# Patient Record
Sex: Male | Born: 1960
Health system: Southern US, Community
[De-identification: ages and names within clinical notes are randomized; demographics above are authoritative.]

## PROBLEM LIST (undated history)

## (undated) DIAGNOSIS — I10 Essential (primary) hypertension: Secondary | ICD-10-CM

## (undated) DIAGNOSIS — I519 Heart disease, unspecified: Secondary | ICD-10-CM

## (undated) DIAGNOSIS — I209 Angina pectoris, unspecified: Secondary | ICD-10-CM

## (undated) DIAGNOSIS — E7800 Pure hypercholesterolemia, unspecified: Secondary | ICD-10-CM

## (undated) DIAGNOSIS — M1990 Unspecified osteoarthritis, unspecified site: Secondary | ICD-10-CM

## (undated) DIAGNOSIS — M359 Systemic involvement of connective tissue, unspecified: Secondary | ICD-10-CM

## (undated) DIAGNOSIS — G4730 Sleep apnea, unspecified: Secondary | ICD-10-CM

## (undated) HISTORY — PX: APPENDECTOMY: SHX54

## (undated) HISTORY — PX: OTHER SURGICAL HISTORY: SHX169

## (undated) HISTORY — PX: EYE SURGERY: SHX253

## (undated) HISTORY — PX: CARDIAC CATHETERIZATION: SHX172

## (undated) HISTORY — DX: Heart disease, unspecified: I51.9

---

## 2004-02-20 ENCOUNTER — Inpatient Hospital Stay (HOSPITAL_COMMUNITY): Admission: EM | Admit: 2004-02-20 | Discharge: 2004-02-22 | Payer: Self-pay | Admitting: *Deleted

## 2004-03-01 ENCOUNTER — Observation Stay (HOSPITAL_COMMUNITY): Admission: EM | Admit: 2004-03-01 | Discharge: 2004-03-01 | Payer: Self-pay

## 2005-05-27 ENCOUNTER — Emergency Department: Payer: Self-pay | Admitting: Emergency Medicine

## 2005-11-01 ENCOUNTER — Encounter: Admission: RE | Admit: 2005-11-01 | Discharge: 2005-11-01 | Payer: Self-pay | Admitting: Family Medicine

## 2008-02-21 ENCOUNTER — Emergency Department: Payer: Self-pay | Admitting: Emergency Medicine

## 2008-08-08 ENCOUNTER — Emergency Department: Payer: Self-pay | Admitting: Emergency Medicine

## 2008-08-29 ENCOUNTER — Emergency Department: Payer: Self-pay | Admitting: Emergency Medicine

## 2009-02-13 ENCOUNTER — Inpatient Hospital Stay: Payer: Self-pay | Admitting: Psychiatry

## 2010-10-08 ENCOUNTER — Inpatient Hospital Stay (HOSPITAL_COMMUNITY): Admission: EM | Admit: 2010-10-08 | Discharge: 2010-10-09 | Payer: Self-pay | Admitting: Emergency Medicine

## 2011-03-14 LAB — BASIC METABOLIC PANEL
BUN: 13 mg/dL (ref 6–23)
BUN: 9 mg/dL (ref 6–23)
CO2: 23 mEq/L (ref 19–32)
CO2: 26 mEq/L (ref 19–32)
Calcium: 8.4 mg/dL (ref 8.4–10.5)
Calcium: 8.6 mg/dL (ref 8.4–10.5)
Chloride: 102 mEq/L (ref 96–112)
Chloride: 107 mEq/L (ref 96–112)
Creatinine, Ser: 0.89 mg/dL (ref 0.4–1.5)
Creatinine, Ser: 0.92 mg/dL (ref 0.4–1.5)
GFR calc Af Amer: >60 mL/min (ref >60)
GFR calc Af Amer: >60 mL/min (ref >60)
GFR calc non Af Amer: >60 mL/min (ref >60)
GFR calc non Af Amer: >60 mL/min (ref >60)
Glucose, Bld: 101 mg/dL — ABNORMAL HIGH (ref 70–99)
Glucose, Bld: 123 mg/dL — ABNORMAL HIGH (ref 70–99)
Potassium: 3.2 mEq/L — ABNORMAL LOW (ref 3.5–5.1)
Potassium: 4 mEq/L (ref 3.5–5.1)
Sodium: 134 mEq/L — ABNORMAL LOW (ref 135–145)
Sodium: 139 mEq/L (ref 135–145)

## 2011-03-14 LAB — DIFFERENTIAL
Basophils Absolute: 0 10*3/uL (ref 0.0–0.1)
Basophils Relative: 1 % (ref 0–1)
Eosinophils Absolute: 0.3 10*3/uL (ref 0.0–0.7)
Eosinophils Relative: 4 % (ref 0–5)
Lymphocytes Relative: 32 % (ref 12–46)
Lymphs Abs: 2.5 10*3/uL (ref 0.7–4.0)
Monocytes Absolute: 0.6 10*3/uL (ref 0.1–1.0)
Monocytes Relative: 7 % (ref 3–12)
Neutro Abs: 4.4 10*3/uL (ref 1.7–7.7)
Neutrophils Relative %: 57 % (ref 43–77)

## 2011-03-14 LAB — CBC
HCT: 39.8 % (ref 39.0–52.0)
Hemoglobin: 14 g/dL (ref 13.0–17.0)
MCH: 33.4 pg (ref 26.0–34.0)
MCHC: 35.1 g/dL (ref 30.0–36.0)
MCV: 95 fL (ref 78.0–100.0)
Platelets: 244 10*3/uL (ref 150–400)
RBC: 4.19 MIL/uL — ABNORMAL LOW (ref 4.22–5.81)
RDW: 13.3 % (ref 11.5–15.5)
WBC: 7.7 10*3/uL (ref 4.0–10.5)

## 2011-03-14 LAB — LIPID PANEL
Cholesterol: 176 mg/dL (ref 0–200)
HDL: 37 mg/dL — ABNORMAL LOW (ref >39)
LDL Cholesterol: 63 mg/dL (ref 0–99)
Total CHOL/HDL Ratio: 4.8 RATIO
Triglycerides: 380 mg/dL — ABNORMAL HIGH (ref <150)
VLDL: 76 mg/dL — ABNORMAL HIGH (ref 0–40)

## 2011-03-14 LAB — D-DIMER, QUANTITATIVE: D-Dimer, Quant: 0.35 ug/mL-FEU (ref 0.00–0.48)

## 2011-03-14 LAB — POCT CARDIAC MARKERS
CKMB, poc: 1.2 ng/mL (ref 1.0–8.0)
CKMB, poc: 1.3 ng/mL (ref 1.0–8.0)
Myoglobin, poc: 50.7 ng/mL (ref 12–200)
Myoglobin, poc: 55.5 ng/mL (ref 12–200)
Troponin i, poc: <0.05 ng/mL (ref 0.00–0.09)
Troponin i, poc: <0.05 ng/mL (ref 0.00–0.09)

## 2011-03-14 LAB — CARDIAC PANEL(CRET KIN+CKTOT+MB+TROPI)
CK, MB: 2.7 ng/mL (ref 0.3–4.0)
CK, MB: 2.8 ng/mL (ref 0.3–4.0)
Relative Index: 2.3 (ref 0.0–2.5)
Relative Index: 2.5 (ref 0.0–2.5)
Total CK: 110 U/L (ref 7–232)
Total CK: 122 U/L (ref 7–232)
Troponin I: 0.01 ng/mL (ref 0.00–0.06)
Troponin I: <0.01 ng/mL (ref 0.00–0.06)

## 2011-03-14 LAB — RAPID URINE DRUG SCREEN, HOSP PERFORMED
Amphetamines: NOT DETECTED
Barbiturates: NOT DETECTED
Benzodiazepines: NOT DETECTED
Cocaine: NOT DETECTED
Opiates: NOT DETECTED
Tetrahydrocannabinol: NOT DETECTED

## 2011-03-14 LAB — CK TOTAL AND CKMB (NOT AT ARMC)
CK, MB: 3.9 ng/mL (ref 0.3–4.0)
Relative Index: 2.2 (ref 0.0–2.5)
Total CK: 176 U/L (ref 7–232)

## 2011-03-14 LAB — TROPONIN I: Troponin I: 0.01 ng/mL (ref 0.00–0.06)

## 2011-03-14 LAB — HEMOGLOBIN A1C
Hgb A1c MFr Bld: 5.4 % (ref <5.7)
Mean Plasma Glucose: 108 mg/dL (ref <117)

## 2011-05-17 NOTE — H&P (Signed)
NAME:  Daniel Crane, Daniel Crane                           ACCOUNT NO.:  [id number]   MEDICAL RECORD NO.:  [id number]                   PATIENT TYPE:  OBV   LOCATION:  6527                                 FACILITY:  [facility]   PHYSICIAN:  Quita Skye. Waldon Reining, MD             DATE OF BIRTH:  Sep 18, 1961   DATE OF ADMISSION:  02/29/2004  DATE OF DISCHARGE:                                HISTORY & PHYSICAL   HISTORY OF PRESENT ILLNESS:  Daniel Crane is a 50 year old white male who is  again admitted to [facility] for chest pain. He was hospitalized 10  days ago for a similar chest pain. Cardiac catheterization demonstrated a  50% area of narrowing in the mid portion of the left anterior descending  that appeared to involve both the left anterior descending and the ostium of  the first diagonal. It was unclear as to whether or not this lesion was  hemodynamically significant. A stress Cardiolite was performed and was  reportedly negative and no percutaneous intervention was attempted and  medical therapy was advised. The patient returned to the emergency  department this evening after experiencing an episode of chest pain, which  began earlier this evening. It occurred while he was walking at work. The  chest pain was described as a pressure located in the left anterior  parasternal area. It did not radiate. It was associated with diaphoresis but  no dyspnea or nausea. It lasted for approximately 3 to 4 hours, until he  arrived in the emergency department and was given nitroglycerin. His chest  pain is resolved at this time. There were no other exacerbating or  ameliorating factors. It appeared to be unrelated to position, meals, or  respirations. It was similar in quality to the chest pain which prompted his  admission 10 days ago. However, this time, the discomfort did not radiate.   PAST MEDICAL HISTORY:  The patient has a history of hypertension and  hyperlipidemia. There is also a strong  family history of coronary artery  disease. The patient used to smoke cigarettes but no smokes cigars. There  was no history of diabetes mellitus. There was no history of myocardial  infarction, congestive heart failure, or arrhythmia.   MEDICATIONS:  Current medications include aspirin, Toprol XL and Zocor.   ALLERGIES:  None.   SOCIAL HISTORY:  The patient is married. He has 2 children. He works in the  [employer]. He drinks alcohol  occasionally.   FAMILY HISTORY:  His father died at age 13 of a gunshot wound. He had had a  history of hypertension. Two paternal aunts died with myocardial  infarction's at age 57 and 38 respectively. His paternal grandfather died in  his 55's with an acute myocardial infarction. One paternal uncle has had  coronary artery bypass surgery. His mother is alive but has cancer.   REVIEW OF SYSTEMS:  No new problems related to his head, eyes, ears, nose,  mouth, throat, lungs, gastrointestinal system, genitourinary system, or  extremities. There is no history of neurologic or psychiatric disorder.  There is no history of fever, chills or weight loss.   PHYSICAL EXAMINATION:  VITAL SIGNS:  Blood pressure 116/79. Pulse 66 and  regular. Respiratory rate 18. Temperature 97.4.  GENERAL:  The patient was a middle aged white man in no discomfort. He was  alert, oriented, appropriate and responsive.  HEENT:  Head, eyes, nose, and mouth were normal.  NECK:  Without thyromegaly or adenopathy. Carotid pulses were palpable  bilaterally and without bruits.  CARDIAC:  Normal S1 and S2. There was no S3, S4, murmur, rub, or click.  Cardiac rhythm is regular.  CHEST:  No chest wall tenderness was noted.  LUNGS:  Clear.  ABDOMEN:  Soft and non-tender. There was no mass, hepatosplenomegaly, bruit,  distention, rebound, guarding or rigidity. Bowel sounds were normal.  RECTAL/GENITAL:  Examinations not performed as they were not  pertinent to  the reason for acute care hospitalization.  EXTREMITIES:  Without edema, deviation, or deformity. Radial and dorsalis  pedal pulses were palpable bilaterally.  NEUROLOGIC:  Brief screening neurologic survey was unremarkable.   LABORATORY DATA:  The electrocardiogram was normal.   The chest radiograph, according to the radiologist, demonstrated minimal  bibasilar atelectasis.   The initial set of cardiac markers revealed a myoglobin of 83.9, CK MB 2.7,  and troponin less than 0.05. White count was 8.4 with a hemoglobin of 15.3  and hematocrit of 44.8. The remaining studies are pending at the time of  this dictation.   IMPRESSION:  1. Chest pain rule out cardiac ischemia.  2. Coronary artery disease. Status post cardiac catheterization February 21, 2004, which revealed a 50% mid left anterior descending lesion involving     the ostium of the first diagonal. Status post subsequent stress     Cardiolite, which was reportedly negative for ischemia.  3. Hypertension.  4. Dyslipidemia.   PLAN:  1. Telemetry.  2. Serial cardiac enzymes.  3. Aspirin.  4. Heparin.  5. IV nitroglycerin.  6. Continue Toprol XL.  7. Further measures per Dr. Elsie Lincoln.                                                Quita Skye. Waldon Reining, MD    MSC/MEDQ  D:  02/29/2004  T:  03/01/2004  Job:  [id number]   cc:   Madaline Savage, M.D.  [address]  [place]  [place] [address]  Fax: [phone]

## 2011-05-17 NOTE — Cardiovascular Report (Signed)
NAME:  Daniel Crane, Daniel Crane                           ACCOUNT NO.:  [id number]   MEDICAL RECORD NO.:  [id number]                   PATIENT TYPE:  INP   LOCATION:  2029                                 FACILITY:  [facility]   PHYSICIAN:  Thereasa Solo. Little, M.D.              DATE OF BIRTH:  1961-06-07   DATE OF PROCEDURE:  02/21/2004  DATE OF DISCHARGE:                              CARDIAC CATHETERIZATION   INDICATIONS FOR TEST:  Mr. Kundrat is a 50 year old male who was admitted with  left-sided chest pain.  His EKG is normal.  His cardiac enzymes are  unremarkable.   Because of the chest pain and a strong family history of premature heart  disease, the patient was brought to the cath lab.   PROCEDURE:  The patient was prepped and draped in the usual sterile fashion,  exposing the right groin.  After applying local anesthetic with 1%  Xylocaine, the Seldinger technique was employed and the 5-French introducer  sheath was placed into the right femoral artery.  Left and right coronary  arteriography and ventriculography in the RAO projection were performed.   COMPLICATIONS:  None.   EQUIPMENT:  The 5-French Judkins configuration catheters.   RESULTS:   HEMODYNAMIC MONITORING:  Central aortic pressure 133/90; left ventricular  pressure 134/15 with no significant valve gradient noted at the time of  pullback.   VENTRICULOGRAPHY:  Ventriculography in the RAO projection was associated  with marked ectopy.  There was normal wall motion.  Ejection fraction was  greater than 55%.  The end-diastolic pressure measured at 17.   CORONARY ARTERIOGRAPHY:  No calcification was seen on fluoroscopy.   1. Left main:  Normal.  2. LAD:  The LAD extended down to the apex of the heart and gave rise to one     large diagonal branch.  At the bifurcation of the LAD and the diagonal     was an area of approximately 50% narrowing that appeared to involve both     the LAD and the ostium of the first diagonal.  It was  best seen in an LAO     cranial view.  Multiple angiograms were taken of this discrete area.     Intracoronary nitroglycerin did not result in any change in the     appearance of this.  The remainder of the LAD and the diagonal were free     of disease.  3. Right coronary artery:  This was a large dominant vessel that was about 4     mm in diameter.  There was a large PDA and 2 large posterolateral     branches free of disease.  4. Circumflex:  The circumflex in the A-V groove was small after OM #2.  OM     #1 was a large vessel and free of disease.  OM #2 was small and free of     disease.  CONCLUSION:  1. Normal left ventricular systolic function.  2. Fifty percent area of narrowing in the midportion of the left anterior     descending that appears to involve both the left anterior descending and     the ostium of the first diagonal.   DISCUSSION:  My concern is whether or not the above-mentioned lesion is  actually significant or not.  It is in that gray zone and because of this, I  have ordered a stress Cardiolite study.  If it is clearly positive, then he  will need percutaneous intervention, which would be relatively high risk  because of the bifurcation involvement.  I discussed this with the patient  and the patient is scheduled for a Cardiolite in the morning.   Because of his recurrent problems of back discomfort, he was given the  combination of Versed and Nubain during the procedure with good relief of  his back pain.                                               Thereasa Solo. Little, M.D.    ABL/MEDQ  D:  02/21/2004  T:  02/22/2004  Job:  [id number]   cc:   Loma Sender  [address]  [place]  [place] [address]  Fax: [phone]   Madaline Savage, M.D.  [phone] [address], [address]  [place]  [place] [address]  Fax: [phone]   [facility]

## 2011-05-17 NOTE — Discharge Summary (Signed)
NAME:  Daniel Crane, Daniel Crane                           ACCOUNT NO.:  [id number]   MEDICAL RECORD NO.:  [id number]                   PATIENT TYPE:  INP   LOCATION:  2029                                 FACILITY:  [facility]   PHYSICIAN:  Mary B. Easley, P.A.-C.             DATE OF BIRTH:  09-26-1961   DATE OF ADMISSION:  02/20/2004  DATE OF DISCHARGE:  02/22/2004                                 DISCHARGE SUMMARY   ADMISSION DIAGNOSES:  1. Unstable angina.  2. Premature family history of coronary artery disease.  3. History of appendectomy.  4. History of prostatitis.   DISCHARGE DIAGNOSES:  1. Unstable angina.  2. Premature family history of coronary artery disease.  3. History of appendectomy.  4. History of prostatitis.  5. Status post cardiac catheterization on February 20, 2004, by Dr. Caprice Kluver. He was found to have approximately 50% stenosis with bifurcation     of the left anterior descending and diagonal. Dr. Clarene Duke plans a follow-     up Cardiolite to assess whether this is ischemic or not.  6. Status post follow-up Cardiolite scan on February 22, 2004, with no     ischemia. Plans for continued medical therapy.   HISTORY OF PRESENT ILLNESS:  Mr. Foots is s 50 year old white married male  with a strong family history of premature CAD with two aunts deceased of MI  at ages 40 and 64.  Paternal grandchild deceased with MI. The patient  herself had no prior cardiac history. However, the day prior to admission  about 2:30 p.m. he developed left anterior chest pain with radiation down  his left arm. At the time of his presentation he was experiencing left hand  numbness. There was no radiation to the back. He has been diaphoretic all  night. There was no nausea, but some shortness of breath.  Because he had  felt tight through the left anterior chest, says it felt like a boot was  pressing into his chest. He noted it had become worse throughout the day of  admission, therefore he came  to the ER.   On exam at that point his blood pressure was stable at 139/89 with heart  rate 89. Oxygen saturation is 96% on room air. No significant abnormalities  on physical exam. Initial cardiac enzymes are negative.   EKG shows no significant abnormality.   At this point he was seen and evaluated by Dr. Lavonne Chick. He was placed on  IV heparin, IV nitroglycerin. We will check serial enzymes to rule out MI.  Will also add aspirin and beta blockers. Plan for CT scan to rule out  pulmonary embolism. It was felt that he will ultimately be catheterized and  followed as well.   On February 20, 2004, Mr. Weigel remained stable. At that point he was having  no further chest pain and his pain had been responsive  to nitroglycerin. At  that point he remained stable and we planned to proceed with cardiac  catheterization.   On February 20, 2004, Mr. Errington underwent cardiac catheterization by Dr. Caprice Kluver.  He was found to have stenosis of approximately 50% of the  bifurcation of the LAD, diagonal which involves both the LAD and ostium of  diagonal. Dr. Clarene Duke felt that he needed to get a follow-up Cardiolite scan  to assess for ischemia in that territory. If it was positive, he will need  intervention, if negative, continue medical therapy.   On February 22, 2004, Mr. Faller underwent Cardiolite scan. This showed no  ischemia. Therefore, the patient was ultimately discharged home with plans  for continued medical therapy. At that time he had been hemodynamically  stable. His groin site was stable without ecchymosis, hematoma, or bleed. He  was deemed stable for discharge home with Dr. Yates Decamp.   HOSPITAL CONSULTATIONS:  None.   HOSPITAL PROCEDURES:  1. Cardiac catheterization on February 21, 2004, by Dr. Caprice Kluver. See     dictated report for details. He was found to have an approximately 50% at     the bifurcation of the LAD and the diagonal. Plan for follow-up     Cardiolite scan  to evaluate for ischemia in the territory. No other     significant disease. Normal LV function.  2. Cardiolite scan on February 22, 2004, that showed no ischemia. It was     subsequently decided that he would be stable for discharge home.   LABORATORY DATA:  Cardiac markers were negative.  TSH normal at 3.915. Lipid  profile shows total cholesterol of 196, triglycerides 209, HDL 38, LDL 116.  Cardiac enzymes are negative with CK of 181, 121, and 116.  CK-MB 3.1, 2.4,  and 2.5. Troponin less than 0.01 and 0.01. Magnesium is 2.2. Liver function  tests normal. Sodium 137, potassium 3.5, glucose 99, BUN 9, creatinine 0.9.  These remained at the time of discharge post catheterization. At that time  the creatinine was stable at 1.2. Protime is normal at 12.2, INR 0.9, PTT  31. White 7.8, hemoglobin 15.3, hematocrit 44.6, platelet count 285,000. D-  dimer remained stable with no significant variation.   He underwent a CT scan of the chest on February 20, 2004, that showed no  evidence for long segment DVT. No evidence for acute pulmonary embolus.   Nuclear medicine on February 22, 2004, shows EF of 54%. No perfusion  defects.   EKG shows normal sinus rhythm with nonspecific ST-T changes.   DISCHARGE MEDICATIONS:  1. Aspirin 81 mg once a day.  2. Toprol XL 25 mg once a day.  3. Zocor 20 mg each night.   No strenuous activity, lifting over 5 pounds, driving, or sexual activity  for three days.   Low cholesterol diet.   The patient is to call [phone] for any bleeding or swelling in the groin  site.   He is to call the office on the following day at [phone] to make an  appointment to see Dr. Elsie Lincoln in two weeks.                                                Mary B. Remer Macho, P.A.-C.    MBE/MEDQ  D:  03/26/2004  T:  03/26/2004  Job:  [id number]   cc:   Madaline Savage, M.D.  [address]  [place]  [place] [address]  Fax: [phone]  Loma Sender  [address]   [place]   [address]  Fax: [phone]

## 2011-05-17 NOTE — Discharge Summary (Signed)
NAME:  Daniel Crane, Daniel Crane                           ACCOUNT NO.:  [id number]   MEDICAL RECORD NO.:  [id number]                   PATIENT TYPE:  OBV   LOCATION:  6527                                 FACILITY:  [facility]   PHYSICIAN:  Madaline Savage, M.D.             DATE OF BIRTH:  1961-03-11   DATE OF ADMISSION:  02/29/2004  DATE OF DISCHARGE:  03/01/2004                                 DISCHARGE SUMMARY   DISCHARGE DIAGNOSES:  1. Noncardiac chest pain likely secondary to stress.  2. Mild single vessel coronary disease.  3. Anxiety and stress.  4. Hypertension.  5. Hyperlipidemia.   DISCHARGE CONDITION:  Improved.   PROCEDURE:  None.   DISCHARGE MEDICATIONS:  1. Baby aspirin 81 mg daily.  2. Toprol as before.  3. Zocor as before.  4. Protonix 40 mg daily.   DISCHARGE INSTRUCTIONS:  1. May return to work [employer].  2. Low fat, low salt diet.  3. Follow with Dr. Elsie Lincoln March 21, 2004 at 10:45 a.m.   HISTORY OF PRESENT ILLNESS:  A 50 year old white married male who was just  admitted to [facility] in February for similar chest pain. Cardiac  catheterization at that time demonstrated 50% narrowing of the mid portion  of the LAD that appeared to involve both the LAD and the ostium of the first  diagonal. Stress Cardiolite was performed and was reported negative, and no  intervention was undertaken.   The patient did well until February 29, 2004 when he returned to the emergency  room after an episode of chest pain which began earlier in the evening. He  was walking at work. Chest pain was described as a pressure located in the  LAD parasternal area. It did not radiate. Associated with diaphoresis but no  dyspnea or nausea. Lasted three to four hours until he arrived at the  emergency room and was given nitroglycerin. His chest pain resolved.   PAST MEDICAL HISTORY:  1. Hypertension.  2. Hyperlipidemia.  3. Strong family history of coronary disease.  4. He has a history of remote  tobacco but does smoke cigars.  5. No diabetes. No MI. No congestive failure.   OUTPATIENT MEDICATIONS:  1. Aspirin.  2. Toprol.  3. Zocor.   ALLERGIES:  None.   SOCIAL HISTORY, FAMILY HISTORY, REVIEW OF SYSTEMS:  See H&P.   PHYSICAL EXAMINATION AT DISCHARGE:  VITAL SIGNS:  Blood pressure 114/64,  respirations 16, temperature 96.6.  LUNGS:  Clear.  HEART:  Regular rate and rhythm without murmur. Pulses are equal.   Please note, CT of the chest had been done and no pulmonary emboli and no  lower extremity DVT was noted.   LABORATORY DATA:  CK-MBs were negative, and troponin I was negative:  Troponin I less than 0.01, CK 139, MB 1.8.   LFTs were normal. His SGPT was slightly up at 59. Magnesium 2.2. Sodium 139,  potassium 3.7, BUN 12, creatinine 1.1. Coags were normal. CBC:  Hemoglobin  15.3, hematocrit 44.8, WBC 8.4.   EKG:  Sinus rhythm, normal EKG.   HOSPITAL COURSE:  Mr. Sanor came in to the ER on February 29, 2004 and admitted  to the hospital on March 01, 2004 early morning. After rest, pain was gone,  enzymes were negative. Cardiac catheterization had just been completed a  week before, and it showed noncritical coronary disease with a negative  Cardiolite study. Will add Protonix to his medical regimen to see if that  will assist with his pain. He will follow up with Dr. Elsie Lincoln.      Darcella Gasman. In[place], N.P.                     Madaline Savage, M.D.    LRI/MEDQ  D:  03/01/2004  T:  03/02/2004  Job:  [id number]   cc:   Loma Sender  [address]  [place]  [place] [address]  Fax: [phone]

## 2011-05-17 NOTE — H&P (Signed)
NAME:  Daniel Crane, Daniel Crane                           ACCOUNT NO.:  [id number]   MEDICAL RECORD NO.:  [id number]                   PATIENT TYPE:  INP   LOCATION:  2029                                 FACILITY:  [facility]   PHYSICIAN:  Darcella Gasman. [place], N.P.               DATE OF BIRTH:  [birth year]   DATE OF ADMISSION:  02/20/2004  DATE OF DISCHARGE:                                HISTORY & PHYSICAL   CHIEF COMPLAINT:  Chest pain.   HISTORY OF PRESENT ILLNESS:  A 50 year old white married male with a strong  family history of premature coronary disease including two paternal aunts  who died with MIs ages 86 and 73, paternal grandfather died with an MI.  On  Mar 01, 2004, he developed chest pain around 2:30 p.m.  He had been using fence  post diggers the day previous.  He did develop left anterior chest pain with  radiation down his left arm and some numbness in his left hand.  No  radiation to his back.  He was diaphoretic all of last night.  No nausea.  Some shortness of breath.  He felt like the shortness of breath was related  to not being able to take a deep breath in secondary to the pressure on his  chest like a boot was pressing into his chest.  The pain had gotten worse  throughout the day, so he came to the emergency room at the encouragement of  Dr. Vear Clock, his primary doctor who notified us.   PAST MEDICAL HISTORY:  No hypertension, no diabetes, no coronary disease.  He has had status post appendectomy and recent prostatitis on Levaquin.   ALLERGIES:  No known drug allergies.   OUTPATIENT MEDICATIONS:  Levaquin.   FAMILY HISTORY:  Father died at 36 of gunshot wound, he had a history of  hypertension at that time.  Two paternal aunts died with MI at 84 and 26.  Paternal grandfather died in his 35s with an MI, one paternal uncle has had  bypass grafting and does have peripheral vascular disease.  Mother is alive  and in the hospital with cancer.  Two half brothers with hypertension,  one  sister with hypertension.   SOCIAL HISTORY:  Married, two children ages 19 and 32.  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  Does not exercise.  Occasional cigar or pipe use.  Occasional alcohol.   REVIEW OF SYSTEMS:  GENERAL:  No recent weight loss or gain.  GU:  Recent  prostatitis on antibiotics.  GI:  Very mild indigestion, has not had to take  anything for it though.  Goes away on it's own.  No melena.  No nausea,  vomiting, diarrhea, constipation.  NEUROLOGICAL:  No syncope.  ENDOCRINE:  No diabetes or thyroid disease.  SLEEP:  He does do a lot of snoring, raises  the  roof to quote the patient.  He has about a 20 inch neck.  He continues  to have his tonsils and adenoids present.  CARDIOVASCULAR:  Chest pain as  described.  LUNGS:  Negative.  MUSCULOSKELETAL:  Negative.   PHYSICAL EXAMINATION:  VITAL SIGNS:  Blood pressure is 139/89, pulse 79;  respirations 20; temperature 98.5, room air oxygen saturation 96%.  GENERAL:  Alert and oriented white male.  No acute distress.  SKIN:  Warm and dry.  Brisk capillary refill.  HEENT:  Unremarkable.  Sclerae clear.  NECK:  Supple.  No JVD.  No bruits.  HEART:  S1 and S2.  Regular rate and rhythm without murmur.  LUNGS:  Clear without rales, rhonchi or wheezes.  ABDOMEN:  Positive bowel sounds.  Soft, nontender.  EXTREMITIES:  Without edema.  Pedals 2+.  CHEST WALL:  Positive tenderness to palpation.   LABORATORY DATA:  Myoglobin 52.6, CK-MB 2.2, troponin I less than 0.05,  other labs were pending at that time, but have returned within normal  limits.   IMPRESSION:  1. Unstable angina, rule out MI.  2. Prostatitis.  3. Strong family history of coronary disease.   EKG sinus rhythm, unremarkable.  Portable chest x-ray showed cardiomegaly,  but most likely related to not full upright position and no active disease.   PLAN:  IV heparin, nitroglycerin, serial CK-MBs and cardiac  catheterization  on 02/21/04.  Dr. Elsie Lincoln saw him and assessed him with me.  We will also do  a CT scan to evaluate for a right dissection of pulmonary embolus.                                                Darcella Gasman. Annie Paras, N.P.    LRI/MEDQ  D:  02/20/2004  T:  02/20/2004  Job:  [id number]   cc:   Madaline Savage, M.D.  [address], [address]  [place]  [place] [address]  Fax: [phone]   Vear Clock, M.D.  Adline Peals

## 2011-07-25 DIAGNOSIS — R197 Diarrhea, unspecified: Secondary | ICD-10-CM | POA: Insufficient documentation

## 2011-07-25 DIAGNOSIS — R112 Nausea with vomiting, unspecified: Secondary | ICD-10-CM | POA: Insufficient documentation

## 2011-07-25 DIAGNOSIS — K5289 Other specified noninfective gastroenteritis and colitis: Secondary | ICD-10-CM | POA: Insufficient documentation

## 2011-07-25 DIAGNOSIS — R509 Fever, unspecified: Secondary | ICD-10-CM | POA: Insufficient documentation

## 2011-07-26 ENCOUNTER — Emergency Department (HOSPITAL_COMMUNITY)
Admission: EM | Admit: 2011-07-26 | Discharge: 2011-07-26 | Disposition: A | Discharge status: Home / Self Care | Payer: Self-pay | Attending: Emergency Medicine | Admitting: Emergency Medicine

## 2011-07-26 ENCOUNTER — Emergency Department (HOSPITAL_COMMUNITY)
Admit: 2011-07-26 | Discharge: 2011-07-26 | Disposition: A | Discharge status: Home / Self Care | Payer: Self-pay | Attending: Emergency Medicine | Admitting: Emergency Medicine

## 2011-07-26 MED ORDER — ONDANSETRON HCL 4 MG/2ML IJ SOLN
4.0000 mg | Freq: Once | INTRAMUSCULAR | Status: AC
  Administered 2011-07-26: 4 mg via INTRAVENOUS

## 2011-07-26 MED ORDER — IOHEXOL 300 MG/ML  SOLN
100.0000 mL | Freq: Once | INTRAMUSCULAR | Status: AC | PRN
  Administered 2011-07-26: 100 mL via INTRAVENOUS

## 2011-07-26 MED ORDER — PROMETHAZINE HCL 25 MG PO TABS: 25.0000 mg | ORAL_TABLET | Freq: Four times a day (QID) | ORAL | Status: AC | PRN

## 2011-07-26 MED ORDER — MORPHINE SULFATE 4 MG/ML IJ SOLN
4.0000 mg | Freq: Once | INTRAMUSCULAR | Status: AC
  Administered 2011-07-26: 4 mg via INTRAVENOUS

## 2011-07-26 MED ORDER — HYDROCODONE-ACETAMINOPHEN 5-325 MG PO TABS: 1.0000 | ORAL_TABLET | ORAL | Status: AC | PRN

## 2011-07-26 MED ORDER — METRONIDAZOLE 500 MG PO TABS: 500.0000 mg | ORAL_TABLET | Freq: Two times a day (BID) | ORAL | Status: AC

## 2011-07-26 NOTE — ED Notes (Signed)
Pt was to be taken over for xray and Dessa Phi, went to verify pt's name and birthdate and was found to be wrong person; pt brought back over to ED and new pt was entered into computer and xrays re-entered

## 2011-07-26 NOTE — H&P (Signed)
ED Provider Notes       ED Provider Notes signed by Laray Anger, DO  07/26/11 1610    Author: Laray Anger, DO Service: (none) Author Type: Physician    Filed: 07/26/11 0218 Note Time: 07/25/11 2354               History         Chief Complaint   Patient presents with   .  Fever   .  Diarrhea      HPI   Pt was seen at 2255.  Per pt and family, c/o gradual onset and persistence of constant generalized body aches/fatigue, generalized abd "pain," chills, as well as multiple episodes of N/V/D since this morning.  Pt describes his abd pain as "cramping."  Describes his stool as "watery."  Denies black or blood in stools or emesis, denies back pain, no CP/SOB, no cough, no rash.          Past Medical History   Diagnosis  Date   .  MI (myocardial infarction)     .  Coronary artery disease     .  High cholesterol           Past Surgical History   Procedure  Date   .  Appendectomy          No family history on file.    History   Substance Use Topics   .  Smoking status:  Current Everyday Smoker       Types:  Pipe   .  Smokeless tobacco:  Not on file   .  Alcohol Use:  Yes         occasional            Review of Systems ROS: Statement: All systems negative except as marked or noted in the HPI; Constitutional: Negative for fever,  +chills. ; ; Eyes: Negative for eye pain and discharge. ; ; ENMT: Negative for ear pain, hoarseness, nasal congestion, sinus pressure and sore throat. ; ; Cardiovascular: Negative for chest pain, palpitations, diaphoresis, dyspnea and peripheral edema. ; ; Respiratory: Negative for cough, wheezing and stridor. ; ; Gastrointestinal: +nausea, vomiting, diarrhea and abdominal pain. ; ; Genitourinary: Negative for dysuria, flank pain and hematuria. ; ; Musculoskeletal: Negative for back pain and neck pain. ; ; Skin: Negative for rash and skin lesion. ; ; Neuro: Negative for headache, lightheadedness and neck stiffness. ;         Physical Exam    BP 134/73  Pulse 89  Temp(Src) 102.5 F (39.2 C) (Oral)  Resp 16  Ht 6\' 4"  (1.93 m)  Wt 300 lb (136.079 kg)  BMI 36.52 kg/m2  SpO2 96%     Physical Exam 2300:   Physical examination:  Nursing notes reviewed; Vital signs and O2 SAT reviewed; Constitutional: +febrile.  Well developed, Well nourished, Well hydrated, In no acute distress; Head:  Normocephalic, atraumatic; Eyes: EOMI, PERRL, No scleral icterus; ENMT: Mouth and pharynx normal, Mucous membranes moist; Neck: Supple, Full range of motion, No lymphadenopathy; Cardiovascular: Regular rate and rhythm, No murmur, rub, or gallop; Respiratory: Breath sounds clear & equal bilaterally, No rales, rhonchi, wheezes, or rub, Normal respiratory effort/excursion; Chest: Nontender, Movement normal; Abdomen: Soft, +generalized TTP, no rebound or guarding.  Nondistended, Normal bowel sounds; Genitourinary: No CVA tenderness; Extremities: Pulses normal, No tenderness, No edema, No calf edema or asymmetry.; Neuro: AA&Ox3, Major CN grossly intact.  No gross focal motor or  sensory deficits in extremities.; Skin: Color normal, Warm, Dry        ED Course    Procedures   MDM   MDM Reviewed: nursing note and vitals Interpretation: labs     Results for orders placed during the hospital encounter of 07/25/11   CBC       Component  Value  Range     WBC  12.4 (*)  4.0 - 10.5 (K/uL)     RBC  4.62   4.22 - 5.81 (MIL/uL)     Hemoglobin  15.0   13.0 - 17.0 (g/dL)     HCT  16.1   09.6 - 52.0 (%)     MCV  93.7   78.0 - 100.0 (fL)     MCH  32.5   26.0 - 34.0 (pg)     MCHC  34.6   30.0 - 36.0 (g/dL)     RDW  04.5   40.9 - 15.5 (%)     Platelets  225   150 - 400 (K/uL)   DIFFERENTIAL       Component  Value  Range     Neutrophils Relative  85 (*)  43 - 77 (%)     Neutro Abs  10.6 (*)  1.7 - 7.7 (K/uL)     Lymphocytes Relative  7 (*)  12 - 46 (%)     Lymphs Abs  0.8   0.7 - 4.0 (K/uL)     Monocytes Relative  8   3 -  12 (%)     Monocytes Absolute  0.9   0.1 - 1.0 (K/uL)     Eosinophils Relative  0   0 - 5 (%)     Eosinophils Absolute  0.0   0.0 - 0.7 (K/uL)     Basophils Relative  0   0 - 1 (%)     Basophils Absolute  0.0   0.0 - 0.1 (K/uL)   COMPREHENSIVE METABOLIC PANEL       Component  Value  Range     Sodium  133 (*)  135 - 145 (mEq/L)     Potassium  3.7   3.5 - 5.1 (mEq/L)     Chloride  98   96 - 112 (mEq/L)     CO2  20   19 - 32 (mEq/L)     Glucose, Bld  111 (*)  70 - 99 (mg/dL)     BUN  12   6 - 23 (mg/dL)     Creatinine, Ser  8.11   0.50 - 1.35 (mg/dL)     Calcium  9.4   8.4 - 10.5 (mg/dL)     Total Protein  8.0   6.0 - 8.3 (g/dL)     Albumin  3.7   3.5 - 5.2 (g/dL)     AST  23   0 - 37 (U/L)     ALT  27   0 - 53 (U/L)     Alkaline Phosphatase  62   39 - 117 (U/L)     Total Bilirubin  0.7   0.3 - 1.2 (mg/dL)     GFR calc non Af Amer  >60   >60 (mL/min)     GFR calc Af Amer  >60   >60 (mL/min)   LACTIC ACID, PLASMA       Component  Value  Range     Lactic Acid, Venous  0.7   0.5 - 2.2 (mmol/L)  LIPASE, BLOOD       Component  Value  Range     Lipase  21   11 - 59 (U/L)   URINALYSIS, ROUTINE W REFLEX MICROSCOPIC       Component  Value  Range     Color, Urine  YELLOW   YELLOW      Appearance  HAZY (*)  CLEAR      Specific Gravity, Urine  >1.030 (*)  1.005 - 1.030      pH  5.5   5.0 - 8.0      Glucose, UA  NEGATIVE   NEGATIVE (mg/dL)     Hgb urine dipstick  LARGE (*)  NEGATIVE      Bilirubin Urine  SMALL (*)  NEGATIVE      Ketones, ur  NEGATIVE   NEGATIVE (mg/dL)     Protein, ur  NEGATIVE   NEGATIVE (mg/dL)     Urobilinogen, UA  0.2   0.0 - 1.0 (mg/dL)     Nitrite  NEGATIVE   NEGATIVE      Leukocytes, UA  NEGATIVE   NEGATIVE    URINE MICROSCOPIC-ADD ON       Component  Value  Range     Squamous Epithelial / LPF  RARE   RARE      WBC, UA  0-2   <3 (WBC/hpf)     RBC / HPF  7-10   <3 (RBC/hpf)     Bacteria, UA  FEW (*)  RARE      Urine-Other  MUCOUS PRESENT              2:12 AM:  Pt apparently misregistered.  Will need revision of chart by Registration before all dx testing can be completed/resulted.  Will sign out to Dr. Colon Branch.                MCMANUS,KATHLEEN Allison Quarry, DO 07/26/11 [phone]

## 2011-07-26 NOTE — ED Notes (Signed)
Daniel Crane, Daniel Crane #[id number] ([id number])  [phone] y.o. M)   PCP: None  NONE        ED Arrival Information       Expected Arrival Acuity Means of Arrival Escorted By Service Admission Type Arrival Complaint    - 07/25/2011 21:22 [facility] Emergency Medicine Emergency Fever      Chief Complaint     Fever      Diarrhea        ED Treatment Team       Provider Role From To Phone Pager    Laray Anger, DO Attending Provider 07/25/11 2246 07/26/11 0272 [phone]      Nicoletta Dress. Colon Branch, MD Attending Provider 07/26/11 [phone] -- [phone]      Katheren Puller, [place] Technician 07/25/11 2243 07/26/11 0104        Rogene Houston, RN Registered Nurse 07/25/11 [phone] -- [phone]        ED Notes report     Go to ED Notes   Dictations     None      ED Diagnosis     Final diagnosis    Other and unspecified noninfectious gastroenteritis and colitis        ED Disposition     Discharge Sharlyn Bologna discharge to home/self care.   Condition at discharge: improved      ED Patient Care Timeline report     Go to ED Patient Care Timeline   Pre-Arrival Vitals       Date and Time   PTA Temp   PTA Temp Src   PTA Pulse   PTA Resp   PTA SPO2   PTA BP   PTA Glucose Who    07/26/11 0330   --   --   --   --   96   --   -- TST    07/26/11 0132   --   --   --   --   94   --   -- [facility]    07/25/11 2154   --   --   --   --   96   --   -- RWG         Lab Results          Lipase, blood (Final result)   Component (Lab Inquiry)      Collection Time LIPASE    07/25/11 2248 21         Comprehensive metabolic panel (Final result) Abnormal Component (Lab Inquiry)      Collection Time NA K CL CO2 GLUCOSE BUN Creatinine, Ser CALCIUM PROTEIN Albumin    07/25/11 2248 133 (L) 3.7 98 20 111 (H) 12 0.87 9.4 8.0 3.7           Collection Time AST ALT ALK PHOS BILI INDIR GFR calc non Af Amer GFR calc Af Amer    07/25/11 2248 23 27 62 0.7 >60 >60        The eGFR has been  calculated using the MDRD equation. This calculation has not been validated in all clinical situations. eGFR's persistently <60 mL/min signify possible Chronic Kidney Disease.         Lactic acid, plasma (Final result)   Component (Lab Inquiry)      Collection Time Lactic Acid, Venous    07/25/11 2248 0.7         Urine microscopic-add on (Final result) Abnormal Component (Lab Inquiry)  Collection Time Squamous Epithelial / LPF WBC U RBC / HPF BACTERIA Urine-Other    07/25/11 2311 RARE 0-2 7-10 FEW (A) MUCOUS PRESENT         Urinalysis, Routine w reflex microscopic (Final result) Abnormal Component (Lab Inquiry)      Collection Time Color, Urine Appearance Specific Gravity, Urine pH GLUCOSE U Hgb urine dipstick BILI UR Ketones, ur Protein, ur Urobilinogen, UA    07/25/11 2311 YELLOW HAZY (A) >1.030 (H) 5.5 NEGATIVE LARGE (A) SMALL (A) NEGATIVE NEGATIVE 0.2           Collection Time Nitrite LEUKOCYTES    07/25/11 2311 NEGATIVE NEGATIVE         CBC (Final result) Abnormal Component (Lab Inquiry)      Collection Time WBC RBC HGB HCT MCV MCH MCHC RDW PLT    07/25/11 2248 12.4 (H) 4.62 15.0 43.3 93.7 32.5 34.6 13.3 225         Differential (Final result) Abnormal Component (Lab Inquiry)      Collection Time NEUTRO PCT AB LYM LYMPHO PCT AB LYM MONO PCT MONO ABS EOS PCT EOSINO ABS BASOS PCT BASOS ABS    07/25/11 2248 85 (H) 10.6 (H) 7 (L) 0.8 8 0.9 0 0.0 0 0.0      Imaging Results     None      EKG Results     None        Medication Administration from 07/25/2011 2121 to 07/26/2011 1478         Date/Time Order Dose Route Action Action by Comments      07/25/2011 2215 acetaminophen (TYLENOL) 500 MG tablet  mg   Given Jeanmarie Plant, RN        07/25/2011 2337 0.9 %  sodium chloride infusion   Intravenous New [address] [place], [place]        07/25/2011 2337 ondansetron (ZOFRAN) injection 4 mg 4 mg Intravenous Given Rogene Houston, RN        07/25/2011 2336  morphine injection 4 mg 4 mg Intravenous Given Rogene Houston, RN        ED Prescriptions       Medication Sig Dispense Start Date End Date Auth. Provider    metroNIDAZOLE (FLAGYL) 500 MG tablet Take 1 tablet (500 mg total) by mouth 2 (two) times daily. 14 tablet 07/26/2011 08/05/2011 Nicoletta Dress. Colon Branch, MD    promethazine (PHENERGAN) 25 MG tablet Take 1 tablet (25 mg total) by mouth every 6 (six) hours as needed for nausea (Use 1/2 - 1 pill every six hours as needed for nausea). 15 tablet 07/26/2011 08/02/2011 Nicoletta Dress. Colon Branch, MD    HYDROcodone-acetaminophen (NORCO) 5-325 MG per tablet Take 2 tablets by mouth every 4 (four) hours as needed for pain. 20 tablet 07/26/2011 08/05/2011 Nicoletta Dress. Colon Branch, MD      ED Medication Orders  Expand  [facility]     Status Ordering Provider    07/26/11 0400    metroNIDAZOLE (FLAGYL) tablet 500 mg Once    Discontinue   Ordered STRAND, TERRY S.    07/25/11 2316    ondansetron (ZOFRAN) injection 4 mg As needed    Discontinue   Last MAR action:  Given [facility], KATHLEEN M    07/25/11 2316    morphine injection 4 mg As needed    Discontinue   Last MAR action:  Given MCMANUS, KATHLEEN M    07/25/11 2300  0.9 % sodium chloride infusion Continuous    Discontinue   Last MAR action:  [facility], KATHLEEN M    07/25/11 2205     acetaminophen (TYLENOL) 500 MG tablet   Comments: Dareen Piano, ALLISON: cabinet ove...   Last MAR action:  Given             Code,Iso,Restraint     None      ED Imaging Orders  Hide        Start     Status Ordering Provider    07/25/11 2248    CT Abdomen Pelvis W Contrast 1 time imaging, Status: Canceled     Canceled Laray Anger    07/25/11 2248    DG Abd Acute W/Chest 1 time imaging    Discontinue   Acknowledged MCMANUS, Magnus Sinning           ED Micro, Lab, POCT  Hide        Start     Status Ordering Provider    07/25/11 2311    Urine microscopic-add on Once    Completed   Final result MCMANUS, KATHLEEN  M    07/25/11 2248    CBC STAT    Completed   Final result MCMANUS, KATHLEEN M    07/25/11 2248    Differential STAT    Completed   Final result MCMANUS, KATHLEEN M    07/25/11 2248    Comprehensive metabolic panel Once    Completed   Final result MCMANUS, KATHLEEN M    07/25/11 2248    Lactic acid, plasma STAT    Completed   Final result MCMANUS, KATHLEEN M    07/25/11 2248    Lipase, blood STAT    Completed   Final result MCMANUS, KATHLEEN M    07/25/11 2248    Urinalysis, Routine w reflex microscopic STAT    Completed   Final result MCMANUS, Magnus Sinning    07/25/11 2248    Urine culture STAT     In process [facility], Magnus Sinning           ED All Other Orders  Expand  Hide        Start     Status Ordering Provider    07/26/11 0215    Vital signs Once    Completed  Discontinue   Completed Laray Anger    07/25/11 2248     DIET NPO Diet effective now    Discontinue   Acknowledged [facility], Magnus Sinning           Discharge Orders  Expand  Hide        Start     Status Ordering User    07/26/11 0000     metroNIDAZOLE (FLAGYL) 500 MG tablet 2 times daily    Discontinue  Reprint   Ordered STRAND, TERRY    07/26/11 0000     promethazine (PHENERGAN) 25 MG tablet Every 6 hours PRN    Discontinue  Reprint   Ordered STRAND, TERRY    07/26/11 0000     HYDROcodone-acetaminophen (NORCO) 5-325 MG per tablet Every 4 hours PRN    Discontinue  Reprint   Ordered STRAND, TERRY           Allergies (verified on: 07/25/11)     (No Known Allergies)          Immunization History as of 07/26/2011  Never Reviewed    No immunizations on file.  Tetanus Up To Date     None      Medical History       Past Medical History Date Comments    MI (myocardial infarction) [604540]        Coronary artery disease [166850]        High cholesterol [981191]          Surgical History       Past Surgical History Last Occurrence Comments    Appendectomy [SHX54]          Social History        Category History    Smoking Tobacco Use Current Everyday Smoker; Types: Pipe    Smokeless Tobacco Use Unknown    Tobacco Comment      Alcohol Use Yes; (occasional)    Drug Use No    Sexual Activity      ADL Not Asked      Active PICC Line / CVC Line / PIV Line / Drain / Airway / Intraosseous Line / Epidural Line / ART Line / Line / Wound / Pressure Ulcer       Peripheral IV 07/25/11 Left Antecubital    Placement date   07/25/11  Site   Antecubital     Placement time   2334  Days   <1              ED Vitals       Date and Time   Temp   Pulse   Resp   BP   SpO2   Weight Who    07/26/11 0330   99.1 (37.3)   81   --   146/82   96   -- TST    07/26/11 0132   --   80   20   124/65   94   -- ACH    07/25/11 2154   102.5 (39.2)   89   16   134/73   96   300 lb (136.079 kg) RWG       Height and Weight       Date and Time   Height   Height Method   Weight   Weight Method Who    07/25/11 2154   6\' 4"  (1.93 m)   Stated   300 lb (136.079 kg)   Stated RWG      Oxygen Therapy       Date and Time   SpO2   FiO2 (%)   O2 Device   O2 Flow Rate (L/min) Who    07/26/11 0330   96   --   None (Room air)   -- TST    07/26/11 0132   94   --   None (Room air)   -- [facility]    07/25/11 2154   96   --   None (Room air)   -- RWG      Pain Assessment       Date and Time   Pain Level                       Faces Pain Scale       Pain Location   Pain Orientation   Pain Radiating Towards   Pain Descriptors   Pain Frequency   Pain Onset   Clinical Progression   Effect of Pain on Daily Activities   Patients Stated Pain Goal   Pain Intervention(s)   Response to Interventions  Multiple Pain Sites Who    07/25/11 2159   --   --   --   --   --   --   --   --   Generalized   --   c/o body aches; states, "I hurt from my head to my toes"   Aching   Constant   --   --   --   --   --   --   -- AWA           Suicide Risk       Date and Time   Is patient at risk  for suicide?   Charting Type   Attempted suicide within last 30 days?   Substance abuse history and/or treatment for substance abuse?   Attempting or threatening harm to self or others?   Expressing thoughts of harming self or others without intent?   Recently experienced and/or been treated for a psychiatric disorder, including depression or anxiety?   Expressing Hopelessness?   Mood not consistent with state of illness?   Fear of detention/extended hospitalization?   Coping with recent loss/disruption in support system? Who    07/25/11 2202   No   Initial   --   No   --   --   No   No   --   --   No AWA            Airway       Date and Time   Airway (WDL)   Obstructed?   Interventions to Clear Airway Who    07/25/11 2334   WDL   --   -- TST    07/25/11 2200   WDL   --   -- AWA      Breathing       Date and Time   Breathing (WDL)   Chest Assessment   Respiratory Pattern   L Breath Sounds   R Breath Sounds   SpO2 Who    07/26/11 0330   --   --   --   --   --   96 TST    07/26/11 0132   --   --   --   --   --   94 [facility]    07/25/11 2334   WDL   --   --   --   --   -- TST    07/25/11 2200   WDL   --   --   --   --   -- AWA    07/25/11 2154   --   --   --   --   --   96 RWG      Circulation       Date and Time   Circulation (WDL)   L Radial Pulse   R Radial Pulse   Heart Rhythm   Capillary Refill   Color Who    07/25/11 2334   WDL   --   --   --   --   -- TST    07/25/11 2200   WDL   --   --   --   --   -- AWA      Disability       Date and Time   Disability (WDL)   LOC   History of LOC?   History of Neurological Trauma?   Sudden Onset of Severe Headache?   Glasgow Coma 5+ - Eye Opening  Glasgow Coma 5+ - Motor Response   Glasgow Coma 5+ - Verbal Response   Glasgow Coma Scale 5+ - Total Score   L Pupil Size (mm)   R Pupil Size (mm)   L Pupil Reaction   R Pupil Reaction Who    07/25/11 2334   WDL   --   --   --   --   --   --   --   --   --   --    --   -- TST    07/25/11 2200   WDL   --   --   --   --   --   --   --   --   --   --   --   -- AWA                                 Respiratory       Date and Time   Respiratory (WDL)   Respiratory (WDL)   Bilateral Breath Sounds   L Breath Sounds   R Breath Sounds   Respiratory Pattern   Chest Assessment   O2 Device Who    07/26/11 0330   --   --   --   --   --   --   --   None (Room air) TST    07/26/11 0132   --   --   --   --   --   --   --   None (Room air) [facility]    07/25/11 2154   --   --   --   --   --   --   --   None (Room air) RWG                               ABG Procedure       Date and Time   Allen's Test #1   Site #1   MAU Facility Charges   Specimen Status #1   Allen's Test #2   Site #2       Specimen Status #2   O2 Flow Rate (L/min)   FiO2 (%)   O2 Device   How tolerated? Who    07/26/11 0330   --   --   --   --   --   --   --   --   --   --   None (Room air)   -- TST    07/26/11 0132   --   --   --   --   --   --   --   --   --   --   None (Room air)   -- [facility]    07/25/11 2154   --   --   --   --   --   --   --   --   --   --   None (Room air)   -- [facility]                                  ED Events       Date/Time Event User Comments    07/25/11 2121 Patient expected in ED [place], DEBRA H  07/25/11 2122 Patient arrived in ED [facility], [place] Denis      07/25/11 2126 Registration Completed Donella Stade      07/25/11 2158 Triage Started Vergia Alcon      07/25/11 2202 Triage Completed Vergia Alcon      07/25/11 2239 Patient roomed in ED Vergia Alcon      07/25/11 2246 Assign Attending MCMANUS, Laray Anger, K assigned as Attending    07/25/11 2246 Assign Physician [facility], Magnus Sinning      07/26/11 0050 Ready for Transport Neldon Mc S      07/26/11 0104 Team Member Removed Delphia Grates, J removed as Technician    07/26/11 [phone] Assign  Attending MCMANUS, Abby Potash, T assigned as Attending    07/26/11 0219 Remove Attending MCMANUS, Laray Anger, K removed as Attending    07/26/11 0219 Assign Physician [facility], Magnus Sinning      07/26/11 0237 Patient transferred to Hinda Lenis S      07/26/11 0237 Patient transferred Neldon Mc S        Follow-up Information       Follow up With Details Comments Contact Info        Return to the ER if symptoms worsen        Discharge Instructions     Bland diet for the next 8-10 hours then progress as tolerated. Use the medicines as directed. If your symptoms worsen, return to the ER.Colitis Colitis is inflammation of the colon. Colitis can be a short-term or long-standing (chronic) illness. Crohn's disease and ulcerative colitis are 2 types of colitis which are chronic. They usually require lifelong treatment. CAUSES There are many different causes of colitis, including: Viruses.  Germs (bacteria).  Medicine reactions.  SYMPTOMS Diarrhea. Intestinal bleeding.   Pain.   Fever.    Throwing up (vomiting). Tiredness (fatigue).   Weight loss.   Bowel blockage.      DIAGNOSIS The diagnosis of colitis is based on examination and stool or blood tests. X-rays, CT scan, and colonoscopy may also be needed. TREATMENT Treatment may include: Fluids given through the vein (intravenously). Bowel rest (nothing to eat or drink for a period of time).    Medicine for pain and diarrhea. Medicines (antibiotics) that kill germs.   Cortisone medicines.   Surgery.      HOME CARE INSTRUCTIONS Get plenty of rest.  Drink enough water and fluids to keep your urine clear or pale yellow.  Eat a well-balanced diet.  Call your caregiver for follow-up as recommended.  SEEK IMMEDIATE MEDICAL CARE IF: You develop chills.  You have an oral temperature above 101 not controlled by medicine.  You have extreme weakness, fainting, or dehydration.  You have repeated vomiting.  You  develop severe belly (abdominal) pain or are passing bloody or tarry stools.  MAKE SURE YOU: Understand these instructions.  Will watch your condition.  Will get help right away if you are not doing well or get worse.  Document Released: 01/23/2005 Document Re-Released: 06/05/2010 [facility] Patient Information 2011 [place], [place].      Discharge References/Attachments     None        MD Documentation          Most Recent Value    Section 1: Physician Certification      Patient Condition   --    Reason for Transfer   --    Relationship to patient   --  Benefits of Transfer   --    Risks of Transfer   --    Accepting Physician   --    Sending Physician   --    Physician Reassessment   --      RN Documentation          Most Recent Value    Section 2: Nurse Certification      Accepting hospital or facility   --    Accepting Hospital   --    Transfer Coordinator - name, #   --    Report to Next Provider   --    Date report called   --    Time report called   --    Transport By   --    Date transport service arrived   --    Time transport service arrived   --    Date transferred   --    Time transferred   --    Level of Care   --    Copies of Medical Records Sent   --    Patient Belongings Disposition   --      Vitals for Transfer          Most Recent Value    E - Vitals (15 min before transfer)      Temp   99.1 F (37.3 C)    ECG Heart Rate   --    Pulse Rate   81     Resp   20     BP   146/82 mmHg    IV Fluids, Additives, Rate   --    O2 (L/min)   --    EMTALA ORDERS   --

## 2011-07-26 NOTE — Treatment Plan (Signed)
Daniel Crane. Colon Branch, MD Physician Signed  Progress Notes 07/26/2011 4:07 AM   Results from CT and acute abdominal series in patient who presented with fever and abdominal pain. Patient has a colitis. Results have been reviewed with patient. He has been given metronidazole. Vital signs are stable. He is ready for discharge home.     CT Abdomen Pelvis W Contrast (Final result)   Result time:07/26/11 [phone]     Final result by Rad Results In Interface (07/26/11 02:48:52)     Narrative:     *RADIOLOGY REPORT*  Clinical Data: .  CT ABDOMEN AND PELVIS WITH CONTRAST  Technique: Multidetector CT imaging of the abdomen and pelvis was performed following the standard protocol during bolus administration of intravenous contrast.  Contrast: 100 ml Omnipaque-300  Comparison: Contemporaneous radiograph  Findings: Bibasilar scarring versus atelectasis. Normal heart size. Coronary artery calcification. No pericardial or pleural effusions.  Low attenuation of the liver is in keeping with fatty infiltration. Unremarkable biliary system, spleen, pancreas, and adrenal glands. The kidneys symmetrically enhance. No hydronephrosis or urinary tract calculi.  Large bowel diverticulosis without CT evidence for diverticulitis. Status post appendectomy. There is mild fat stranding surrounding the cecum. Small bowel loops are of normal course and caliber. No mesenteric or retroperitoneal lymphadenopathy. No free intraperitoneal air or fluid. Small fat containing umbilical hernia.  There is scattered atherosclerotic calcification of the aorta and its branches. No aneurysmal dilatation.  Multilevel degenerative changes of the imaged spine. No acute or aggressive appearing osseous lesion.  IMPRESSION: Mild pericecal fat stranding, may represent a nonspecific focal colitis (infectious, inflammatory, ischemic etiologies).  Original Report Authenticated By: Waneta Martins, M.D.             DG Abd Acute  W/Chest (Final result)   Result time:07/26/11 [id number]     Final result by Rad Results In Interface (07/26/11 02:43:57)     Narrative:     *RADIOLOGY REPORT*  Clinical Data: Nausea, vomiting, diarrhea.  ACUTE ABDOMEN SERIES (ABDOMEN 2 VIEW & CHEST 1 VIEW)  Comparison: None.  Findings: Mild bibasilar scarring versus atelectasis. No focal consolidation otherwise. No pneumothorax. No pleural effusion. Cardiomediastinal contours are within normal limits.  Nonobstructive bowel gas pattern. Organ outlines are normal where seen. Multilevel degenerative changes without aggressive appearing osseous lesion.  IMPRESSION: Bibasilar scarring versus atelectasis.  Nonobstructive bowel gas pattern.  Original Report

## 2015-04-11 ENCOUNTER — Emergency Department (HOSPITAL_COMMUNITY)
Admission: EM | Admit: 2015-04-11 | Discharge: 2015-04-11 | Disposition: A | Discharge status: Home / Self Care | Payer: Self-pay | Attending: Emergency Medicine | Admitting: Emergency Medicine

## 2015-04-11 ENCOUNTER — Emergency Department (HOSPITAL_COMMUNITY): Payer: Self-pay

## 2015-04-11 ENCOUNTER — Encounter (HOSPITAL_COMMUNITY): Payer: Self-pay

## 2015-04-11 DIAGNOSIS — M47896 Other spondylosis, lumbar region: Secondary | ICD-10-CM

## 2015-04-11 DIAGNOSIS — M1990 Unspecified osteoarthritis, unspecified site: Secondary | ICD-10-CM | POA: Insufficient documentation

## 2015-04-11 DIAGNOSIS — M5136 Other intervertebral disc degeneration, lumbar region: Secondary | ICD-10-CM | POA: Insufficient documentation

## 2015-04-11 HISTORY — DX: Unspecified osteoarthritis, unspecified site: M19.90

## 2015-04-11 MED ORDER — BACLOFEN 10 MG PO TABS: 10.0000 mg | ORAL_TABLET | Freq: Three times a day (TID) | ORAL | Status: AC

## 2015-04-11 MED ORDER — KETOROLAC TROMETHAMINE 10 MG PO TABS
10.0000 mg | ORAL_TABLET | Freq: Once | ORAL | Status: AC
  Administered 2015-04-11: 10 mg via ORAL
  Filled 2015-04-11: qty 1

## 2015-04-11 MED ORDER — ONDANSETRON HCL 4 MG PO TABS
4.0000 mg | ORAL_TABLET | Freq: Once | ORAL | Status: AC
  Administered 2015-04-11: 4 mg via ORAL
  Filled 2015-04-11: qty 1

## 2015-04-11 MED ORDER — DEXAMETHASONE 4 MG PO TABS: 4.0000 mg | ORAL_TABLET | Freq: Two times a day (BID) | ORAL | Status: DC

## 2015-04-11 MED ORDER — TRAMADOL HCL 50 MG PO TABS: ORAL_TABLET | ORAL | Status: DC

## 2015-04-11 MED ORDER — DIAZEPAM 5 MG PO TABS
10.0000 mg | ORAL_TABLET | Freq: Once | ORAL | Status: AC
  Administered 2015-04-11: 10 mg via ORAL
  Filled 2015-04-11: qty 2

## 2015-04-11 MED ORDER — HYDROCODONE-ACETAMINOPHEN 5-325 MG PO TABS
2.0000 | ORAL_TABLET | Freq: Once | ORAL | Status: AC
  Administered 2015-04-11: 2 via ORAL
  Filled 2015-04-11: qty 2

## 2015-04-11 NOTE — ED Notes (Signed)
Pt reports to ED w/ c/o of lower back pain.  Pt states that pain began on 04/07/15 and has progressed until this morning when he was unable to walk.  Pt states that he took 2 Tylenol and 1 Vicodin with no relief.  Pt states that the pain is more concentrated on his left side.  Pt denies any incontinence or other urinary/bowel symptoms.

## 2015-04-11 NOTE — ED Provider Notes (Signed)
CSN: [id number]     Arrival date & time 04/11/15  4320 History   First MD Initiated Contact with Patient 04/11/15 [phone]     Chief Complaint  Patient presents with  . Back Pain     (Consider location/radiation/quality/duration/timing/severity/associated sxs/prior Treatment) Patient is a 54 y.o. male presenting with back pain. The history is provided by the patient.  Back Pain Location:  Lumbar spine Quality:  Aching and shooting Radiates to:  L thigh Pain severity:  Moderate Pain is:  Same all the time Onset quality:  Gradual Duration:  4 days Timing:  Intermittent Progression:  Worsening Context: lifting heavy objects   Context: not falling and not recent injury   Relieved by:  Nothing Worsened by:  Movement Ineffective treatments:  Narcotics Associated symptoms: tingling   Associated symptoms: no bladder incontinence, no bowel incontinence and no perianal numbness     No past medical history on file. No past surgical history on file. No family history on file. History  Substance Use Topics  . Smoking status: Not on file  . Smokeless tobacco: Not on file  . Alcohol Use: Not on file    Review of Systems  Gastrointestinal: Negative for bowel incontinence.  Genitourinary: Negative for bladder incontinence.  Musculoskeletal: Positive for back pain and arthralgias.  Neurological: Positive for tingling.      Allergies  Review of patient's allergies indicates not on file.  Home Medications   Prior to Admission medications   Not on File   There were no vitals taken for this visit. Physical Exam  Constitutional: He is oriented to person, place, and time. He appears well-developed and well-nourished.  Non-toxic appearance.  HENT:  Head: Normocephalic.  Right Ear: Tympanic membrane and external ear normal.  Left Ear: Tympanic membrane and external ear normal.  Eyes: EOM and lids are normal. Pupils are equal, round, and reactive to light.  Neck: Normal range of  motion. Neck supple. Carotid bruit is not present.  Cardiovascular: Normal rate, regular rhythm, normal heart sounds, intact distal pulses and normal pulses.   Pulmonary/Chest: Breath sounds normal. No respiratory distress.  Abdominal: Soft. Bowel sounds are normal. There is no tenderness. There is no guarding.  Musculoskeletal:       Lumbar back: He exhibits decreased range of motion, pain and spasm.  No palpable step off of the cervical, thoracic, or lumbar spine area.  No hot areas.  Lymphadenopathy:       Head (right side): No submandibular adenopathy present.       Head (left side): No submandibular adenopathy present.    He has no cervical adenopathy.  Neurological: He is alert and oriented to person, place, and time. He has normal strength. No cranial nerve deficit or sensory deficit.  No motor/sensory deficit of the lower extremities.  Skin: Skin is warm and dry.  Psychiatric: He has a normal mood and affect. His speech is normal.  Nursing note and vitals reviewed.   ED Course  Procedures (including critical care time) Labs Review Labs Reviewed - No data to display  Imaging Review No results found.   EKG Interpretation None      MDM  Vital signs are well within normal limits. No gross neurologic deficit appreciated on examination. Patient has pain with change of position and also with palpation of the lumbar area.  X-ray of the lumbar spine reveals multilevel disc disease withnarrowing. There is endplate spur formation noted. There is also a vacuum phenomena at the L5-S1 area. There  is some scoliosis present.  The plan at this time is for the patient to be treated with Decadron, baclofen, and Ultram. Patient is referred to orthopedics for evaluation concerning his back. I have reviewed the x-rays and findings with the patient in terms which he understands and he is in agreement with this discharge plan.    Final diagnoses:  None    *I have reviewed nursing notes,  vital signs, and all appropriate lab and imaging results for this patient.[address], PA-C 04/11/15 4628  Merryl Hacker, MD 04/11/15 2017

## 2015-04-11 NOTE — Discharge Instructions (Signed)
Your x-ray reveals some arthritis throughout your lumbar spine. It also reveals disc space narrowing, possibly consistent with degenerative disc disease. No gross neurologic deficits appreciated on your examination today. Please discuss the x-ray and the symptoms you are having with your primary physician as sone as possible. Your doctor may want to consider an MRI for more definitive evaluation and diagnosis concerning your back. Use of a heating pad may be helpful. Please rest your back is much as possible. Please use baclofen and Decadron daily. Use Ultram for pain if needed. Baclofen and Ultram may cause drowsiness, please use with caution. Please take these medications with food. Degenerative Disk Disease Degenerative disk disease is a condition caused by the changes that occur in the cushions of the backbone (spinal disks) as you grow older. Spinal disks are soft and compressible disks located between the bones of the spine (vertebrae). They act like shock absorbers. Degenerative disk disease can affect the whole spine. However, the neck and lower back are most commonly affected. Many changes can occur in the spinal disks with aging, such as:  The spinal disks may dry and shrink.  Small tears may occur in the tough, outer covering of the disk (annulus).  The disk space may become smaller due to loss of water.  Abnormal growths in the bone (spurs) may occur. This can put pressure on the nerve roots exiting the spinal canal, causing pain.  The spinal canal may become narrowed. CAUSES  Degenerative disk disease is a condition caused by the changes that occur in the spinal disks with aging. The exact cause is not known, but there is a genetic basis for many patients. Degenerative changes can occur due to loss of fluid in the disk. This makes the disk thinner and reduces the space between the backbones. Small cracks can develop in the outer layer of the disk. This can lead to the breakdown of the  disk. You are more likely to get degenerative disk disease if you are overweight. Smoking cigarettes and doing heavy work such as weightlifting can also increase your risk of this condition. Degenerative changes can start after a sudden injury. Growth of bone spurs can compress the nerve roots and cause pain.  SYMPTOMS  The symptoms vary from person to person. Some people may have no pain, while others have severe pain. The pain may be so severe that it can limit your activities. The location of the pain depends on the part of your backbone that is affected. You will have neck or arm pain if a disk in the neck area is affected. You will have pain in your back, buttocks, or legs if a disk in the lower back is affected. The pain becomes worse while bending, reaching up, or with twisting movements. The pain may start gradually and then get worse as time passes. It may also start after a major or minor injury. You may feel numbness or tingling in the arms or legs.  DIAGNOSIS  Your caregiver will ask you about your symptoms and about activities or habits that may cause the pain. He or she may also ask about any injuries, diseases, or treatments you have had earlier. Your caregiver will examine you to check for the range of movement that is possible in the affected area, to check for strength in your extremities, and to check for sensation in the areas of the arms and legs supplied by different nerve roots. An X-ray of the spine may be taken. Your caregiver may  suggest other imaging tests, such as magnetic resonance imaging (MRI), if needed.  TREATMENT  Treatment includes rest, modifying your activities, and applying ice and heat. Your caregiver may prescribe medicines to reduce your pain and may ask you to do some exercises to strengthen your back. In some cases, you may need surgery. You and your caregiver will decide on the treatment that is best for you. HOME CARE INSTRUCTIONS   Follow proper lifting and  walking techniques as advised by your caregiver.  Maintain good posture.  Exercise regularly as advised.  Perform relaxation exercises.  Change your sitting, standing, and sleeping habits as advised. Change positions frequently.  Lose weight as advised.  Stop smoking if you smoke.  Wear supportive footwear. SEEK MEDICAL CARE IF:  Your pain does not go away within 1 to 4 weeks. SEEK IMMEDIATE MEDICAL CARE IF:   Your pain is severe.  You notice weakness in your arms, hands, or legs.  You begin to lose control of your bladder or bowel movements. MAKE SURE YOU:   Understand these instructions.  Will watch your condition.  Will get help right away if you are not doing well or get worse. Document Released: 10/13/2007 Document Revised: 03/09/2012 Document Reviewed: 04/19/2014 [facility] Patient Information 2015 [place], [place]. This information is not intended to replace advice given to you by your health care provider. Make sure you discuss any questions you have with your health care provider.

## 2015-08-10 ENCOUNTER — Telehealth: Payer: Self-pay

## 2015-08-10 NOTE — Telephone Encounter (Signed)
Pt's wife called to set up patient's colonoscopy. They had received a letter from DS. Please return call to [phone]

## 2015-08-14 ENCOUNTER — Telehealth: Payer: Self-pay

## 2015-08-14 NOTE — Telephone Encounter (Signed)
Triaged for 09/25/2015 and will have to update triage.

## 2015-08-14 NOTE — Telephone Encounter (Signed)
Pts wife called back today to set up husband's tcs. I told her that DS would be calling her back today that last week she was covering for another nurse that was out and she was aware that she had called. Wife asked if husband could schedule colonoscopy for next Monday since he will be on vacation next week and could do his prep over the weekend. Please call [phone]

## 2015-08-15 ENCOUNTER — Other Ambulatory Visit: Payer: Self-pay

## 2015-08-15 DIAGNOSIS — Z1211 Encounter for screening for malignant neoplasm of colon: Secondary | ICD-10-CM

## 2015-08-15 NOTE — Addendum Note (Signed)
Addended by: Everardo All on: 08/15/2015 02:37 PM   Modules accepted: Medications

## 2015-08-18 NOTE — Addendum Note (Signed)
Addended by: Everardo All on: 08/18/2015 08:10 AM   Modules accepted: Medications

## 2015-08-18 NOTE — Telephone Encounter (Signed)
Gastroenterology Pre-Procedure Review  Request Date: 08/14/2015 Requesting Physician: Dr. Hilma Favors  PATIENT REVIEW QUESTIONS: The patient responded to the following health history questions as indicated:    1. Diabetes Melitis: no 2. Joint replacements in the past 12 months: no 3. Major health problems in the past 3 months: no 4. Has an artificial valve or MVP: no 5. Has a defibrillator: no 6. Has been advised in past to take antibiotics in advance of a procedure like teeth cleaning: no    MEDICATIONS & ALLERGIES:    Patient reports the following regarding taking any blood thinners:   Plavix? no Aspirin? no Coumadin? no  Patient confirms/reports the following medications:  Current Outpatient Prescriptions  Medication Sig Dispense Refill  . acetaminophen (TYLENOL) 500 MG tablet Take 1,000 mg by mouth every 6 (six) hours as needed for moderate pain.    . traZODone (DESYREL) 100 MG tablet Take 100 mg by mouth at bedtime.    Marland Kitchen aspirin EC 81 MG tablet Take 81 mg by mouth daily.    Marland Kitchen dexamethasone (DECADRON) 4 MG tablet Take 1 tablet (4 mg total) by mouth 2 (two) times daily with a meal. (Patient not taking: Reported on 08/15/2015) 12 tablet 0  . HYDROcodone-acetaminophen (NORCO/VICODIN) 5-325 MG per tablet Take 1 tablet by mouth every 6 (six) hours as needed for moderate pain.    . traMADol (ULTRAM) 50 MG tablet 1 or 2 po q6h prn pain (Patient not taking: Reported on 08/18/2015) 20 tablet 0   No current facility-administered medications for this visit.    Patient confirms/reports the following allergies:  No Known Allergies  No orders of the defined types were placed in this encounter.    AUTHORIZATION INFORMATION Primary Insurance:   ID #:  Group #:  Pre-Cert / Auth required:  Pre-Cert / Auth #:   Secondary Insurance:   ID #:   Group #:  Pre-Cert / Auth required:  Pre-Cert / Auth #:   SCHEDULE INFORMATION: Procedure has been scheduled as follows:  Date:  09/25/2015              Time: 7:30 Location: [facility] Short Stay  This Gastroenterology Pre-Precedure Review Form is being routed to the following provider(s): R. Garfield Cornea, MD

## 2015-08-22 NOTE — Telephone Encounter (Signed)
See separate triage.  

## 2015-08-23 NOTE — Telephone Encounter (Signed)
Patient will need OV due to polypharmacy (Trazadone, Ultram, and Vicodin) and likely need for propofolMAC. Please notify patient.

## 2015-08-24 NOTE — Telephone Encounter (Signed)
Pt is aware he needs OV appt. Scheduled for 09/13/2015 at 8:30 Am with Laban Emperor, NP.  I have taken off of the schedule for 09/25/2015 and Hoyle Sauer is aware in Endo.

## 2015-09-13 ENCOUNTER — Ambulatory Visit (INDEPENDENT_AMBULATORY_CARE_PROVIDER_SITE_OTHER): Payer: 59 | Admitting: Gastroenterology

## 2015-09-13 ENCOUNTER — Encounter: Payer: Self-pay | Admitting: Gastroenterology

## 2015-09-13 ENCOUNTER — Other Ambulatory Visit: Payer: Self-pay

## 2015-09-13 VITALS — BP 148/85 | HR 69 | Temp 98.3°F | Ht 76.0 in | Wt 305.0 lb

## 2015-09-13 DIAGNOSIS — Z1211 Encounter for screening for malignant neoplasm of colon: Secondary | ICD-10-CM | POA: Insufficient documentation

## 2015-09-13 MED ORDER — PEG 3350-KCL-NA BICARB-NACL 420 G PO SOLR: 4000.0000 mL | ORAL | Status: DC

## 2015-09-13 NOTE — Progress Notes (Signed)
    Primary Care Physician:  Collene Mares, PA-C Primary Gastroenterologist:  Dr. Gala Romney   Chief Complaint  Patient presents with  . set up TCS    HPI:   Daniel Crane is a 54 y.o. male presenting today for a visit prior to initial screening colonoscopy. He has no concerning lower GI symptoms to include changes in bowel habits, rectal bleeding, abdominal pain. No upper GI symptoms of note. Brought in to assess for sedation requirements due to history of routine ETOH use. Drinks about 2 cans of beer a few times a week.      Past Medical History  Diagnosis Date  . Arthritis   . Heart disease     catheterization in remote past, medically managed    Past Surgical History  Procedure Laterality Date  . Appendectomy    . Cardiac catheterization      Current Outpatient Prescriptions  Medication Sig Dispense Refill  . traZODone (DESYREL) 100 MG tablet Take 100 mg by mouth at bedtime.     No current facility-administered medications for this visit.    Allergies as of 09/13/2015  . (No Known Allergies)    Family History  Problem Relation Age of Onset  . Colon cancer Neg Hx     Social History   Social History  . Marital Status: Married    Spouse Name: N/A  . Number of Children: N/A  . Years of Education: N/A   Occupational History  . Not on file.   Social History Main Topics  . Smoking status: Light Tobacco Smoker    Types: Pipe  . Smokeless tobacco: Not on file  . Alcohol Use: 1.2 oz/week    2 Cans of beer per week     Comment: 2-3 times per week   . Drug Use: No  . Sexual Activity: Not on file   Other Topics Concern  . Not on file   Social History Narrative    Review of Systems: Gen: Denies any fever, chills, fatigue, weight loss, lack of appetite.  CV: Denies chest pain, heart palpitations, peripheral edema, syncope.  Resp: Denies shortness of breath at rest or with exertion. Denies wheezing or cough.  GI: see HPI GU : Denies urinary burning, urinary  frequency, urinary hesitancy MS: +arthritis  Derm: Denies rash, itching, dry skin Psych: Denies depression, anxiety, memory loss, and confusion Heme: Denies bruising, bleeding, and enlarged lymph nodes.  Physical Exam: BP 148/85 mmHg  Pulse 69  Temp(Src) 98.3 F (36.8 C)  Ht 6\' 4"  (1.93 m)  Wt 305 lb (138.347 kg)  BMI 37.14 kg/m2 General:   Alert and oriented. Pleasant and cooperative. Well-nourished and well-developed.  Head:  Normocephalic and atraumatic. Eyes:  Without icterus, sclera clear and conjunctiva pink.  Ears:  Normal auditory acuity. Nose:  No deformity, discharge,  or lesions. Mouth:  No deformity or lesions, oral mucosa pink.  Lungs:  Clear to auscultation bilaterally. No wheezes, rales, or rhonchi. No distress.  Heart:  S1, S2 present without murmurs appreciated.  Abdomen:  +BS, soft, non-tender and non-distended. No HSM noted. No guarding or rebound. No masses appreciated. Query umbilical hernia.  Rectal:  Deferred  Msk:  Symmetrical without gross deformities. Normal posture. Extremities:  Without  edema. Neurologic:  Alert and  oriented x4;  grossly normal neurologically. Skin:  Intact without significant lesions or rashes. Psych:  Alert and cooperative. Normal mood and affect.

## 2015-09-13 NOTE — Patient Instructions (Signed)
We have scheduled you for a colonoscopy with Dr. Rourk in the near future.  Further recommendations to follow!   

## 2015-09-15 NOTE — Assessment & Plan Note (Addendum)
54 year old male with need for initial screening colonoscopy. No lower or upper GI symptoms of concern. No FH colon cancer.   Proceed with TCS with Dr. Gala Romney in near future: the risks, benefits, and alternatives have been discussed with the patient in detail. The patient states understanding and desires to proceed. Phenergan 25 mg IV on call due to alcohol use periodically through week

## 2015-09-18 NOTE — Progress Notes (Signed)
cc'ed to pcp °

## 2015-09-21 ENCOUNTER — Telehealth: Payer: Self-pay

## 2015-09-21 NOTE — Telephone Encounter (Signed)
Pt's meds have not changed and he is ready for the colonoscopy.

## 2015-09-22 NOTE — Telephone Encounter (Signed)
Noted  

## 2015-09-25 ENCOUNTER — Ambulatory Visit: Admit: 2015-09-25 | Payer: Self-pay | Admitting: Internal Medicine

## 2015-09-25 SURGERY — COLONOSCOPY
Anesthesia: Moderate Sedation

## 2015-10-09 ENCOUNTER — Encounter (HOSPITAL_COMMUNITY)
Admission: RE | Disposition: A | Discharge status: Home / Self Care | Payer: Self-pay | Source: Ambulatory Visit | Attending: Internal Medicine

## 2015-10-09 ENCOUNTER — Ambulatory Visit (HOSPITAL_COMMUNITY)
Admission: RE | Admit: 2015-10-09 | Discharge: 2015-10-09 | Disposition: A | Discharge status: Home / Self Care | Payer: 59 | Source: Ambulatory Visit | Attending: Internal Medicine | Admitting: Internal Medicine

## 2015-10-09 ENCOUNTER — Encounter (HOSPITAL_COMMUNITY): Payer: Self-pay | Admitting: *Deleted

## 2015-10-09 DIAGNOSIS — Z1211 Encounter for screening for malignant neoplasm of colon: Secondary | ICD-10-CM | POA: Insufficient documentation

## 2015-10-09 DIAGNOSIS — F17290 Nicotine dependence, other tobacco product, uncomplicated: Secondary | ICD-10-CM | POA: Insufficient documentation

## 2015-10-09 DIAGNOSIS — D123 Benign neoplasm of transverse colon: Secondary | ICD-10-CM

## 2015-10-09 DIAGNOSIS — D122 Benign neoplasm of ascending colon: Secondary | ICD-10-CM | POA: Insufficient documentation

## 2015-10-09 DIAGNOSIS — Z86010 Personal history of colonic polyps: Secondary | ICD-10-CM | POA: Insufficient documentation

## 2015-10-09 DIAGNOSIS — D125 Benign neoplasm of sigmoid colon: Secondary | ICD-10-CM | POA: Diagnosis not present

## 2015-10-09 DIAGNOSIS — K5730 Diverticulosis of large intestine without perforation or abscess without bleeding: Secondary | ICD-10-CM | POA: Diagnosis not present

## 2015-10-09 HISTORY — PX: COLONOSCOPY: SHX5424

## 2015-10-09 SURGERY — COLONOSCOPY
Anesthesia: Moderate Sedation

## 2015-10-09 MED ORDER — MIDAZOLAM HCL 5 MG/5ML IJ SOLN
INTRAMUSCULAR | Status: DC | PRN
  Administered 2015-10-09: 2 mg via INTRAVENOUS
  Administered 2015-10-09 (×2): 1 mg via INTRAVENOUS

## 2015-10-09 MED ORDER — ONDANSETRON HCL 4 MG/2ML IJ SOLN
INTRAMUSCULAR | Status: AC
  Filled 2015-10-09: qty 2

## 2015-10-09 MED ORDER — ONDANSETRON HCL 4 MG/2ML IJ SOLN
INTRAMUSCULAR | Status: DC | PRN
  Administered 2015-10-09: 4 mg via INTRAVENOUS

## 2015-10-09 MED ORDER — SODIUM CHLORIDE 0.9 % IJ SOLN
INTRAMUSCULAR | Status: AC
  Filled 2015-10-09: qty 3

## 2015-10-09 MED ORDER — PROMETHAZINE HCL 25 MG/ML IJ SOLN
25.0000 mg | Freq: Once | INTRAMUSCULAR | Status: AC
  Administered 2015-10-09: 25 mg via INTRAVENOUS

## 2015-10-09 MED ORDER — MIDAZOLAM HCL 5 MG/5ML IJ SOLN
INTRAMUSCULAR | Status: AC
  Filled 2015-10-09: qty 10

## 2015-10-09 MED ORDER — MEPERIDINE HCL 100 MG/ML IJ SOLN
INTRAMUSCULAR | Status: DC | PRN
  Administered 2015-10-09: 50 mg via INTRAVENOUS
  Administered 2015-10-09: 25 mg via INTRAVENOUS

## 2015-10-09 MED ORDER — PROMETHAZINE HCL 25 MG/ML IJ SOLN
INTRAMUSCULAR | Status: DC
Start: 2015-10-09 — End: 2015-10-09
  Filled 2015-10-09: qty 1

## 2015-10-09 MED ORDER — SODIUM CHLORIDE 0.9 % IV SOLN
INTRAVENOUS | Status: DC
  Administered 2015-10-09: 12:00:00 via INTRAVENOUS

## 2015-10-09 MED ORDER — STERILE WATER FOR IRRIGATION IR SOLN
Status: DC | PRN
  Administered 2015-10-09: 12:00:00

## 2015-10-09 MED ORDER — MEPERIDINE HCL 100 MG/ML IJ SOLN
INTRAMUSCULAR | Status: AC
  Filled 2015-10-09: qty 2

## 2015-10-09 NOTE — Discharge Instructions (Signed)
°Colonoscopy °Discharge Instructions ° °Read the instructions outlined below and refer to this sheet in the next few weeks. These discharge instructions provide you with general information on caring for yourself after you leave the hospital. Your doctor may also give you specific instructions. While your treatment has been planned according to the most current medical practices available, unavoidable complications occasionally occur. If you have any problems or questions after discharge, call Dr. Rourk at [phone]. °ACTIVITY °· You may resume your regular activity, but move at a slower pace for the next 24 hours.  °· Take frequent rest periods for the next 24 hours.  °· Walking will help get rid of the air and reduce the bloated feeling in your belly (abdomen).  °· No driving for 24 hours (because of the medicine (anesthesia) used during the test).   °· Do not sign any important legal documents or operate any machinery for 24 hours (because of the anesthesia used during the test).  °NUTRITION °· Drink plenty of fluids.  °· You may resume your normal diet as instructed by your doctor.  °· Begin with a light meal and progress to your normal diet. Heavy or fried foods are harder to digest and may make you feel sick to your stomach (nauseated).  °· Avoid alcoholic beverages for 24 hours or as instructed.  °MEDICATIONS °· You may resume your normal medications unless your doctor tells you otherwise.  °WHAT YOU CAN EXPECT TODAY °· Some feelings of bloating in the abdomen.  °· Passage of more gas than usual.  °· Spotting of blood in your stool or on the toilet paper.  °IF YOU HAD POLYPS REMOVED DURING THE COLONOSCOPY: °· No aspirin products for 7 days or as instructed.  °· No alcohol for 7 days or as instructed.  °· Eat a soft diet for the next 24 hours.  °FINDING OUT THE RESULTS OF YOUR TEST °Not all test results are available during your visit. If your test results are not back during the visit, make an appointment  with your caregiver to find out the results. Do not assume everything is normal if you have not heard from your caregiver or the medical facility. It is important for you to follow up on all of your test results.  °SEEK IMMEDIATE MEDICAL ATTENTION IF: °· You have more than a spotting of blood in your stool.  °· Your belly is swollen (abdominal distention).  °· You are nauseated or vomiting.  °· You have a temperature over 101.  °· You have abdominal pain or discomfort that is severe or gets worse throughout the day.  ° ° °Colon polyp and diverticulosis information provided ° °Further recommendations to follow pending review of pathology report ° ° °Diverticulosis °Diverticulosis is the condition that develops when small pouches (diverticula) form in the wall of your colon. Your colon, or large intestine, is where water is absorbed and stool is formed. The pouches form when the inside layer of your colon pushes through weak spots in the outer layers of your colon. °CAUSES  °No one knows exactly what causes diverticulosis. °RISK FACTORS °· Being older than 50. Your risk for this condition increases with age. Diverticulosis is rare in people younger than 40 years. By age 80, almost everyone has it. °· Eating a low-fiber diet. °· Being frequently constipated. °· Being overweight. °· Not getting enough exercise. °· Smoking. °· Taking over-the-counter pain medicines, like aspirin and ibuprofen. °SYMPTOMS  °Most people with diverticulosis do not have symptoms. °DIAGNOSIS  °  Because diverticulosis often has no symptoms, health care providers often discover the condition during an exam for other colon problems. In many cases, a health care provider will diagnose diverticulosis while using a flexible scope to examine the colon (colonoscopy). °TREATMENT  °If you have never developed an infection related to diverticulosis, you may not need treatment. If you have had an infection before, treatment may include: °· Eating more  fruits, vegetables, and grains. °· Taking a fiber supplement. °· Taking a live bacteria supplement (probiotic). °· Taking medicine to relax your colon. °HOME CARE INSTRUCTIONS  °· Drink at least 6-8 glasses of water each day to prevent constipation. °· Try not to strain when you have a bowel movement. °· Keep all follow-up appointments. °If you have had an infection before:  °· Increase the fiber in your diet as directed by your health care provider or dietitian. °· Take a dietary fiber supplement if your health care provider approves. °· Only take medicines as directed by your health care provider. °SEEK MEDICAL CARE IF:  °· You have abdominal pain. °· You have bloating. °· You have cramps. °· You have not gone to the bathroom in 3 days. °SEEK IMMEDIATE MEDICAL CARE IF:  °· Your pain gets worse. °· Your bloating becomes very bad. °· You have a fever or chills, and your symptoms suddenly get worse. °· You begin vomiting. °· You have bowel movements that are bloody or black. °MAKE SURE YOU: °· Understand these instructions. °· Will watch your condition. °· Will get help right away if you are not doing well or get worse. °  °This information is not intended to replace advice given to you by your health care provider. Make sure you discuss any questions you have with your health care provider. °  °Document Released: 09/12/2004 Document Revised: 12/21/2013 Document Reviewed: 11/10/2013 °Elsevier Interactive Patient Education ©2016 Elsevier Inc[employer] °Colon Polyps °Polyps are lumps of extra tissue growing inside the body. Polyps can grow in the large intestine (colon). Most colon polyps are noncancerous (benign). However, some colon polyps can become cancerous over time. Polyps that are larger than a pea may be harmful. To be safe, caregivers remove and test all polyps. °CAUSES  °Polyps form when mutations in the genes cause your cells to grow and divide even though no more tissue is needed. °RISK FACTORS °There are a number  of risk factors that can increase your chances of getting colon polyps. They include: °· Being older than 50 years. °· Family history of colon polyps or colon cancer. °· Long-term colon diseases, such as colitis or Crohn disease. °· Being overweight. °· Smoking. °· Being inactive. °· Drinking too much alcohol. °SYMPTOMS  °Most small polyps do not cause symptoms. If symptoms are present, they may include: °· Blood in the stool. The stool may look dark red or black. °· Constipation or diarrhea that lasts longer than 1 week. °DIAGNOSIS °People often do not know they have polyps until their caregiver finds them during a regular checkup. Your caregiver can use 4 tests to check for polyps: °· Digital rectal exam. The caregiver wears gloves and feels inside the rectum. This test would find polyps only in the rectum. °· Barium enema. The caregiver puts a liquid called barium into your rectum before taking X-rays of your colon. Barium makes your colon look white. Polyps are dark, so they are easy to see in the X-ray pictures. °· Sigmoidoscopy. A thin, flexible tube (sigmoidoscope) is placed into your rectum. The sigmoidoscope has a   light and tiny camera in it. The caregiver uses the sigmoidoscope to look at the last third of your colon. °· Colonoscopy. This test is like sigmoidoscopy, but the caregiver looks at the entire colon. This is the most common method for finding and removing polyps. °TREATMENT  °Any polyps will be removed during a sigmoidoscopy or colonoscopy. The polyps are then tested for cancer. °PREVENTION  °To help lower your risk of getting more colon polyps: °· Eat plenty of fruits and vegetables. Avoid eating fatty foods. °· Do not smoke. °· Avoid drinking alcohol. °· Exercise every day. °· Lose weight if recommended by your caregiver. °· Eat plenty of calcium and folate. Foods that are rich in calcium include milk, cheese, and broccoli. Foods that are rich in folate include chickpeas, kidney beans, and  spinach. °HOME CARE INSTRUCTIONS °Keep all follow-up appointments as directed by your caregiver. You may need periodic exams to check for polyps. °SEEK MEDICAL CARE IF: °You notice bleeding during a bowel movement. °  °This information is not intended to replace advice given to you by your health care provider. Make sure you discuss any questions you have with your health care provider. °  °Document Released: 09/11/2004 Document Revised: 01/06/2015 Document Reviewed: 02/25/2012 °Elsevier Interactive Patient Education ©2016 Elsevier Inc. ° °

## 2015-10-09 NOTE — Interval H&P Note (Signed)
History and Physical Interval Note:  10/09/2015 12:14 PM  Daniel Crane  has presented today for surgery, with the diagnosis of SCREENING  The various methods of treatment have been discussed with the patient and family. After consideration of risks, benefits and other options for treatment, the patient has consented to  Procedure(s) with comments: COLONOSCOPY (N/A) - 1230 - pt can't come earlier - transportation issues as a surgical intervention .  The patient's history has been reviewed, patient examined, no change in status, stable for surgery.  I have reviewed the patient's chart and labs.  Questions were answered to the patient's satisfaction.     Robert Rourk  No change. First ever average risk screening colonoscopy per plan.  The risks, benefits, limitations, alternatives and imponderables have been reviewed with the patient. Questions have been answered. All parties are agreeable.

## 2015-10-09 NOTE — Op Note (Signed)
[facility] [address] [place], [address]   COLONOSCOPY PROCEDURE REPORT  PATIENT: Daniel Crane, Daniel Crane  MR#: [id number] BIRTHDATE: Nov 30, 1961 , 75  yrs. old GENDER: male ENDOSCOPIST: R.  Garfield Cornea, MD FACP [facility] REFERRED AJ:GOTLXBWI Mann, PA-C PROCEDURE DATE:  2015-11-05 PROCEDURE:   Colonoscopy with biopsy and with snare polypectomy INDICATIONS:First ever average risk colorectal cancer screening examination. MEDICATIONS: Versed 4 mg IV and Demerol 75 mg IV.  Phenergan 25 mg IV and Zofran 4 mg IV. ASA CLASS:       Class II  CONSENT: The risks, benefits, alternatives and imponderables including but not limited to bleeding, perforation as well as the possibility of a missed lesion have been reviewed.  The potential for biopsy, lesion removal, etc. have also been discussed. Questions have been answered.  All parties agreeable.  Please see the history and physical in the medical record for more information.  DESCRIPTION OF PROCEDURE:   After the risks benefits and alternatives of the procedure were thoroughly explained, informed consent was obtained.  The digital rectal exam revealed no abnormalities of the rectum.   The EC-[id number] ([id number])  endoscope was introduced through the anus and advanced to the cecum, which was identified by both the appendix and ileocecal valve. No adverse events experienced.   The quality of the prep was adequate  The instrument was then slowly withdrawn as the colon was fully examined. Estimated blood loss is zero unless otherwise noted in this procedure report.      COLON FINDINGS: Normal-appearing rectal mucosa.  Scattered left-sided diverticula; (1) ascending colon polyp?"6 mm in dimension; (1) a 5 mm polyp at the splenic flexure; (1) 8 mm pedunculated polyp in the mid sigmoid segment with adjacent diminutive polyp.  The above-mentioned polyps were cold snared removed -2 ,hot snare and cold biopsy removed, respectively.   Retroflexion was performed. .  Withdrawal time=11 minutes 0 seconds.  The scope was withdrawn and the procedure completed. COMPLICATIONS: There were no immediate complications. EBL 3 mL ENDOSCOPIC IMPRESSION: Multiple colonic polyps?"removed as described above. Colonic diverticulosis.  RECOMMENDATIONS: Follow-up on pathology.  eSigned:  R. Garfield Cornea, MD [facility] Nov 05, 2015 1:01 PM   cc:  CPT CODES: ICD CODES:  The ICD and CPT codes recommended by this software are interpretations from the data that the clinical staff has captured with the software.  The verification of the translation of this report to the ICD and CPT codes and modifiers is the sole responsibility of the health care institution and practicing physician where this report was generated.  [employer] will not be held responsible for the validity of the ICD and CPT codes included on this report.  AMA assumes no liability for data contained or not contained herein. CPT is a Designer, television/film set of the [employer].  PATIENT NAME:  Daniel Crane, Daniel Crane MR#: [id number]

## 2015-10-09 NOTE — H&P (View-Only) (Signed)
    Primary Care Physician:  Collene Mares, PA-C Primary Gastroenterologist:  Dr. Gala Romney   Chief Complaint  Patient presents with  . set up TCS    HPI:   Daniel Crane is a 54 y.o. male presenting today for a visit prior to initial screening colonoscopy. He has no concerning lower GI symptoms to include changes in bowel habits, rectal bleeding, abdominal pain. No upper GI symptoms of note. Brought in to assess for sedation requirements due to history of routine ETOH use. Drinks about 2 cans of beer a few times a week.      Past Medical History  Diagnosis Date  . Arthritis   . Heart disease     catheterization in remote past, medically managed    Past Surgical History  Procedure Laterality Date  . Appendectomy    . Cardiac catheterization      Current Outpatient Prescriptions  Medication Sig Dispense Refill  . traZODone (DESYREL) 100 MG tablet Take 100 mg by mouth at bedtime.     No current facility-administered medications for this visit.    Allergies as of 09/13/2015  . (No Known Allergies)    Family History  Problem Relation Age of Onset  . Colon cancer Neg Hx     Social History   Social History  . Marital Status: Married    Spouse Name: N/A  . Number of Children: N/A  . Years of Education: N/A   Occupational History  . Not on file.   Social History Main Topics  . Smoking status: Light Tobacco Smoker    Types: Pipe  . Smokeless tobacco: Not on file  . Alcohol Use: 1.2 oz/week    2 Cans of beer per week     Comment: 2-3 times per week   . Drug Use: No  . Sexual Activity: Not on file   Other Topics Concern  . Not on file   Social History Narrative    Review of Systems: Gen: Denies any fever, chills, fatigue, weight loss, lack of appetite.  CV: Denies chest pain, heart palpitations, peripheral edema, syncope.  Resp: Denies shortness of breath at rest or with exertion. Denies wheezing or cough.  GI: see HPI GU : Denies urinary burning, urinary  frequency, urinary hesitancy MS: +arthritis  Derm: Denies rash, itching, dry skin Psych: Denies depression, anxiety, memory loss, and confusion Heme: Denies bruising, bleeding, and enlarged lymph nodes.  Physical Exam: BP 148/85 mmHg  Pulse 69  Temp(Src) 98.3 F (36.8 C)  Ht 6\' 4"  (1.93 m)  Wt 305 lb (138.347 kg)  BMI 37.14 kg/m2 General:   Alert and oriented. Pleasant and cooperative. Well-nourished and well-developed.  Head:  Normocephalic and atraumatic. Eyes:  Without icterus, sclera clear and conjunctiva pink.  Ears:  Normal auditory acuity. Nose:  No deformity, discharge,  or lesions. Mouth:  No deformity or lesions, oral mucosa pink.  Lungs:  Clear to auscultation bilaterally. No wheezes, rales, or rhonchi. No distress.  Heart:  S1, S2 present without murmurs appreciated.  Abdomen:  +BS, soft, non-tender and non-distended. No HSM noted. No guarding or rebound. No masses appreciated. Query umbilical hernia.  Rectal:  Deferred  Msk:  Symmetrical without gross deformities. Normal posture. Extremities:  Without  edema. Neurologic:  Alert and  oriented x4;  grossly normal neurologically. Skin:  Intact without significant lesions or rashes. Psych:  Alert and cooperative. Normal mood and affect.

## 2015-10-10 ENCOUNTER — Encounter (HOSPITAL_COMMUNITY): Payer: Self-pay | Admitting: Internal Medicine

## 2015-10-10 ENCOUNTER — Encounter: Payer: Self-pay | Admitting: Internal Medicine

## 2016-02-05 ENCOUNTER — Other Ambulatory Visit: Payer: Self-pay | Admitting: Gastroenterology

## 2016-02-05 DIAGNOSIS — Z205 Contact with and (suspected) exposure to viral hepatitis: Secondary | ICD-10-CM

## 2016-02-05 NOTE — Progress Notes (Signed)
Patient's wife seen as patient today for Hep C. Instructed that Daniel Crane needs to be checked as well. Orders placed.

## 2016-02-06 ENCOUNTER — Other Ambulatory Visit: Payer: Self-pay | Admitting: Gastroenterology

## 2016-02-07 LAB — HEPATITIS C ANTIBODY

## 2016-02-07 LAB — HCV RNA QUANT RFLX ULTRA OR GENOTYP: HCV QUANT BASELINE: NOT DETECTED [IU]/mL

## 2016-02-14 NOTE — Progress Notes (Signed)
Quick Note:  Please let patient know he DOES NOT have hepatitis C. Recommend barrier protection with intercourse, do not share razors/nail clippers, toothbrushes with wife until she has completed HCV treatment. ______

## 2016-04-17 NOTE — Patient Instructions (Signed)
Your procedure is scheduled on: 04/25/2016  Report to [facility] at  38  AM.  Call this number if you have problems the morning of surgery: [phone]   Do not eat food or drink liquids :After Midnight.      Take these medicines the morning of surgery with A SIP OF WATER: none   Do not wear jewelry, make-up or nail polish.  Do not wear lotions, powders, or perfumes. You may wear deodorant.  Do not shave 48 hours prior to surgery.  Do not bring valuables to the hospital.  Contacts, dentures or bridgework may not be worn into surgery.  Leave suitcase in the car. After surgery it may be brought to your room.  For patients admitted to the hospital, checkout time is 11:00 AM the day of discharge.   Patients discharged the day of surgery will not be allowed to drive home.  :     Please read over the following fact sheets that you were given: Coughing and Deep Breathing, Surgical Site Infection Prevention, Anesthesia Post-op Instructions and Care and Recovery After Surgery    Cataract A cataract is a clouding of the lens of the eye. When a lens becomes cloudy, vision is reduced based on the degree and nature of the clouding. Many cataracts reduce vision to some degree. Some cataracts make people more near-sighted as they develop. Other cataracts increase glare. Cataracts that are ignored and become worse can sometimes look white. The white color can be seen through the pupil. CAUSES   Aging. However, cataracts may occur at any age, even in newborns.   Certain drugs.   Trauma to the eye.   Certain diseases such as diabetes.   Specific eye diseases such as chronic inflammation inside the eye or a sudden attack of a rare form of glaucoma.   Inherited or acquired medical problems.  SYMPTOMS   Gradual, progressive drop in vision in the affected eye.   Severe, rapid visual loss. This most often happens when trauma is the cause.  DIAGNOSIS  To detect a cataract, an eye doctor examines  the lens. Cataracts are best diagnosed with an exam of the eyes with the pupils enlarged (dilated) by drops.  TREATMENT  For an early cataract, vision may improve by using different eyeglasses or stronger lighting. If that does not help your vision, surgery is the only effective treatment. A cataract needs to be surgically removed when vision loss interferes with your everyday activities, such as driving, reading, or watching TV. A cataract may also have to be removed if it prevents examination or treatment of another eye problem. Surgery removes the cloudy lens and usually replaces it with a substitute lens (intraocular lens, IOL).  At a time when both you and your doctor agree, the cataract will be surgically removed. If you have cataracts in both eyes, only one is usually removed at a time. This allows the operated eye to heal and be out of danger from any possible problems after surgery (such as infection or poor wound healing). In rare cases, a cataract may be doing damage to your eye. In these cases, your caregiver may advise surgical removal right away. The vast majority of people who have cataract surgery have better vision afterward. HOME CARE INSTRUCTIONS  If you are not planning surgery, you may be asked to do the following:  Use different eyeglasses.   Use stronger or brighter lighting.   Ask your eye doctor about reducing your medicine dose or  changing medicines if it is thought that a medicine caused your cataract. Changing medicines does not make the cataract go away on its own.   Become familiar with your surroundings. Poor vision can lead to injury. Avoid bumping into things on the affected side. You are at a higher risk for tripping or falling.   Exercise extreme care when driving or operating machinery.   Wear sunglasses if you are sensitive to bright light or experiencing problems with glare.  SEEK IMMEDIATE MEDICAL CARE IF:   You have a worsening or sudden vision loss.    You notice redness, swelling, or increasing pain in the eye.   You have a fever.  Document Released: 12/16/2005 Document Revised: 12/05/2011 Document Reviewed: 08/09/2011 [facility] Patient Information 2012 [place].PATIENT INSTRUCTIONS POST-ANESTHESIA  IMMEDIATELY FOLLOWING SURGERY:  Do not drive or operate machinery for the first twenty four hours after surgery.  Do not make any important decisions for twenty four hours after surgery or while taking narcotic pain medications or sedatives.  If you develop intractable nausea and vomiting or a severe headache please notify your doctor immediately.  FOLLOW-UP:  Please make an appointment with your surgeon as instructed. You do not need to follow up with anesthesia unless specifically instructed to do so.  WOUND CARE INSTRUCTIONS (if applicable):  Keep a dry clean dressing on the anesthesia/puncture wound site if there is drainage.  Once the wound has quit draining you may leave it open to air.  Generally you should leave the bandage intact for twenty four hours unless there is drainage.  If the epidural site drains for more than 36-48 hours please call the anesthesia department.  QUESTIONS?:  Please feel free to call your physician or the hospital operator if you have any questions, and they will be happy to assist you.

## 2016-04-18 ENCOUNTER — Encounter (HOSPITAL_COMMUNITY)
Admission: RE | Admit: 2016-04-18 | Discharge: 2016-04-18 | Disposition: A | Discharge status: Home / Self Care | Payer: 59 | Source: Ambulatory Visit | Attending: Ophthalmology | Admitting: Ophthalmology

## 2016-04-18 ENCOUNTER — Other Ambulatory Visit: Payer: Self-pay

## 2016-04-18 ENCOUNTER — Encounter (HOSPITAL_COMMUNITY): Payer: Self-pay

## 2016-04-18 DIAGNOSIS — Z01818 Encounter for other preprocedural examination: Secondary | ICD-10-CM | POA: Insufficient documentation

## 2016-04-18 HISTORY — DX: Pure hypercholesterolemia, unspecified: E78.00

## 2016-04-18 HISTORY — DX: Angina pectoris, unspecified: I20.9

## 2016-04-18 LAB — CBC WITH DIFFERENTIAL/PLATELET
BASOS ABS: 0 10*3/uL (ref 0.0–0.1)
Basophils Relative: 0 %
EOS ABS: 0.3 10*3/uL (ref 0.0–0.7)
EOS PCT: 4 %
HCT: 43.3 % (ref 39.0–52.0)
Hemoglobin: 14.8 g/dL (ref 13.0–17.0)
LYMPHS ABS: 1.9 10*3/uL (ref 0.7–4.0)
LYMPHS PCT: 29 %
MCH: 32.2 pg (ref 26.0–34.0)
MCHC: 34.2 g/dL (ref 30.0–36.0)
MCV: 94.3 fL (ref 78.0–100.0)
MONO ABS: 0.6 10*3/uL (ref 0.1–1.0)
Monocytes Relative: 10 %
Neutro Abs: 3.6 10*3/uL (ref 1.7–7.7)
Neutrophils Relative %: 57 %
PLATELETS: 233 10*3/uL (ref 150–400)
RBC: 4.59 MIL/uL (ref 4.22–5.81)
RDW: 13.1 % (ref 11.5–15.5)
WBC: 6.4 10*3/uL (ref 4.0–10.5)

## 2016-04-18 LAB — BASIC METABOLIC PANEL
Anion gap: 9 (ref 5–15)
BUN: 15 mg/dL (ref 6–20)
CALCIUM: 8.9 mg/dL (ref 8.9–10.3)
CO2: 24 mmol/L (ref 22–32)
CREATININE: 0.99 mg/dL (ref 0.61–1.24)
Chloride: 106 mmol/L (ref 101–111)
GFR calc Af Amer: >60 mL/min (ref >60)
GLUCOSE: 117 mg/dL — AB (ref 65–99)
Potassium: 4 mmol/L (ref 3.5–5.1)
SODIUM: 139 mmol/L (ref 135–145)

## 2016-04-18 NOTE — Progress Notes (Signed)
   04/18/16 0858  OBSTRUCTIVE SLEEP APNEA  Have you ever been diagnosed with sleep apnea through a sleep study? No  Do you snore loudly (loud enough to be heard through closed doors)?  1  Do you often feel tired, fatigued, or sleepy during the daytime (such as falling asleep during driving or talking to someone)? 0  Has anyone observed you stop breathing during your sleep? 1  Do you have, or are you being treated for high blood pressure? 0  BMI more than 35 kg/m2? 1  Age > 50 (1-yes) 1  Neck circumference greater than:Male 16 inches or larger, Male 17inches or larger? 1  Male Gender (Yes=1) 1  Obstructive Sleep Apnea Score 6  Score 5 or greater  Results sent to PCP

## 2016-04-25 ENCOUNTER — Encounter (HOSPITAL_COMMUNITY)
Admission: RE | Disposition: A | Discharge status: Home / Self Care | Payer: Self-pay | Source: Ambulatory Visit | Attending: Ophthalmology

## 2016-04-25 ENCOUNTER — Encounter (HOSPITAL_COMMUNITY): Payer: Self-pay | Admitting: *Deleted

## 2016-04-25 ENCOUNTER — Ambulatory Visit (HOSPITAL_COMMUNITY): Payer: 59 | Admitting: Anesthesiology

## 2016-04-25 ENCOUNTER — Ambulatory Visit (HOSPITAL_COMMUNITY)
Admission: RE | Admit: 2016-04-25 | Discharge: 2016-04-25 | Disposition: A | Discharge status: Home / Self Care | Payer: 59 | Source: Ambulatory Visit | Attending: Ophthalmology | Admitting: Ophthalmology

## 2016-04-25 DIAGNOSIS — Z7982 Long term (current) use of aspirin: Secondary | ICD-10-CM | POA: Diagnosis not present

## 2016-04-25 DIAGNOSIS — F17200 Nicotine dependence, unspecified, uncomplicated: Secondary | ICD-10-CM | POA: Insufficient documentation

## 2016-04-25 DIAGNOSIS — H268 Other specified cataract: Secondary | ICD-10-CM | POA: Diagnosis not present

## 2016-04-25 DIAGNOSIS — M1991 Primary osteoarthritis, unspecified site: Secondary | ICD-10-CM | POA: Insufficient documentation

## 2016-04-25 HISTORY — PX: CATARACT EXTRACTION W/PHACO: SHX586

## 2016-04-25 SURGERY — PHACOEMULSIFICATION, CATARACT, WITH IOL INSERTION
Anesthesia: Monitor Anesthesia Care | Laterality: Left

## 2016-04-25 MED ORDER — MIDAZOLAM HCL 2 MG/2ML IJ SOLN
1.0000 mg | INTRAMUSCULAR | Status: DC | PRN
Start: 2016-04-25 — End: 2016-04-25
  Administered 2016-04-25: 2 mg via INTRAVENOUS

## 2016-04-25 MED ORDER — EPINEPHRINE HCL 1 MG/ML IJ SOLN
INTRAMUSCULAR | Status: AC
  Filled 2016-04-25: qty 2

## 2016-04-25 MED ORDER — FENTANYL CITRATE (PF) 100 MCG/2ML IJ SOLN
25.0000 ug | INTRAMUSCULAR | Status: AC
  Administered 2016-04-25: 25 ug via INTRAVENOUS
  Administered 2016-04-25: 07:00:00 via INTRAVENOUS

## 2016-04-25 MED ORDER — CYCLOPENTOLATE-PHENYLEPHRINE 0.2-1 % OP SOLN
1.0000 [drp] | OPHTHALMIC | Status: AC
  Administered 2016-04-25 (×3): 1 [drp] via OPHTHALMIC

## 2016-04-25 MED ORDER — PROVISC 10 MG/ML IO SOLN
INTRAOCULAR | Status: DC | PRN
  Administered 2016-04-25: 0.85 mL via INTRAOCULAR

## 2016-04-25 MED ORDER — LIDOCAINE HCL 3.5 % OP GEL
1.0000 "application " | Freq: Once | OPHTHALMIC | Status: AC
  Administered 2016-04-25: 1 via OPHTHALMIC

## 2016-04-25 MED ORDER — LACTATED RINGERS IV SOLN
INTRAVENOUS | Status: DC
  Administered 2016-04-25: 07:00:00 via INTRAVENOUS

## 2016-04-25 MED ORDER — TETRACAINE HCL 0.5 % OP SOLN
1.0000 [drp] | OPHTHALMIC | Status: AC
  Administered 2016-04-25 (×3): 1 [drp] via OPHTHALMIC

## 2016-04-25 MED ORDER — BSS IO SOLN
INTRAOCULAR | Status: DC | PRN
  Administered 2016-04-25: 15 mL

## 2016-04-25 MED ORDER — FENTANYL CITRATE (PF) 100 MCG/2ML IJ SOLN
INTRAMUSCULAR | Status: AC
  Filled 2016-04-25: qty 2

## 2016-04-25 MED ORDER — SODIUM HYALURONATE 23 MG/ML IO SOLN
INTRAOCULAR | Status: DC | PRN
  Administered 2016-04-25: 0.6 mL via INTRAOCULAR

## 2016-04-25 MED ORDER — LIDOCAINE HCL (PF) 1 % IJ SOLN
INTRAOCULAR | Status: DC | PRN
  Administered 2016-04-25: .9 mL via OPHTHALMIC

## 2016-04-25 MED ORDER — NEOMYCIN-POLYMYXIN-DEXAMETH 3.5-10000-0.1 OP SUSP
OPHTHALMIC | Status: DC | PRN
  Administered 2016-04-25: 2 [drp] via OPHTHALMIC

## 2016-04-25 MED ORDER — EPINEPHRINE HCL 1 MG/ML IJ SOLN
INTRAOCULAR | Status: DC | PRN
  Administered 2016-04-25: 500 mL

## 2016-04-25 MED ORDER — MIDAZOLAM HCL 2 MG/2ML IJ SOLN
INTRAMUSCULAR | Status: AC
  Filled 2016-04-25: qty 2

## 2016-04-25 MED ORDER — PHENYLEPHRINE HCL 2.5 % OP SOLN
1.0000 [drp] | OPHTHALMIC | Status: AC
  Administered 2016-04-25 (×3): 1 [drp] via OPHTHALMIC

## 2016-04-25 MED ORDER — POVIDONE-IODINE 5 % OP SOLN
OPHTHALMIC | Status: DC | PRN
  Administered 2016-04-25: 1 via OPHTHALMIC

## 2016-04-25 SURGICAL SUPPLY — 17 items
CLOTH BEACON ORANGE TIMEOUT ST (SAFETY) ×2 IMPLANT
EYE SHIELD UNIVERSAL CLEAR (GAUZE/BANDAGES/DRESSINGS) ×2 IMPLANT
GLOVE BIOGEL PI IND STRL 6.5 (GLOVE) ×1 IMPLANT
GLOVE BIOGEL PI IND STRL 7.0 (GLOVE) ×1 IMPLANT
GLOVE BIOGEL PI IND STRL 7.5 (GLOVE) IMPLANT
GLOVE BIOGEL PI INDICATOR 6.5 (GLOVE) ×1
GLOVE BIOGEL PI INDICATOR 7.0 (GLOVE) ×1
GLOVE BIOGEL PI INDICATOR 7.5 (GLOVE)
GLOVE EXAM NITRILE LRG STRL (GLOVE) IMPLANT
GLOVE EXAM NITRILE MD LF STRL (GLOVE) IMPLANT
PAD ARMBOARD 7.5X6 YLW CONV (MISCELLANEOUS) ×2 IMPLANT
SIGHTPATH CAT PROC W REG LENS (Ophthalmic Related) ×2 IMPLANT
SYR TB 1ML LL NO SAFETY (SYRINGE) ×2 IMPLANT
TAPE SURG TRANSPORE 1 IN (GAUZE/BANDAGES/DRESSINGS) ×1 IMPLANT
TAPE SURGICAL TRANSPORE 1 IN (GAUZE/BANDAGES/DRESSINGS) ×1
VISCOELASTIC ADDITIONAL (OPHTHALMIC RELATED) ×2 IMPLANT
WATER STERILE IRR 250ML POUR (IV SOLUTION) ×2 IMPLANT

## 2016-04-25 NOTE — Discharge Instructions (Signed)

## 2016-04-25 NOTE — Anesthesia Preprocedure Evaluation (Signed)
Anesthesia Evaluation  Patient identified by MRN, date of birth, ID band Patient awake    Reviewed: Allergy & Precautions, NPO status , Patient's Chart, lab work & pertinent test results  Airway Mallampati: II  TM Distance: >3 FB Neck ROM: Full    Dental  (+) Teeth Intact, Implants   Pulmonary Current Smoker,    breath sounds clear to auscultation       Cardiovascular + angina (remote hx, inactive now.)  Rhythm:Regular Rate:Normal     Neuro/Psych    GI/Hepatic negative GI ROS,   Endo/Other    Renal/GU      Musculoskeletal   Abdominal   Peds  Hematology   Anesthesia Other Findings   Reproductive/Obstetrics                             Anesthesia Physical Anesthesia Plan  ASA: III  Anesthesia Plan: MAC   Post-op Pain Management:    Induction: Intravenous  Airway Management Planned: Nasal Cannula  Additional Equipment:   Intra-op Plan:   Post-operative Plan:   Informed Consent: I have reviewed the patients History and Physical, chart, labs and discussed the procedure including the risks, benefits and alternatives for the proposed anesthesia with the patient or authorized representative who has indicated his/her understanding and acceptance.     Plan Discussed with:   Anesthesia Plan Comments:         Anesthesia Quick Evaluation

## 2016-04-25 NOTE — Anesthesia Procedure Notes (Signed)
Procedure Name: MAC Date/Time: 04/25/2016 7:33 AM Performed by: Vista Deck Pre-anesthesia Checklist: Patient identified, Emergency Drugs available, Suction available, Timeout performed and Patient being monitored Patient Re-evaluated:Patient Re-evaluated prior to inductionOxygen Delivery Method: Nasal Cannula

## 2016-04-25 NOTE — H&P (Signed)
The H and P was reviewed and updated.  No changes were found after exam.  The surgical eye was marked.  

## 2016-04-25 NOTE — Transfer of Care (Signed)
Immediate Anesthesia Transfer of Care Note  Patient: Daniel Crane  Procedure(s) Performed: Procedure(s) (LRB): CATARACT EXTRACTION PHACO AND INTRAOCULAR LENS PLACEMENT (IOC) (Left)  Patient Location: Shortstay  Anesthesia Type: MAC  Level of Consciousness: awake  Airway & Oxygen Therapy: Patient Spontanous Breathing   Post-op Assessment: Report given to PACU RN, Post -op Vital signs reviewed and stable and Patient moving all extremities  Post vital signs: Reviewed and stable  Complications: No apparent anesthesia complications

## 2016-04-25 NOTE — Op Note (Signed)
Date of procedure:   Pre-operative diagnosis: Visually significant cataract, Left Eye  Post-operative diagnosis: Visually significant cataract, Left Eye  Procedure: Removal of cataract via phacoemulsification and insertion of intra-ocular lens Hoya 250 +21.0D into the capsular bag of the Left Eye  Attending surgeon: Gerda Diss. Erroll Wilbourne, MD, MA  Anesthesia: MAC, Topical Akten  Complications: None  Estimated Blood Loss: <2mL (minimal)  Specimens: None  Implants: As above  Indications:  Visually significant cataract, Left Eye  Procedure:  The patient was seen and identified in the pre-operative area. The operative eye was identified and dilated.  The operative eye was marked.  Topical anesthesia was administered to the operative eye.     The patient was then to the operative suite and placed in the supine position.  A timeout was performed confirming the patient, procedure to be performed, and all other relevant information.   The patient's face was prepped and draped in the usual fashion for intra-ocular surgery.  A lid speculum was placed into the operative eye and the surgical microscope moved into place and focused.  A superotemporal paracentesis was created using a 15 degree blad.  Shugarcaine was injected into the anterior chamber.  Viscoelastic was injected into the anterior chamber.  A temporal clear-corneal main wound incision was created using a 2.37mm microkeratome.  A continuous curvilinear capsulorrhexis was initiated using an irrigating cystitome and completed using capsulorrhexis forceps.  Hydrodissection and hydrodeliniation were performed.  Viscoelastic was injected into the anterior chamber.  A phacoemulsification handpiece and a chopper as a second instrument were used to remove the nucleus and epinucleus. The irrigation/aspiration handpiece was used to remove any remaining cortical material.   The capsular bag was reinflated with viscoelastic, checked, and found to be intact.  The new intraocular lens was inserted into the capsular bag and dialed into place using a Sinskey hook.  The irrigation/aspiration handpiece was used to remove any remaining viscoelastic. Miochol was injected into the eye.  The clear corneal wound and paracentesis wounds were then hydrated and checked with Weck-Cels to be watertight.  The lid-speculum and drape was removed, and the patient's face was cleaned with a wet and dry 4x4.  Vigamox was instilled in the eye before a clear shield was taped over the eye. The patient was taken to the post-operative care unit in good condition, having tolerated the procedure well.  Post-Op Instructions: The patient will follow up at [facility] for a same day post-operative evaluation and will receive all other orders and instructions.

## 2016-04-26 ENCOUNTER — Encounter (HOSPITAL_COMMUNITY): Payer: Self-pay | Admitting: Ophthalmology

## 2016-04-30 NOTE — Anesthesia Postprocedure Evaluation (Signed)
Anesthesia Post Note  Patient: Daniel Crane  Procedure(s) Performed: Procedure(s) (LRB): CATARACT EXTRACTION PHACO AND INTRAOCULAR LENS PLACEMENT (IOC) (Left)  Patient location during evaluation: Short Stay Anesthesia Type: MAC Level of consciousness: awake and alert Pain management: satisfactory to patient Vital Signs Assessment: post-procedure vital signs reviewed and stable Respiratory status: spontaneous breathing Cardiovascular status: stable Anesthetic complications: no Comments: Late entry for documentation of post op note perform 04/25/2016 at 0802. Sherrill Raring CRNA 05/01/2016 2111    Last Vitals:  Filed Vitals:   04/25/16 0730 04/25/16 0803  BP: 120/78 134/87  Pulse:  60  Temp:  36.4 C  Resp: 21 16    Last Pain:  Filed Vitals:   04/26/16 1008  PainSc: 0-No pain                 [name]

## 2016-05-15 ENCOUNTER — Encounter (HOSPITAL_COMMUNITY): Payer: Self-pay

## 2016-05-16 ENCOUNTER — Encounter (HOSPITAL_COMMUNITY)
Admission: RE | Admit: 2016-05-16 | Discharge: 2016-05-16 | Disposition: A | Discharge status: Home / Self Care | Payer: 59 | Source: Ambulatory Visit | Attending: Ophthalmology | Admitting: Ophthalmology

## 2016-05-23 ENCOUNTER — Ambulatory Visit (HOSPITAL_COMMUNITY)
Admission: RE | Admit: 2016-05-23 | Discharge: 2016-05-23 | Disposition: A | Discharge status: Home / Self Care | Payer: 59 | Source: Ambulatory Visit | Attending: Ophthalmology | Admitting: Ophthalmology

## 2016-05-23 ENCOUNTER — Encounter (HOSPITAL_COMMUNITY)
Admission: RE | Disposition: A | Discharge status: Home / Self Care | Payer: Self-pay | Source: Ambulatory Visit | Attending: Ophthalmology

## 2016-05-23 ENCOUNTER — Ambulatory Visit (HOSPITAL_COMMUNITY): Payer: 59 | Admitting: Anesthesiology

## 2016-05-23 ENCOUNTER — Encounter (HOSPITAL_COMMUNITY): Payer: Self-pay | Admitting: Anesthesiology

## 2016-05-23 DIAGNOSIS — E7800 Pure hypercholesterolemia, unspecified: Secondary | ICD-10-CM | POA: Insufficient documentation

## 2016-05-23 DIAGNOSIS — H269 Unspecified cataract: Secondary | ICD-10-CM | POA: Diagnosis present

## 2016-05-23 DIAGNOSIS — M1991 Primary osteoarthritis, unspecified site: Secondary | ICD-10-CM | POA: Diagnosis not present

## 2016-05-23 DIAGNOSIS — Z7982 Long term (current) use of aspirin: Secondary | ICD-10-CM | POA: Insufficient documentation

## 2016-05-23 DIAGNOSIS — Z79899 Other long term (current) drug therapy: Secondary | ICD-10-CM | POA: Diagnosis not present

## 2016-05-23 DIAGNOSIS — F17200 Nicotine dependence, unspecified, uncomplicated: Secondary | ICD-10-CM | POA: Insufficient documentation

## 2016-05-23 HISTORY — PX: CATARACT EXTRACTION W/PHACO: SHX586

## 2016-05-23 SURGERY — PHACOEMULSIFICATION, CATARACT, WITH IOL INSERTION
Anesthesia: Monitor Anesthesia Care | Laterality: Right

## 2016-05-23 MED ORDER — LIDOCAINE HCL 3.5 % OP GEL
1.0000 "application " | Freq: Once | OPHTHALMIC | Status: AC
  Administered 2016-05-23: 1 via OPHTHALMIC

## 2016-05-23 MED ORDER — EPINEPHRINE HCL 1 MG/ML IJ SOLN
INTRAMUSCULAR | Status: AC
  Filled 2016-05-23: qty 1

## 2016-05-23 MED ORDER — BSS IO SOLN
INTRAOCULAR | Status: DC | PRN
  Administered 2016-05-23: 15 mL

## 2016-05-23 MED ORDER — MIDAZOLAM HCL 2 MG/2ML IJ SOLN
1.0000 mg | INTRAMUSCULAR | Status: DC | PRN
  Administered 2016-05-23 (×2): 2 mg via INTRAVENOUS
  Filled 2016-05-23: qty 2

## 2016-05-23 MED ORDER — MIDAZOLAM HCL 2 MG/2ML IJ SOLN
INTRAMUSCULAR | Status: AC
  Filled 2016-05-23: qty 2

## 2016-05-23 MED ORDER — CYCLOPENTOLATE-PHENYLEPHRINE 0.2-1 % OP SOLN
1.0000 [drp] | OPHTHALMIC | Status: AC
  Administered 2016-05-23 (×3): 1 [drp] via OPHTHALMIC

## 2016-05-23 MED ORDER — LIDOCAINE HCL (PF) 1 % IJ SOLN
INTRAMUSCULAR | Status: DC | PRN
  Administered 2016-05-23: .8 mL

## 2016-05-23 MED ORDER — ONDANSETRON HCL 4 MG/2ML IJ SOLN: 4.0000 mg | Freq: Once | INTRAMUSCULAR | Status: DC | PRN

## 2016-05-23 MED ORDER — SODIUM HYALURONATE 23 MG/ML IO SOLN
INTRAOCULAR | Status: DC | PRN
  Administered 2016-05-23: 0.6 mL via INTRAOCULAR

## 2016-05-23 MED ORDER — NEOMYCIN-POLYMYXIN-DEXAMETH 3.5-10000-0.1 OP SUSP
OPHTHALMIC | Status: DC | PRN
  Administered 2016-05-23: 2 [drp] via OPHTHALMIC

## 2016-05-23 MED ORDER — TETRACAINE HCL 0.5 % OP SOLN
1.0000 [drp] | OPHTHALMIC | Status: AC
  Administered 2016-05-23 (×3): 1 [drp] via OPHTHALMIC

## 2016-05-23 MED ORDER — FENTANYL CITRATE (PF) 100 MCG/2ML IJ SOLN: 25.0000 ug | INTRAMUSCULAR | Status: DC | PRN

## 2016-05-23 MED ORDER — POVIDONE-IODINE 5 % OP SOLN
OPHTHALMIC | Status: DC | PRN
  Administered 2016-05-23: 1 via OPHTHALMIC

## 2016-05-23 MED ORDER — EPINEPHRINE HCL 1 MG/ML IJ SOLN
INTRAOCULAR | Status: DC | PRN
  Administered 2016-05-23: 500 mL

## 2016-05-23 MED ORDER — FENTANYL CITRATE (PF) 100 MCG/2ML IJ SOLN
25.0000 ug | INTRAMUSCULAR | Status: AC
  Administered 2016-05-23 (×2): 25 ug via INTRAVENOUS

## 2016-05-23 MED ORDER — FENTANYL CITRATE (PF) 100 MCG/2ML IJ SOLN
INTRAMUSCULAR | Status: AC
  Filled 2016-05-23: qty 2

## 2016-05-23 MED ORDER — PROVISC 10 MG/ML IO SOLN
INTRAOCULAR | Status: DC | PRN
  Administered 2016-05-23: 0.85 mL via INTRAOCULAR

## 2016-05-23 MED ORDER — LACTATED RINGERS IV SOLN
INTRAVENOUS | Status: DC
  Administered 2016-05-23: 75 mL/h via INTRAVENOUS

## 2016-05-23 MED ORDER — PHENYLEPHRINE HCL 2.5 % OP SOLN
1.0000 [drp] | OPHTHALMIC | Status: AC
  Administered 2016-05-23 (×3): 1 [drp] via OPHTHALMIC

## 2016-05-23 SURGICAL SUPPLY — 13 items
CLOTH BEACON ORANGE TIMEOUT ST (SAFETY) ×2 IMPLANT
EYE SHIELD UNIVERSAL CLEAR (GAUZE/BANDAGES/DRESSINGS) ×2 IMPLANT
GLOVE BIOGEL PI IND STRL 6.5 (GLOVE) ×1 IMPLANT
GLOVE BIOGEL PI INDICATOR 6.5 (GLOVE) ×1
GLOVE SKINSENSE NS SZ6.5 (GLOVE) ×1
GLOVE SKINSENSE STRL SZ6.5 (GLOVE) ×1 IMPLANT
PAD ARMBOARD 7.5X6 YLW CONV (MISCELLANEOUS) ×2 IMPLANT
SIGHTPATH CAT PROC W REG LENS (Ophthalmic Related) ×2 IMPLANT
SYR TB 1ML LL NO SAFETY (SYRINGE) ×2 IMPLANT
TAPE SURG TRANSPORE 1 IN (GAUZE/BANDAGES/DRESSINGS) ×1 IMPLANT
TAPE SURGICAL TRANSPORE 1 IN (GAUZE/BANDAGES/DRESSINGS) ×1
VISCOELASTIC ADDITIONAL (OPHTHALMIC RELATED) ×2 IMPLANT
WATER STERILE IRR 250ML POUR (IV SOLUTION) ×2 IMPLANT

## 2016-05-23 NOTE — H&P (Signed)
The H and P was reviewed and updated.  No changes were found after exam.  The surgical eye was marked.  

## 2016-05-23 NOTE — Anesthesia Preprocedure Evaluation (Signed)
Anesthesia Evaluation  Patient identified by MRN, date of birth, ID band Patient awake    Reviewed: Allergy & Precautions, NPO status , Patient's Chart, lab work & pertinent test results  Airway Mallampati: II  TM Distance: >3 FB Neck ROM: Full    Dental  (+) Teeth Intact, Implants   Pulmonary Current Smoker,    breath sounds clear to auscultation       Cardiovascular + angina (remote hx, inactive now.)  Rhythm:Regular Rate:Normal     Neuro/Psych    GI/Hepatic negative GI ROS,   Endo/Other    Renal/GU      Musculoskeletal   Abdominal   Peds  Hematology   Anesthesia Other Findings   Reproductive/Obstetrics                             Anesthesia Physical Anesthesia Plan  ASA: III  Anesthesia Plan: MAC   Post-op Pain Management:    Induction: Intravenous  Airway Management Planned: Nasal Cannula  Additional Equipment:   Intra-op Plan:   Post-operative Plan:   Informed Consent: I have reviewed the patients History and Physical, chart, labs and discussed the procedure including the risks, benefits and alternatives for the proposed anesthesia with the patient or authorized representative who has indicated his/her understanding and acceptance.     Plan Discussed with:   Anesthesia Plan Comments:         Anesthesia Quick Evaluation

## 2016-05-23 NOTE — Op Note (Signed)
Date of procedure: 05/23/2016  Pre-operative diagnosis: Visually significant cataract, Right Eye  Post-operative diagnosis: Visually significant cataract, Right Eye  Procedure: Removal of cataract via phacoemulsification and insertion of intra-ocular lens Hoya 250 +22.0D into the capsular bag of the Right Eye  Attending surgeon: Gerda Diss. Miaisabella Bacorn, MD, MA  Anesthesia: MAC, Topical Akten  Complications: None  Estimated Blood Loss: <31mL (minimal)  Specimens: None  Implants: As above  Indications:  Visually significant cataract, Right Eye  Procedure:  The patient was seen and identified in the pre-operative area. The operative eye was identified and dilated.  The operative eye was marked.  Topical anesthesia was administered to the operative eye.     The patient was then to the operative suite and placed in the supine position.  A timeout was performed confirming the patient, procedure to be performed, and all other relevant information.   The patient's face was prepped and draped in the usual fashion for intra-ocular surgery.  A lid speculum was placed into the operative eye and the surgical microscope moved into place and focused.  A superotemporal paracentesis was created using a 15 degree blade.  Lidocaine was injected into the anterior chamber.  Viscoelastic was injected into the anterior chamber.  A temporal clear-corneal main wound incision was created using a 2.29mm microkeratome.  A continuous curvilinear capsulorrhexis was initiated using an irrigating cystitome and completed using capsulorrhexis forceps.  Hydrodissection and hydrodeliniation were performed.  Viscoelastic was injected into the anterior chamber.  A phacoemulsification handpiece and a chopper as a second instrument were used to remove the nucleus and epinucleus. The irrigation/aspiration handpiece was used to remove any remaining cortical material.   The capsular bag was reinflated with viscoelastic, checked, and found  to be intact. A Hoya model 250 lens was inserted into the capsular bag and dialed into place using a Sinskey hook.  The irrigation/aspiration handpiece was used to remove any remaining viscoelastic.  The clear corneal wound and paracentesis wounds were then hydrated and checked with Weck-Cels to be watertight.  The lid-speculum and drape was removed, and the patient's face was cleaned with a wet and dry 4x4.  Vigamox was instilled in the eye before a clear shield was taped over the eye. The patient was taken to the post-operative care unit in good condition, having tolerated the procedure well.  Post-Op Instructions: The patient will follow up at [facility] for a same day post-operative evaluation and will receive all other orders and instructions.

## 2016-05-23 NOTE — Transfer of Care (Signed)
Immediate Anesthesia Transfer of Care Note  Patient: Daniel Crane  Procedure(s) Performed: Procedure(s): CATARACT EXTRACTION PHACO AND INTRAOCULAR LENS PLACEMENT RIGHT EYE CDE=1.11 (Right)  Patient Location: Short Stay  Anesthesia Type:MAC  Level of Consciousness: awake, alert  and oriented  Airway & Oxygen Therapy: Patient Spontanous Breathing  Post-op Assessment: Report given to RN  Post vital signs: Reviewed  Last Vitals:  Filed Vitals:   05/23/16 0805 05/23/16 0810  BP: 140/91 143/90  Pulse:    Temp:    Resp: 13 14    Last Pain:  Filed Vitals:   05/23/16 0814  PainSc: 3          Complications: No apparent anesthesia complications

## 2016-05-23 NOTE — Discharge Instructions (Addendum)
Please discharge patient when stable, will follow up today with Dr. Marisa Hua at the [facility] office.  Leave shield in place until visit.                                                                        Anesthesia, Adult, Care After Refer to this sheet in the next few weeks. These instructions provide you with information on caring for yourself after your procedure. Your health care provider may also give you more specific instructions. Your treatment has been planned according to current medical practices, but problems sometimes occur. Call your health care provider if you have any problems or questions after your procedure. WHAT TO EXPECT AFTER THE PROCEDURE After the procedure, it is typical to experience:  Sleepiness.  Nausea and vomiting. HOME CARE INSTRUCTIONS  For the first 24 hours after general anesthesia:  Have a responsible person with you.  Do not drive a car. If you are alone, do not take public transportation.  Do not drink alcohol.  Do not take medicine that has not been prescribed by your health care provider.  Do not sign important papers or make important decisions.  You may resume a normal diet and activities as directed by your health care provider.  Change bandages (dressings) as directed.  If you have questions or problems that seem related to general anesthesia, call the hospital and ask for the anesthetist or anesthesiologist on call. SEEK MEDICAL CARE IF:  You have nausea and vomiting that continue the day after anesthesia.  You develop a rash. SEEK IMMEDIATE MEDICAL CARE IF:   You have difficulty breathing.  You have chest pain.  You have any allergic problems.   This information is not intended to replace advice given to you by your health care provider. Make sure you discuss any questions you have with your health care provider.   Document Released: 03/24/2001 Document Revised: 01/06/2015 Document Reviewed: 04/15/2012 Elsevier  Interactive Patient Education [employer].

## 2016-05-23 NOTE — Anesthesia Postprocedure Evaluation (Signed)
Anesthesia Post Note  Patient: Daniel Crane  Procedure(s) Performed: Procedure(s) (LRB): CATARACT EXTRACTION PHACO AND INTRAOCULAR LENS PLACEMENT RIGHT EYE CDE=1.11 (Right)  Patient location during evaluation: Short Stay Anesthesia Type: MAC Level of consciousness: awake and alert Pain management: pain level controlled Vital Signs Assessment: post-procedure vital signs reviewed and stable Respiratory status: spontaneous breathing Cardiovascular status: blood pressure returned to baseline Postop Assessment: no signs of nausea or vomiting Anesthetic complications: no    Last Vitals:  Filed Vitals:   05/23/16 0805 05/23/16 0810  BP: 140/91 143/90  Pulse:    Temp:    Resp: 13 14    Last Pain:  Filed Vitals:   05/23/16 0814  PainSc: 3                  Gal Feldhaus

## 2016-05-23 NOTE — Progress Notes (Signed)
   05/23/16 0722  OBSTRUCTIVE SLEEP APNEA  Have you ever been diagnosed with sleep apnea through a sleep study? No  Do you snore loudly (loud enough to be heard through closed doors)?  1  Do you often feel tired, fatigued, or sleepy during the daytime (such as falling asleep during driving or talking to someone)? 0  Has anyone observed you stop breathing during your sleep? 0  Do you have, or are you being treated for high blood pressure? 0  BMI more than 35 kg/m2? 1  Age > 50 (1-yes) 1  Neck circumference greater than:Male 16 inches or larger, Male 17inches or larger? 1  Male Gender (Yes=1) 1  Obstructive Sleep Apnea Score 5  Score 5 or greater  Results sent to PCP

## 2016-05-24 ENCOUNTER — Encounter (HOSPITAL_COMMUNITY): Payer: Self-pay | Admitting: Ophthalmology

## 2016-11-29 ENCOUNTER — Ambulatory Visit (INDEPENDENT_AMBULATORY_CARE_PROVIDER_SITE_OTHER): Payer: 59 | Admitting: Family Medicine

## 2016-11-29 ENCOUNTER — Encounter: Payer: Self-pay | Admitting: Family Medicine

## 2016-11-29 VITALS — BP 130/89 | HR 72 | Temp 97.6°F | Ht 76.0 in | Wt 306.0 lb

## 2016-11-29 DIAGNOSIS — J069 Acute upper respiratory infection, unspecified: Secondary | ICD-10-CM | POA: Diagnosis not present

## 2016-11-29 DIAGNOSIS — G4700 Insomnia, unspecified: Secondary | ICD-10-CM | POA: Diagnosis not present

## 2016-11-29 DIAGNOSIS — B9789 Other viral agents as the cause of diseases classified elsewhere: Secondary | ICD-10-CM | POA: Diagnosis not present

## 2016-11-29 MED ORDER — TRAZODONE HCL 50 MG PO TABS: 100.0000 mg | ORAL_TABLET | Freq: Every day | ORAL | 2 refills | Status: DC

## 2016-11-29 MED ORDER — FLUTICASONE PROPIONATE 50 MCG/ACT NA SUSP: 1.0000 | Freq: Two times a day (BID) | NASAL | 6 refills | Status: DC | PRN

## 2016-11-29 NOTE — Progress Notes (Signed)
BP 130/89   Pulse 72   Temp 97.6 F (36.4 C) (Oral)   Ht 6\' 4"  (1.93 m)   Wt (!) 306 lb (138.8 kg)   BMI 37.25 kg/m    Subjective:    Patient ID: Daniel Crane, male    DOB: 11-Aug-1961, 55 y.o.   MRN: [id number]  HPI: Daniel Crane is a 55 y.o. male presenting on 11/29/2016 for Establish Care; Sinusitis (sinus congestion); and Cough (chest congestion)   HPI Chest congestion and cough Patient is coming in today for chest congestion and cough and sinus congestion that's been going on for the past 5 days. He says is been using DayQuil and NyQuil which have been helping and is feeling better today. He was also having fevers and achiness and being tired throughout the day but that is improved over the past couple days as well. He denies any shortness of breath and wheezing. He says around Thanksgiving he was around his grandkids and that probably where he got this from.  Insomnia Patient has been taking trazodone for quite a few years for his insomnia. He takes 50-150 mg at bedtime help him with sleep every night. He says it works great for him and he denies any issues with the medication. He denies any grogginess waking up the next morning.  Relevant past medical, surgical, family and social history reviewed and updated as indicated. Interim medical history since our last visit reviewed. Allergies and medications reviewed and updated.  Review of Systems  Constitutional: Negative for chills and fever.  HENT: Positive for congestion, postnasal drip, rhinorrhea, sinus pressure, sneezing and sore throat. Negative for ear discharge, ear pain and voice change.   Eyes: Negative for pain, discharge, redness and visual disturbance.  Respiratory: Positive for cough. Negative for shortness of breath and wheezing.   Cardiovascular: Negative for chest pain and leg swelling.  Musculoskeletal: Negative for back pain and gait problem.  Skin: Negative for rash.  Psychiatric/Behavioral: Positive for  sleep disturbance.  All other systems reviewed and are negative.   Per HPI unless specifically indicated above  Social History   Social History  . Marital status: Married    Spouse name: N/A  . Number of children: N/A  . Years of education: N/A   Occupational History  . Not on file.   Social History Main Topics  . Smoking status: Light Tobacco Smoker    Types: Pipe  . Smokeless tobacco: Never Used  . Alcohol use 1.2 oz/week    2 Cans of beer per week     Comment: 2-3 times per week   . Drug use: No  . Sexual activity: Yes    Birth control/ protection: None   Other Topics Concern  . Not on file   Social History Narrative  . No narrative on file    Past Surgical History:  Procedure Laterality Date  . APPENDECTOMY    . CARDIAC CATHETERIZATION    . CATARACT EXTRACTION W/PHACO Left 04/25/2016   Procedure: CATARACT EXTRACTION PHACO AND INTRAOCULAR LENS PLACEMENT (IOC);  Surgeon: Baruch Goldmann, MD;  Location: [facility];  Service: Ophthalmology;  Laterality: Left;  CDE 3.23  . CATARACT EXTRACTION W/PHACO Right 05/23/2016   Procedure: CATARACT EXTRACTION PHACO AND INTRAOCULAR LENS PLACEMENT RIGHT EYE CDE=1.11;  Surgeon: Baruch Goldmann, MD;  Location: [facility];  Service: Ophthalmology;  Laterality: Right;  . COLONOSCOPY N/A 10/09/2015   Procedure: COLONOSCOPY;  Surgeon: Daneil Dolin, MD;  Location: [facility] SUITE;  Service:  Endoscopy;  Laterality: N/A;  1230 - pt can't come earlier - transportation issues  . dental implants      Family History  Problem Relation Age of Onset  . Colon cancer Neg Hx       Medication List       Accurate as of 11/29/16  9:34 AM. Always use your most recent med list.          aspirin EC 81 MG tablet Take 81 mg by mouth daily.   fluticasone 50 MCG/ACT nasal spray Commonly known as:  FLONASE Place 1 spray into both nostrils 2 (two) times daily as needed for allergies or rhinitis.   ibuprofen 200 MG tablet Commonly known as:   ADVIL,MOTRIN Take 400 mg by mouth every 6 (six) hours as needed for moderate pain.   multivitamin with minerals tablet Take 1 tablet by mouth daily.   omega-3 acid ethyl esters 1 g capsule Commonly known as:  LOVAZA Take 1 g by mouth 2 (two) times daily.   polyethylene glycol-electrolytes 420 g solution Commonly known as:  TRILYTE Take 4,000 mLs by mouth as directed.   traZODone 50 MG tablet Commonly known as:  DESYREL Take 2 tablets (100 mg total) by mouth at bedtime.          Objective:    BP 130/89   Pulse 72   Temp 97.6 F (36.4 C) (Oral)   Ht 6\' 4"  (1.93 m)   Wt (!) 306 lb (138.8 kg)   BMI 37.25 kg/m   Wt Readings from Last 3 Encounters:  11/29/16 (!) 306 lb (138.8 kg)  05/23/16 (!) 303 lb (137.4 kg)  04/25/16 (!) 303 lb (137.4 kg)    Physical Exam  Constitutional: He is oriented to person, place, and time. He appears well-developed and well-nourished. No distress.  HENT:  Right Ear: Tympanic membrane, external ear and ear canal normal.  Left Ear: Tympanic membrane, external ear and ear canal normal.  Nose: Mucosal edema and rhinorrhea present. No sinus tenderness. No epistaxis. Right sinus exhibits maxillary sinus tenderness. Right sinus exhibits no frontal sinus tenderness. Left sinus exhibits maxillary sinus tenderness. Left sinus exhibits no frontal sinus tenderness.  Mouth/Throat: Uvula is midline and mucous membranes are normal. Posterior oropharyngeal edema and posterior oropharyngeal erythema present. No oropharyngeal exudate or tonsillar abscesses.  Eyes: Conjunctivae are normal. Right eye exhibits no discharge. Left eye exhibits no discharge. No scleral icterus.  Neck: Neck supple. No thyromegaly present.  Cardiovascular: Normal rate, regular rhythm, normal heart sounds and intact distal pulses.   No murmur heard. Pulmonary/Chest: Effort normal and breath sounds normal. No respiratory distress. He has no wheezes. He has no rales.  Musculoskeletal:  Normal range of motion. He exhibits no edema.  Lymphadenopathy:    He has no cervical adenopathy.  Neurological: He is alert and oriented to person, place, and time. Coordination normal.  Skin: Skin is warm and dry. No rash noted. He is not diaphoretic.  Psychiatric: He has a normal mood and affect. His behavior is normal. Thought content normal.  Nursing note and vitals reviewed.     Assessment & Plan:   Problem List Items Addressed This Visit      Other   Insomnia   Relevant Medications   traZODone (DESYREL) 50 MG tablet    Other Visit Diagnoses    Viral upper respiratory infection    -  Primary   use flonase and mucinex, sounds like improving   Relevant Medications   fluticasone (  FLONASE) 50 MCG/ACT nasal spray       Follow up plan: Return if symptoms worsen or fail to improve.  Caryl Pina, MD [facility] 11/29/2016, 9:34 AM

## 2016-12-27 ENCOUNTER — Ambulatory Visit (INDEPENDENT_AMBULATORY_CARE_PROVIDER_SITE_OTHER): Payer: 59 | Admitting: Family Medicine

## 2016-12-27 ENCOUNTER — Encounter: Payer: Self-pay | Admitting: Family Medicine

## 2016-12-27 VITALS — BP 136/81 | HR 75 | Temp 97.5°F | Ht 76.0 in | Wt 309.4 lb

## 2016-12-27 DIAGNOSIS — Z9189 Other specified personal risk factors, not elsewhere classified: Secondary | ICD-10-CM

## 2016-12-27 DIAGNOSIS — Z131 Encounter for screening for diabetes mellitus: Secondary | ICD-10-CM

## 2016-12-27 DIAGNOSIS — Z13220 Encounter for screening for lipoid disorders: Secondary | ICD-10-CM

## 2016-12-27 DIAGNOSIS — Z125 Encounter for screening for malignant neoplasm of prostate: Secondary | ICD-10-CM

## 2016-12-27 DIAGNOSIS — Z0000 Encounter for general adult medical examination without abnormal findings: Secondary | ICD-10-CM

## 2016-12-27 MED ORDER — ROSUVASTATIN CALCIUM 20 MG PO TABS: 20.0000 mg | ORAL_TABLET | Freq: Every day | ORAL | 3 refills | Status: DC

## 2016-12-27 NOTE — Progress Notes (Signed)
 BP 136/81   Pulse 75   Temp 97.5 F (36.4 C) (Oral)   Ht 6' 4" (1.93 m)   Wt (!) 309 lb 6 oz (140.3 kg)   BMI 37.66 kg/m    Subjective:    Patient ID: Daniel Crane, male    DOB: 07/24/1961, 55 y.o.   MRN: [id number]  HPI: Daniel Crane is a 55 y.o. male presenting on 12/27/2016 for Annual Exam (patient has not eaten since 6 am)   HPI Adult well exam and fasting labs  Patient has a strong history of family cardiac disease at a younger age and we discussed going on a statin for preventative measures. He denies any chest pain or major cardiac issues. He denies any major health issues either. He denies any chest pain, shortness of breath, headaches or vision issues, abdominal complaints, diarrhea, nausea, vomiting, or joint issues.   Relevant past medical, surgical, family and social history reviewed and updated as indicated. Interim medical history since our last visit reviewed. Allergies and medications reviewed and updated.  Review of Systems  Constitutional: Negative for chills and fever.  HENT: Negative for ear pain and tinnitus.   Eyes: Negative for pain.  Respiratory: Negative for cough, shortness of breath and wheezing.   Cardiovascular: Negative for chest pain, palpitations and leg swelling.  Gastrointestinal: Negative for abdominal pain, blood in stool, constipation and diarrhea.  Genitourinary: Negative for dysuria and hematuria.  Musculoskeletal: Negative for back pain and myalgias.  Skin: Negative for rash.  Neurological: Negative for dizziness, weakness and headaches.  Psychiatric/Behavioral: Negative for suicidal ideas.    Per HPI unless specifically indicated above   Allergies as of 12/27/2016   No Known Allergies     Medication List       Accurate as of 12/27/16  2:53 PM. Always use your most recent med list.          aspirin EC 81 MG tablet Take 81 mg by mouth daily.   fluticasone 50 MCG/ACT nasal spray Commonly known as:  FLONASE Place 1  spray into both nostrils 2 (two) times daily as needed for allergies or rhinitis.   ibuprofen 200 MG tablet Commonly known as:  ADVIL,MOTRIN Take 400 mg by mouth every 6 (six) hours as needed for moderate pain.   multivitamin with minerals tablet Take 1 tablet by mouth daily.   omega-3 acid ethyl esters 1 g capsule Commonly known as:  LOVAZA Take 1 g by mouth 2 (two) times daily.   polyethylene glycol-electrolytes 420 g solution Commonly known as:  TRILYTE Take 4,000 mLs by mouth as directed.   traZODone 50 MG tablet Commonly known as:  DESYREL Take 2 tablets (100 mg total) by mouth at bedtime.          Objective:    BP 136/81   Pulse 75   Temp 97.5 F (36.4 C) (Oral)   Ht 6' 4" (1.93 m)   Wt (!) 309 lb 6 oz (140.3 kg)   BMI 37.66 kg/m   Wt Readings from Last 3 Encounters:  12/27/16 (!) 309 lb 6 oz (140.3 kg)  11/29/16 (!) 306 lb (138.8 kg)  05/23/16 (!) 303 lb (137.4 kg)    Physical Exam  Constitutional: He is oriented to person, place, and time. He appears well-developed and well-nourished. No distress.  HENT:  Right Ear: External ear normal.  Left Ear: External ear normal.  Nose: Nose normal.  Mouth/Throat: Oropharynx is clear and moist. No oropharyngeal exudate.    Eyes: Conjunctivae and EOM are normal. Pupils are equal, round, and reactive to light. Right eye exhibits no discharge. No scleral icterus.  Neck: Neck supple. No thyromegaly present.  Cardiovascular: Normal rate, regular rhythm, normal heart sounds and intact distal pulses.   No murmur heard. Pulmonary/Chest: Effort normal and breath sounds normal. No respiratory distress. He has no wheezes.  Abdominal: Soft. Bowel sounds are normal. He exhibits no distension. There is no tenderness. There is no rebound and no guarding.  Genitourinary:  Genitourinary Comments: Patient declined today  Musculoskeletal: Normal range of motion. He exhibits no edema.  Lymphadenopathy:    He has no cervical adenopathy.   Neurological: He is alert and oriented to person, place, and time. Coordination normal.  Skin: Skin is warm and dry. No rash noted. He is not diaphoretic.  Psychiatric: He has a normal mood and affect. His behavior is normal.  Vitals reviewed.     Assessment & Plan:   Problem List Items Addressed This Visit    None    Visit Diagnoses    Well adult exam    -  Primary   Relevant Orders   CMP14+EGFR   Lipid panel   PSA, total and free   Prostate cancer screening       Relevant Orders   PSA, total and free   Diabetes mellitus screening       Relevant Orders   CMP14+EGFR   Lipid screening       Relevant Orders   Lipid panel   At risk for cardiovascular event       Family history of CAD and high risk factor for coronary event, will put on Crestor   Relevant Medications   rosuvastatin (CRESTOR) 20 MG tablet       Follow up plan: Return in about 1 year (around 12/27/2017), or if symptoms worsen or fail to improve.  Counseling provided for all of the vaccine components Orders Placed This Encounter  Procedures  . CMP14+EGFR  . Lipid panel  . PSA, total and free    Joshua Dettinger, MD [facility] 12/27/2016, 2:53 PM     

## 2016-12-28 LAB — CMP14+EGFR
A/G RATIO: 1.4 (ref 1.2–2.2)
ALK PHOS: 58 IU/L (ref 39–117)
ALT: 62 IU/L — AB (ref 0–44)
AST: 39 IU/L (ref 0–40)
Albumin: 4.2 g/dL (ref 3.5–5.5)
BUN/Creatinine Ratio: 12 (ref 9–20)
BUN: 12 mg/dL (ref 6–24)
Bilirubin Total: 0.4 mg/dL (ref 0.0–1.2)
CALCIUM: 9.2 mg/dL (ref 8.7–10.2)
CHLORIDE: 100 mmol/L (ref 96–106)
CO2: 25 mmol/L (ref 18–29)
Creatinine, Ser: 0.98 mg/dL (ref 0.76–1.27)
GFR calc Af Amer: 100 mL/min/{1.73_m2} (ref >59)
GFR, EST NON AFRICAN AMERICAN: 86 mL/min/{1.73_m2} (ref >59)
Globulin, Total: 3 g/dL (ref 1.5–4.5)
Glucose: 87 mg/dL (ref 65–99)
POTASSIUM: 4.3 mmol/L (ref 3.5–5.2)
SODIUM: 141 mmol/L (ref 134–144)
Total Protein: 7.2 g/dL (ref 6.0–8.5)

## 2016-12-28 LAB — LIPID PANEL
CHOL/HDL RATIO: 5.2 ratio — AB (ref 0.0–5.0)
CHOLESTEROL TOTAL: 219 mg/dL — AB (ref 100–199)
HDL: 42 mg/dL (ref >39)
LDL Calculated: 144 mg/dL — ABNORMAL HIGH (ref 0–99)
TRIGLYCERIDES: 164 mg/dL — AB (ref 0–149)
VLDL Cholesterol Cal: 33 mg/dL (ref 5–40)

## 2016-12-28 LAB — PSA, TOTAL AND FREE
PSA FREE PCT: 28.2 %
PSA, Free: 0.31 ng/mL
Prostate Specific Ag, Serum: 1.1 ng/mL (ref 0.0–4.0)

## 2017-01-17 ENCOUNTER — Telehealth: Payer: Self-pay | Admitting: Family Medicine

## 2017-01-17 NOTE — Telephone Encounter (Signed)
PT AWARE - SAT APPT MADE

## 2017-01-17 NOTE — Telephone Encounter (Signed)
This could be flu or bacterial or viral, he probably needs to be tested for influenza because it's been going around the community. Tell if he can get off work today we are open tomorrow and he can come in on Saturday morning between 8 and noon

## 2017-01-18 ENCOUNTER — Ambulatory Visit (INDEPENDENT_AMBULATORY_CARE_PROVIDER_SITE_OTHER): Payer: 59 | Admitting: Physician Assistant

## 2017-01-18 VITALS — BP 135/85 | HR 88 | Temp 97.0°F | Ht 76.0 in | Wt 311.0 lb

## 2017-01-18 DIAGNOSIS — J209 Acute bronchitis, unspecified: Secondary | ICD-10-CM | POA: Diagnosis not present

## 2017-01-18 MED ORDER — PREDNISONE 10 MG (21) PO TBPK: ORAL_TABLET | ORAL | 0 refills | Status: DC

## 2017-01-18 MED ORDER — ALBUTEROL SULFATE HFA 108 (90 BASE) MCG/ACT IN AERS: 2.0000 | INHALATION_SPRAY | Freq: Four times a day (QID) | RESPIRATORY_TRACT | 0 refills | Status: DC | PRN

## 2017-01-18 MED ORDER — DOXYCYCLINE HYCLATE 100 MG PO TABS: 100.0000 mg | ORAL_TABLET | Freq: Two times a day (BID) | ORAL | 0 refills | Status: DC

## 2017-01-18 MED ORDER — HYDROCODONE-HOMATROPINE 5-1.5 MG/5ML PO SYRP: 5.0000 mL | ORAL_SOLUTION | Freq: Four times a day (QID) | ORAL | 0 refills | Status: DC | PRN

## 2017-01-18 NOTE — Patient Instructions (Signed)

## 2017-01-18 NOTE — Progress Notes (Signed)
BP 135/85   Pulse 88   Temp 97 F (36.1 C) (Oral)   Ht _0  (1.93 m)   Wt (!) 311 lb (141.1 kg)   BMI 37.86 kg/m    Subjective:    Patient ID: Daniel Crane, male    DOB: 1961/04/06, 56 y.o.   MRN: [id number]  HPI: Daniel Crane is a 56 y.o. male presenting on 01/18/2017 for Cough  Patient with several days of progressing aupprt respiratory and bronchial symptoms. Initially there was more upper respiratory congestion. This progressed to having significant cough that is productive throughout the day and severe at night. There is occasional wheezing after coughing. They will sometimes have slight dyspnea on exertion. It is productive mucus that is yellow in color. Denies any blood.  Relevant past medical, surgical, family and social history reviewed and updated as indicated. Allergies and medications reviewed and updated.  Past Medical History:  Diagnosis Date  . Anginal pain ([place])    history of 11 years ago. No meds at present.  . Arthritis   . Heart disease    catheterization in remote past, medically managed  . Hypercholesteremia     Past Surgical History:  Procedure Laterality Date  . APPENDECTOMY    . CARDIAC CATHETERIZATION    . CATARACT EXTRACTION W/PHACO Left 04/25/2016   Procedure: CATARACT EXTRACTION PHACO AND INTRAOCULAR LENS PLACEMENT (IOC);  Surgeon: Baruch Goldmann, MD;  Location: [facility];  Service: Ophthalmology;  Laterality: Left;  CDE 3.23  . CATARACT EXTRACTION W/PHACO Right 05/23/2016   Procedure: CATARACT EXTRACTION PHACO AND INTRAOCULAR LENS PLACEMENT RIGHT EYE CDE=1.11;  Surgeon: Baruch Goldmann, MD;  Location: [facility];  Service: Ophthalmology;  Laterality: Right;  . COLONOSCOPY N/A 10/09/2015   Procedure: COLONOSCOPY;  Surgeon: Daneil Dolin, MD;  Location: [facility];  Service: Endoscopy;  Laterality: N/A;  1230 - pt can't come earlier - transportation issues  . dental implants      Review of Systems  Constitutional: Positive for fatigue. Negative for  appetite change.  HENT: Positive for sinus pressure and sore throat.   Eyes: Negative.  Negative for pain and visual disturbance.  Respiratory: Positive for shortness of breath and wheezing. Negative for cough and chest tightness.   Cardiovascular: Negative.  Negative for chest pain, palpitations and leg swelling.  Gastrointestinal: Negative.  Negative for abdominal pain, diarrhea, nausea and vomiting.  Endocrine: Negative.   Genitourinary: Negative.   Musculoskeletal: Positive for back pain and myalgias.  Skin: Negative.  Negative for color change and rash.  Neurological: Positive for headaches. Negative for weakness and numbness.  Psychiatric/Behavioral: Negative.     Allergies as of 01/18/2017   No Known Allergies     Medication List       Accurate as of 01/18/17  8:29 AM. Always use your most recent med list.          albuterol 108 (90 Base) MCG/ACT inhaler Commonly known as:  PROVENTIL HFA;VENTOLIN HFA Inhale 2 puffs into the lungs every 6 (six) hours as needed for wheezing or shortness of breath.   aspirin EC 81 MG tablet Take 81 mg by mouth daily.   doxycycline 100 MG tablet Commonly known as:  VIBRA-TABS Take 1 tablet (100 mg total) by mouth 2 (two) times daily.   fluticasone 50 MCG/ACT nasal spray Commonly known as:  FLONASE Place 1 spray into both nostrils 2 (two) times daily as needed for allergies or rhinitis.   HYDROcodone-homatropine 5-1.5 MG/5ML syrup Commonly known  as:  HYCODAN Take 5-10 mLs by mouth every 6 (six) hours as needed.   ibuprofen 200 MG tablet Commonly known as:  ADVIL,MOTRIN Take 400 mg by mouth every 6 (six) hours as needed for moderate pain.   multivitamin with minerals tablet Take 1 tablet by mouth daily.   omega-3 acid ethyl esters 1 g capsule Commonly known as:  LOVAZA Take 1 g by mouth 2 (two) times daily.   predniSONE 10 MG (21) Tbpk tablet Commonly known as:  STERAPRED UNI-PAK 21 TAB As directed x 6 days   rosuvastatin 20  MG tablet Commonly known as:  CRESTOR Take 1 tablet (20 mg total) by mouth daily.   traZODone 50 MG tablet Commonly known as:  DESYREL Take 2 tablets (100 mg total) by mouth at bedtime.          Objective:    BP 135/85   Pulse 88   Temp 97 F (36.1 C) (Oral)   Ht _0  (1.93 m)   Wt (!) 311 lb (141.1 kg)   BMI 37.86 kg/m   No Known Allergies  Physical Exam  Results for orders placed or performed in visit on 12/27/16  CMP14+EGFR  Result Value Ref Range   Glucose 87 65 - 99 mg/dL   BUN 12 6 - 24 mg/dL   Creatinine, Ser 0.98 0.76 - 1.27 mg/dL   GFR calc non Af Amer 86 >59 mL/min/1.73   GFR calc Af Amer 100 >59 mL/min/1.73   BUN/Creatinine Ratio 12 9 - 20   Sodium 141 134 - 144 mmol/L   Potassium 4.3 3.5 - 5.2 mmol/L   Chloride 100 96 - 106 mmol/L   CO2 25 18 - 29 mmol/L   Calcium 9.2 8.7 - 10.2 mg/dL   Total Protein 7.2 6.0 - 8.5 g/dL   Albumin 4.2 3.5 - 5.5 g/dL   Globulin, Total 3.0 1.5 - 4.5 g/dL   Albumin/Globulin Ratio 1.4 1.2 - 2.2   Bilirubin Total 0.4 0.0 - 1.2 mg/dL   Alkaline Phosphatase 58 39 - 117 IU/L   AST 39 0 - 40 IU/L   ALT 62 (H) 0 - 44 IU/L  Lipid panel  Result Value Ref Range   Cholesterol, Total 219 (H) 100 - 199 mg/dL   Triglycerides 164 (H) 0 - 149 mg/dL   HDL 42 >39 mg/dL   VLDL Cholesterol Cal 33 5 - 40 mg/dL   LDL Calculated 144 (H) 0 - 99 mg/dL   Chol/HDL Ratio 5.2 (H) 0.0 - 5.0 ratio units  PSA, total and free  Result Value Ref Range   Prostate Specific Ag, Serum 1.1 0.0 - 4.0 ng/mL   PSA, Free 0.31 N/A ng/mL   PSA, Free Pct 28.2 %      Assessment & Plan:   1. Acute bronchitis, unspecified organism - doxycycline (VIBRA-TABS) 100 MG tablet; Take 1 tablet (100 mg total) by mouth 2 (two) times daily.  Dispense: 20 tablet; Refill: 0 - predniSONE (STERAPRED UNI-PAK 21 TAB) 10 MG (21) TBPK tablet; As directed x 6 days  Dispense: 21 tablet; Refill: 0 - albuterol (PROVENTIL HFA;VENTOLIN HFA) 108 (90 Base) MCG/ACT inhaler; Inhale 2  puffs into the lungs every 6 (six) hours as needed for wheezing or shortness of breath.  Dispense: 1 Inhaler; Refill: 0 - HYDROcodone-homatropine (HYCODAN) 5-1.5 MG/5ML syrup; Take 5-10 mLs by mouth every 6 (six) hours as needed.  Dispense: 240 mL; Refill: 0   Continue all other maintenance medications as listed above.  Follow up plan: Return if symptoms worsen or fail to improve.  No orders of the defined types were placed in this encounter.   Educational handout given for bronchitis  Terald Sleeper PA-C [facility] [address]  [place], [place] [address] [phone]   01/18/2017, 8:29 AM

## 2017-04-01 ENCOUNTER — Ambulatory Visit (INDEPENDENT_AMBULATORY_CARE_PROVIDER_SITE_OTHER): Payer: 59 | Admitting: Family Medicine

## 2017-04-01 ENCOUNTER — Encounter: Payer: Self-pay | Admitting: Family Medicine

## 2017-04-01 VITALS — BP 136/89 | HR 61 | Temp 96.9°F | Ht 76.0 in | Wt 319.0 lb

## 2017-04-01 DIAGNOSIS — M5442 Lumbago with sciatica, left side: Secondary | ICD-10-CM | POA: Diagnosis not present

## 2017-04-01 DIAGNOSIS — G4700 Insomnia, unspecified: Secondary | ICD-10-CM

## 2017-04-01 DIAGNOSIS — E785 Hyperlipidemia, unspecified: Secondary | ICD-10-CM | POA: Insufficient documentation

## 2017-04-01 DIAGNOSIS — M5441 Lumbago with sciatica, right side: Secondary | ICD-10-CM | POA: Diagnosis not present

## 2017-04-01 DIAGNOSIS — E782 Mixed hyperlipidemia: Secondary | ICD-10-CM | POA: Insufficient documentation

## 2017-04-01 LAB — CMP14+EGFR
A/G RATIO: 1.7 (ref 1.2–2.2)
ALK PHOS: 56 IU/L (ref 39–117)
ALT: 53 IU/L — AB (ref 0–44)
AST: 39 IU/L (ref 0–40)
Albumin: 4.5 g/dL (ref 3.5–5.5)
BUN/Creatinine Ratio: 14 (ref 9–20)
BUN: 14 mg/dL (ref 6–24)
Bilirubin Total: 0.5 mg/dL (ref 0.0–1.2)
CHLORIDE: 101 mmol/L (ref 96–106)
CO2: 22 mmol/L (ref 18–29)
Calcium: 9.2 mg/dL (ref 8.7–10.2)
Creatinine, Ser: 0.98 mg/dL (ref 0.76–1.27)
GFR calc Af Amer: 100 mL/min/{1.73_m2} (ref >59)
GFR calc non Af Amer: 86 mL/min/{1.73_m2} (ref >59)
GLUCOSE: 98 mg/dL (ref 65–99)
Globulin, Total: 2.6 g/dL (ref 1.5–4.5)
POTASSIUM: 4.4 mmol/L (ref 3.5–5.2)
Sodium: 141 mmol/L (ref 134–144)
Total Protein: 7.1 g/dL (ref 6.0–8.5)

## 2017-04-01 LAB — LIPID PANEL
Chol/HDL Ratio: 3 ratio (ref 0.0–5.0)
Cholesterol, Total: 133 mg/dL (ref 100–199)
HDL: 45 mg/dL
LDL Calculated: 64 mg/dL (ref 0–99)
Triglycerides: 122 mg/dL (ref 0–149)
VLDL Cholesterol Cal: 24 mg/dL (ref 5–40)

## 2017-04-01 MED ORDER — METHYLPREDNISOLONE ACETATE 80 MG/ML IJ SUSP
80.0000 mg | Freq: Once | INTRAMUSCULAR | Status: AC
  Administered 2017-04-01: 80 mg via INTRAMUSCULAR

## 2017-04-01 MED ORDER — MELOXICAM 15 MG PO TABS: 15.0000 mg | ORAL_TABLET | Freq: Every day | ORAL | 0 refills | Status: DC

## 2017-04-01 NOTE — Progress Notes (Signed)
BP 136/89   Pulse 61   Temp (!) 96.9 F (36.1 C) (Oral)   Ht '6\' 4"'$  (1.93 m)   Wt (!) 319 lb (144.7 kg)   BMI 38.83 kg/m    Subjective:    Patient ID: Daniel Crane, male    DOB: [birth year], 56 y.o.   MRN: [id number]  HPI: Daniel Crane is a 56 y.o. male presenting on 04/01/2017 for Hyperlipidemia (3 month followup; patient is fasting) and Labwork (recheck LFT's)   HPI Hyperlipidemia Patient is coming in for recheck of his hyperlipidemia. He is currently taking Crestor. He does have some issues with myalgias that he is able to control by taking an over-the-counter magnesium supplement but denies history of liver damage from it. He denies any focal numbness or weakness or chest pain.   Low back pain Patient is coming in with low back pain meds been bothering him more for the past couple weeks. He says that when he is walking he has a pain that shoots down the back of his right leg and hamstring. When he is laying down at night he has a pain that shoots down his left leg and hamstring. Currently in office sitting here today he denies any pain or any shooting pain. He just says is exacerbated by those 2 situations. He has been taking over-the-counter ibuprofen but it was not helping as much so he took a couple of his wife's meloxicam and that seems to be helping him a lot more. He denies any fevers or chills or redness or warmth. He denies any numbness or weakness in either of his legs. He denies any loss of bowel or bladder. He says that he has known arthritis and degeneration in his spine from previous imaging in the past.  Insomnia recheck Patient has been doing well and the trazodone which he only uses as needed. He is sleeping a lot better at night with this.  Relevant past medical, surgical, family and social history reviewed and updated as indicated. Interim medical history since our last visit reviewed. Allergies and medications reviewed and updated.  Review of Systems    Constitutional: Negative for chills and fever.  Eyes: Negative for discharge.  Respiratory: Negative for shortness of breath and wheezing.   Cardiovascular: Negative for chest pain and leg swelling.  Musculoskeletal: Positive for back pain and myalgias. Negative for gait problem.  Skin: Negative for rash.  Neurological: Negative for dizziness, light-headedness and headaches.  Psychiatric/Behavioral: Negative for decreased concentration, self-injury, sleep disturbance and suicidal ideas.  All other systems reviewed and are negative.   Per HPI unless specifically indicated above     Objective:    BP 136/89   Pulse 61   Temp (!) 96.9 F (36.1 C) (Oral)   Ht '6\' 4"'$  (1.93 m)   Wt (!) 319 lb (144.7 kg)   BMI 38.83 kg/m   Wt Readings from Last 3 Encounters:  04/01/17 (!) 319 lb (144.7 kg)  01/18/17 (!) 311 lb (141.1 kg)  12/27/16 (!) 309 lb 6 oz (140.3 kg)    Physical Exam  Constitutional: He is oriented to person, place, and time. He appears well-developed and well-nourished. No distress.  Eyes: Conjunctivae are normal. No scleral icterus.  Neck: Neck supple.  Cardiovascular: Normal rate, regular rhythm, normal heart sounds and intact distal pulses.   No murmur heard. Pulmonary/Chest: Effort normal and breath sounds normal. No respiratory distress. He has no wheezes. He has no rales.  Musculoskeletal: Normal range of  motion. He exhibits no edema.       Lumbar back: He exhibits tenderness (Low back tenderness, midline and L4-S3 region. Negative straight leg raise bilaterally). He exhibits normal range of motion and no deformity.  Lymphadenopathy:    He has no cervical adenopathy.  Neurological: He is alert and oriented to person, place, and time. Coordination normal.  Skin: Skin is warm and dry. No rash noted. He is not diaphoretic.  Psychiatric: He has a normal mood and affect. His behavior is normal.  Nursing note and vitals reviewed.     Assessment & Plan:   Problem List  Items Addressed This Visit      Other   Insomnia   Relevant Orders   CMP14+EGFR   Hyperlipidemia LDL goal <130   Relevant Orders   CMP14+EGFR   Lipid panel    Other Visit Diagnoses    Acute bilateral low back pain with bilateral sciatica    -  Primary   Relevant Medications   methylPREDNISolone acetate (DEPO-MEDROL) injection 80 mg   meloxicam (MOBIC) 15 MG tablet       Follow up plan: Return in about 6 months (around 10/01/2017), or if symptoms worsen or fail to improve, for hyperlipidemia recheck.  Counseling provided for all of the vaccine components Orders Placed This Encounter  Procedures  . CMP14+EGFR  . Lipid panel    Caryl Pina, MD [facility] 04/01/2017, 8:29 AM

## 2017-04-26 ENCOUNTER — Other Ambulatory Visit: Payer: Self-pay | Admitting: Family Medicine

## 2017-04-26 DIAGNOSIS — M5442 Lumbago with sciatica, left side: Principal | ICD-10-CM

## 2017-04-26 DIAGNOSIS — M5441 Lumbago with sciatica, right side: Secondary | ICD-10-CM

## 2017-09-04 ENCOUNTER — Other Ambulatory Visit: Payer: Self-pay | Admitting: Family Medicine

## 2017-09-04 DIAGNOSIS — G4700 Insomnia, unspecified: Secondary | ICD-10-CM

## 2017-10-03 ENCOUNTER — Ambulatory Visit (INDEPENDENT_AMBULATORY_CARE_PROVIDER_SITE_OTHER): Payer: 59 | Admitting: Family Medicine

## 2017-10-03 ENCOUNTER — Encounter: Payer: Self-pay | Admitting: Family Medicine

## 2017-10-03 VITALS — BP 133/85 | HR 66 | Temp 98.3°F | Ht 76.0 in | Wt 322.0 lb

## 2017-10-03 DIAGNOSIS — R0609 Other forms of dyspnea: Secondary | ICD-10-CM

## 2017-10-03 DIAGNOSIS — Z23 Encounter for immunization: Secondary | ICD-10-CM

## 2017-10-03 DIAGNOSIS — G4700 Insomnia, unspecified: Secondary | ICD-10-CM

## 2017-10-03 DIAGNOSIS — E785 Hyperlipidemia, unspecified: Secondary | ICD-10-CM | POA: Diagnosis not present

## 2017-10-03 MED ORDER — ROSUVASTATIN CALCIUM 20 MG PO TABS: 20.0000 mg | ORAL_TABLET | Freq: Every day | ORAL | 3 refills | Status: DC

## 2017-10-03 MED ORDER — DICLOFENAC SODIUM 1 % TD GEL: 2.0000 g | Freq: Four times a day (QID) | TRANSDERMAL | 2 refills | Status: DC

## 2017-10-03 MED ORDER — MELOXICAM 15 MG PO TABS: 15.0000 mg | ORAL_TABLET | Freq: Every day | ORAL | 2 refills | Status: DC

## 2017-10-03 MED ORDER — ALBUTEROL SULFATE HFA 108 (90 BASE) MCG/ACT IN AERS: 2.0000 | INHALATION_SPRAY | Freq: Four times a day (QID) | RESPIRATORY_TRACT | 0 refills | Status: DC | PRN

## 2017-10-03 NOTE — Progress Notes (Signed)
BP 133/85   Pulse 66   Temp 98.3 F (36.8 C) (Oral)   Ht 6\' 4"  (1.93 m)   Wt (!) 322 lb (146.1 kg)   BMI 39.20 kg/m    Subjective:    Patient ID: Daniel Crane, male    DOB: 04-30-1961, 56 y.o.   MRN: [id number]  HPI: FRUTOSO DIMARE is a 56 y.o. male presenting on 10/03/2017 for Hyperlipidemia (6 mo follow up)   HPI Hyperlipidemia Patient is coming in for recheck of his hyperlipidemia. The patient is currently taking lorazepam and Crestor. They deny any issues with myalgias or history of liver damage from it. They deny any focal numbness or weakness or chest pain.   Insomnia recheck Patient is coming in for an insomnia recheck. She is currently using trazodone is happy with that and feels like he is sleeping better.  Exertional dyspnea Patient has been having shortness of breath on exertion that has been increased over the past few months. He does have a history of 50% blockage when he was 45 has not had it checked since. He does also have a history of 2-1/2 pack per day 16 years smoking that he quit when he was 42. He currently smokes 1-3 cigars daily. He denies any shortness of breath at rest, he denies any coughing or wheezing spells that he knows of. He has had an increased workload at work and has been more physically active and it may be due to that but he doesn't know for sure  Relevant past medical, surgical, family and social history reviewed and updated as indicated. Interim medical history since our last visit reviewed. Allergies and medications reviewed and updated.  Review of Systems  Constitutional: Negative for chills and fever.  HENT: Negative for congestion.   Eyes: Negative for discharge.  Respiratory: Positive for shortness of breath. Negative for cough, chest tightness and wheezing.   Cardiovascular: Negative for chest pain and leg swelling.  Gastrointestinal: Negative for abdominal pain.  Musculoskeletal: Negative for back pain and gait problem.  Skin:  Negative for rash.  All other systems reviewed and are negative.   Per HPI unless specifically indicated above   Allergies as of 10/03/2017   No Known Allergies     Medication List       Accurate as of 10/03/17  8:12 AM. Always use your most recent med list.          aspirin EC 81 MG tablet Take 81 mg by mouth daily.   fluticasone 50 MCG/ACT nasal spray Commonly known as:  FLONASE Place 1 spray into both nostrils 2 (two) times daily as needed for allergies or rhinitis.   ibuprofen 200 MG tablet Commonly known as:  ADVIL,MOTRIN Take 400 mg by mouth every 6 (six) hours as needed for moderate pain.   meloxicam 15 MG tablet Commonly known as:  MOBIC TAKE 1 TABLET(15 MG) BY MOUTH DAILY   multivitamin with minerals tablet Take 1 tablet by mouth daily.   omega-3 acid ethyl esters 1 g capsule Commonly known as:  LOVAZA Take 1 g by mouth 2 (two) times daily.   rosuvastatin 20 MG tablet Commonly known as:  CRESTOR Take 1 tablet (20 mg total) by mouth daily.   traZODone 50 MG tablet Commonly known as:  DESYREL TAKE 2 TABLETS(100 MG) BY MOUTH AT BEDTIME   Turmeric 400 MG Caps Take 400 mg by mouth daily.          Objective:  BP 133/85   Pulse 66   Temp 98.3 F (36.8 C) (Oral)   Ht 6\' 4"  (1.93 m)   Wt (!) 322 lb (146.1 kg)   BMI 39.20 kg/m   Wt Readings from Last 3 Encounters:  10/03/17 (!) 322 lb (146.1 kg)  04/01/17 (!) 319 lb (144.7 kg)  01/18/17 (!) 311 lb (141.1 kg)    Physical Exam  Constitutional: He is oriented to person, place, and time. He appears well-developed and well-nourished. No distress.  Eyes: Conjunctivae are normal. No scleral icterus.  Neck: Neck supple. No thyromegaly present.  Cardiovascular: Normal rate, regular rhythm, normal heart sounds and intact distal pulses.   No murmur heard. Pulmonary/Chest: Effort normal and breath sounds normal. No respiratory distress. He has no wheezes. He has no rales.  Musculoskeletal: Normal range  of motion. He exhibits no edema.  Lymphadenopathy:    He has no cervical adenopathy.  Neurological: He is alert and oriented to person, place, and time. Coordination normal.  Skin: Skin is warm and dry. No rash noted. He is not diaphoretic.  Psychiatric: He has a normal mood and affect. His behavior is normal.  Nursing note and vitals reviewed.     Assessment & Plan:   Problem List Items Addressed This Visit      Other   Insomnia   Hyperlipidemia LDL goal <130 - Primary   Relevant Medications   rosuvastatin (CRESTOR) 20 MG tablet   Other Relevant Orders   Lipid panel    Other Visit Diagnoses    Exertional dyspnea       Relevant Medications   rosuvastatin (CRESTOR) 20 MG tablet   albuterol (PROVENTIL HFA;VENTOLIN HFA) 108 (90 Base) MCG/ACT inhaler   Other Relevant Orders   Ambulatory referral to Cardiology       Follow up plan: Return in about 6 months (around 04/03/2018), or if symptoms worsen or fail to improve, for Recheck cholesterol.  Counseling provided for all of the vaccine components Orders Placed This Encounter  Procedures  . Lipid panel    Caryl Pina, MD [facility] 10/03/2017, 8:12 AM

## 2017-10-04 LAB — LIPID PANEL
CHOLESTEROL TOTAL: 171 mg/dL (ref 100–199)
Chol/HDL Ratio: 4.1 ratio (ref 0.0–5.0)
HDL: 42 mg/dL (ref >39)
LDL CALC: 91 mg/dL (ref 0–99)
Triglycerides: 188 mg/dL — ABNORMAL HIGH (ref 0–149)
VLDL Cholesterol Cal: 38 mg/dL (ref 5–40)

## 2017-10-22 ENCOUNTER — Ambulatory Visit (INDEPENDENT_AMBULATORY_CARE_PROVIDER_SITE_OTHER): Payer: 59 | Admitting: Cardiology

## 2017-10-22 ENCOUNTER — Encounter: Payer: Self-pay | Admitting: Cardiology

## 2017-10-22 VITALS — BP 140/82 | HR 74 | Ht 76.5 in | Wt 324.4 lb

## 2017-10-22 DIAGNOSIS — R0600 Dyspnea, unspecified: Secondary | ICD-10-CM | POA: Diagnosis not present

## 2017-10-22 DIAGNOSIS — I2510 Atherosclerotic heart disease of native coronary artery without angina pectoris: Secondary | ICD-10-CM | POA: Diagnosis not present

## 2017-10-22 DIAGNOSIS — R0602 Shortness of breath: Secondary | ICD-10-CM | POA: Diagnosis not present

## 2017-10-22 DIAGNOSIS — G4730 Sleep apnea, unspecified: Secondary | ICD-10-CM

## 2017-10-22 NOTE — Progress Notes (Signed)
Clinical Summary Daniel Crane is a 56 y.o.male seen as new consult, referred by Dr Dettinger for history of CAD and recent SOB.   1. CAD - cath 2005 as reported below. 50% LAD/ostial D1. Nuclear stress at that time did not show ischemia, this was managed medically - increased DOE over the last 6 months. Few episodes of chest pain over the last year. Cramping like mid to left chest, 5-6/10 in severity. Occurs primarily at rest. No other assocaited symptoms. Not positional, better with deep breaths. Lasts about 5 minutes. Total 2-3 episodes over the last few months, last episode 3-4 months ago - DOE just walking around the house. DOE with yardwork. About 1 year ago could tolerate without troubles - no coughing or wheezing however does have a tobacco history, he denies having PFTs prevoiusly - Can have some LE edema at times   2. OSA screen + snoring, + apneic episodes, +fatigue    Past Medical History:  Diagnosis Date  . Anginal pain ([place])    history of 11 years ago. No meds at present.  . Arthritis   . Heart disease    catheterization in remote past, medically managed  . Hypercholesteremia      No Known Allergies   Current Outpatient Prescriptions  Medication Sig Dispense Refill  . albuterol (PROVENTIL HFA;VENTOLIN HFA) 108 (90 Base) MCG/ACT inhaler Inhale 2 puffs into the lungs every 6 (six) hours as needed for wheezing or shortness of breath. 1 Inhaler 0  . aspirin EC 81 MG tablet Take 81 mg by mouth daily.    . diclofenac sodium (VOLTAREN) 1 % GEL Apply 2 g topically 4 (four) times daily. 100 g 2  . fluticasone (FLONASE) 50 MCG/ACT nasal spray Place 1 spray into both nostrils 2 (two) times daily as needed for allergies or rhinitis. 16 g 6  . ibuprofen (ADVIL,MOTRIN) 200 MG tablet Take 400 mg by mouth every 6 (six) hours as needed for moderate pain.    . meloxicam (MOBIC) 15 MG tablet Take 1 tablet (15 mg total) by mouth daily. 90 tablet 2  . Multiple Vitamins-Minerals  (MULTIVITAMIN WITH MINERALS) tablet Take 1 tablet by mouth daily.    Marland Kitchen omega-3 acid ethyl esters (LOVAZA) 1 g capsule Take 1 g by mouth 2 (two) times daily.    . rosuvastatin (CRESTOR) 20 MG tablet Take 1 tablet (20 mg total) by mouth daily. 90 tablet 3  . traZODone (DESYREL) 50 MG tablet TAKE 2 TABLETS(100 MG) BY MOUTH AT BEDTIME 180 tablet 0  . Turmeric 400 MG CAPS Take 400 mg by mouth daily.     No current facility-administered medications for this visit.      Past Surgical History:  Procedure Laterality Date  . APPENDECTOMY    . CARDIAC CATHETERIZATION    . CATARACT EXTRACTION W/PHACO Left 04/25/2016   Procedure: CATARACT EXTRACTION PHACO AND INTRAOCULAR LENS PLACEMENT (IOC);  Surgeon: Baruch Goldmann, MD;  Location: [facility];  Service: Ophthalmology;  Laterality: Left;  CDE 3.23  . CATARACT EXTRACTION W/PHACO Right 05/23/2016   Procedure: CATARACT EXTRACTION PHACO AND INTRAOCULAR LENS PLACEMENT RIGHT EYE CDE=1.11;  Surgeon: Baruch Goldmann, MD;  Location: [facility];  Service: Ophthalmology;  Laterality: Right;  . COLONOSCOPY N/A 10/09/2015   Procedure: COLONOSCOPY;  Surgeon: Daneil Dolin, MD;  Location: [facility];  Service: Endoscopy;  Laterality: N/A;  1230 - pt can't come earlier - transportation issues  . dental implants       No  Known Allergies    Family History  Problem Relation Age of Onset  . Colon cancer Neg Hx      Social History Mr. Fedewa reports that he has been smoking Pipe.  He has never used smokeless tobacco. Mr. Zapanta reports that he drinks about 1.2 oz of alcohol per week .   Review of Systems CONSTITUTIONAL: No weight loss, fever, chills, weakness or fatigue.  HEENT: Eyes: No visual loss, blurred vision, double vision or yellow sclerae.No hearing loss, sneezing, congestion, runny nose or sore throat.  SKIN: No rash or itching.  CARDIOVASCULAR: RRR, no m/rg, no jvd RESPIRATORY: No shortness of breath, cough or sputum.  GASTROINTESTINAL: No anorexia,  nausea, vomiting or diarrhea. No abdominal pain or blood.  GENITOURINARY: No burning on urination, no polyuria NEUROLOGICAL: No headache, dizziness, syncope, paralysis, ataxia, numbness or tingling in the extremities. No change in bowel or bladder control.  MUSCULOSKELETAL: No muscle, back pain, joint pain or stiffness.  LYMPHATICS: No enlarged nodes. No history of splenectomy.  PSYCHIATRIC: No history of depression or anxiety.  ENDOCRINOLOGIC: No reports of sweating, cold or heat intolerance. No polyuria or polydipsia.  Marland Kitchen   Physical Examination Vitals:   10/22/17 1429  BP: 140/82  Pulse: 74  SpO2: 97%   Vitals:   10/22/17 1429  Weight: (!) 324 lb 6.4 oz (147.1 kg)  Height: 6' 4.5" (1.943 m)    Gen: resting comfortably, no acute distress HEENT: no scleral icterus, pupils equal round and reactive, no palptable cervical adenopathy,  CV: RRR, no m/r/g, no jvd Resp: Clear to auscultation bilaterally GI: abdomen is soft, non-tender, non-distended, normal bowel sounds, no hepatosplenomegaly MSK: extremities are warm, trace bilateral edema Skin: warm, no rash Neuro:  no focal deficits Psych: appropriate affect   Diagnostic Studies 01/2004 cath   CORONARY ARTERIOGRAPHY:  No calcification was seen on fluoroscopy.   1. Left main:  Normal.  2. LAD:  The LAD extended down to the apex of the heart and gave rise to one     large diagonal Anelise Staron.  At the bifurcation of the LAD and the diagonal     was an area of approximately 50% narrowing that appeared to involve both     the LAD and the ostium of the first diagonal.  It was best seen in an LAO     cranial view.  Multiple angiograms were taken of this discrete area.     Intracoronary nitroglycerin did not result in any change in the     appearance of this.  The remainder of the LAD and the diagonal were free     of disease.  3. Right coronary artery:  This was a large dominant vessel that was about 4     mm in diameter.  There was  a large PDA and 2 large posterolateral     branches free of disease.  4. Circumflex:  The circumflex in the A-V groove was small after OM #2.  OM     #1 was a large vessel and free of disease.  OM #2 was small and free of     disease.   CONCLUSION:  1. Normal left ventricular systolic function.  2. Fifty percent area of narrowing in the midportion of the left anterior     descending that appears to involve both the left anterior descending and     the ostium of the first diagonal.   DISCUSSION:  My concern is whether or not the above-mentioned lesion is  actually significant or not.  It is in that gray zone and because of this, I  have ordered a stress Cardiolite study.  If it is clearly positive, then he  will need percutaneous intervention, which would be relatively high risk  because of the bifurcation involvement.  I discussed this with the patient  and the patient is scheduled for a Cardiolite in the morning.   Because of his recurrent problems of back discomfort, he was given the  combination of Versed and Nubain during the procedure with good relief of  his back pain.    Assessment and Plan  1. CAD - recent DOE and chest pain. We will initially start workup with echo, pending results likely obtain treadmill nuclear stress pending echo results - if negative cardiac workup, consider PFTs - ekg in clinic today shows SR without ishcemic changes  2. OSA screen - signs and symptoms of OSA, we will refer to Dr Luan Pulling for evaluation  F/u 6 weeks      Arnoldo Lenis, M.D.

## 2017-10-22 NOTE — Patient Instructions (Signed)
Your physician recommends that you schedule a follow-up appointment in: [place] recommends that you continue on your current medications as directed. Please refer to the Current Medication list given to you today.  You have been referred to DR [place] physician has requested that you have an echocardiogram. Echocardiography is a painless test that uses sound waves to create images of your heart. It provides your doctor with information about the size and shape of your heart and how well your heart's chambers and valves are working. This procedure takes approximately one hour. There are no restrictions for this procedure.  Thank you for choosing [facility]!!

## 2017-11-06 ENCOUNTER — Ambulatory Visit (INDEPENDENT_AMBULATORY_CARE_PROVIDER_SITE_OTHER): Payer: 59

## 2017-11-06 ENCOUNTER — Other Ambulatory Visit: Payer: Self-pay

## 2017-11-06 DIAGNOSIS — R0602 Shortness of breath: Secondary | ICD-10-CM

## 2017-11-11 ENCOUNTER — Telehealth: Payer: Self-pay | Admitting: *Deleted

## 2017-11-11 DIAGNOSIS — R0609 Other forms of dyspnea: Secondary | ICD-10-CM

## 2017-11-11 DIAGNOSIS — R079 Chest pain, unspecified: Secondary | ICD-10-CM

## 2017-11-11 NOTE — Telephone Encounter (Signed)
Pt aware and agreeable to stress test. Scheduled 11/21 per schedulers. Pt voiced understanding of verbal prep instructions.  Routed echo results to pcp    Normal echo, please order an exercise nuclear stress test to evaluate for any new blockages as the cause of his symptoms.   Zandra Abts MD

## 2017-11-12 ENCOUNTER — Telehealth: Payer: Self-pay | Admitting: Cardiology

## 2017-11-12 NOTE — Telephone Encounter (Signed)
exercise nuclear stress - Dr. Harl Bowie   vs registration set for 930am  Forestine Na Nov 24, 2017

## 2017-11-17 ENCOUNTER — Other Ambulatory Visit (HOSPITAL_BASED_OUTPATIENT_CLINIC_OR_DEPARTMENT_OTHER): Payer: Self-pay

## 2017-11-17 DIAGNOSIS — G4730 Sleep apnea, unspecified: Secondary | ICD-10-CM

## 2017-11-18 ENCOUNTER — Ambulatory Visit: Payer: 59 | Attending: Pulmonary Disease

## 2017-11-18 DIAGNOSIS — Z79899 Other long term (current) drug therapy: Secondary | ICD-10-CM | POA: Insufficient documentation

## 2017-11-18 DIAGNOSIS — G4730 Sleep apnea, unspecified: Secondary | ICD-10-CM | POA: Diagnosis not present

## 2017-11-18 DIAGNOSIS — Z7982 Long term (current) use of aspirin: Secondary | ICD-10-CM | POA: Insufficient documentation

## 2017-11-19 ENCOUNTER — Encounter (HOSPITAL_COMMUNITY): Payer: 59

## 2017-11-19 ENCOUNTER — Other Ambulatory Visit (HOSPITAL_COMMUNITY): Payer: 59

## 2017-11-24 ENCOUNTER — Encounter (HOSPITAL_BASED_OUTPATIENT_CLINIC_OR_DEPARTMENT_OTHER)
Admission: RE | Admit: 2017-11-24 | Discharge: 2017-11-24 | Disposition: A | Discharge status: Home / Self Care | Payer: 59 | Source: Ambulatory Visit | Attending: Cardiology | Admitting: Cardiology

## 2017-11-24 ENCOUNTER — Encounter (HOSPITAL_COMMUNITY)
Admission: RE | Admit: 2017-11-24 | Discharge: 2017-11-24 | Disposition: A | Discharge status: Home / Self Care | Payer: 59 | Source: Ambulatory Visit | Attending: Cardiology | Admitting: Cardiology

## 2017-11-24 ENCOUNTER — Encounter (HOSPITAL_COMMUNITY): Payer: Self-pay

## 2017-11-24 DIAGNOSIS — R079 Chest pain, unspecified: Secondary | ICD-10-CM | POA: Insufficient documentation

## 2017-11-24 DIAGNOSIS — R0609 Other forms of dyspnea: Secondary | ICD-10-CM | POA: Insufficient documentation

## 2017-11-24 HISTORY — DX: Systemic involvement of connective tissue, unspecified: M35.9

## 2017-11-24 MED ORDER — TECHNETIUM TC 99M TETROFOSMIN IV KIT
10.0000 | PACK | Freq: Once | INTRAVENOUS | Status: AC | PRN
  Administered 2017-11-24: 11 via INTRAVENOUS

## 2017-11-24 MED ORDER — TECHNETIUM TC 99M TETROFOSMIN IV KIT
30.0000 | PACK | Freq: Once | INTRAVENOUS | Status: AC | PRN
  Administered 2017-11-24: 30 via INTRAVENOUS

## 2017-11-24 MED ORDER — SODIUM CHLORIDE 0.9% FLUSH
INTRAVENOUS | Status: AC
  Administered 2017-11-24: 10 mL via INTRAVENOUS
  Filled 2017-11-24: qty 10

## 2017-11-24 MED ORDER — REGADENOSON 0.4 MG/5ML IV SOLN
INTRAVENOUS | Status: AC
  Filled 2017-11-24: qty 5

## 2017-11-25 LAB — NM MYOCAR MULTI W/SPECT W/WALL MOTION / EF
CHL CUP MPHR: 165 {beats}/min
CHL CUP NUCLEAR SDS: 0
CHL CUP NUCLEAR SRS: 0
CHL CUP NUCLEAR SSS: 0
CHL CUP RESTING HR STRESS: 62 {beats}/min
CSEPED: 6 min
CSEPEDS: 0 s
CSEPPHR: 150 {beats}/min
Estimated workload: 7.2 METS
LV sys vol: 58 mL
LVDIAVOL: 118 mL (ref 62–150)
Percent HR: 90 %
RATE: 0.5
RPE: 15
TID: 1.23

## 2017-11-27 ENCOUNTER — Telehealth: Payer: Self-pay | Admitting: *Deleted

## 2017-11-27 NOTE — Telephone Encounter (Signed)
Pt aware and voiced understanding - says symptoms of CP are not any worse only every few days - confirmed 12/6 appt - routed to pcp

## 2017-11-27 NOTE — Telephone Encounter (Signed)
-----   Message from Arnoldo Lenis, MD sent at 11/26/2017  4:57 PM EST ----- Stress test does show some evidence of possibly blockages, though they look to be small in size and when combined with how well he did with the exercise portion would suggest only low to medium risk. Typically for this we initially try medications first. We will discuss further at our upcoming appt. How are his symptoms doing?  Zandra Abts MD

## 2017-11-29 NOTE — Procedures (Unsigned)
  [place] A. Merlene Laughter, MD     [email or web address]             HOME SLEEP TEST  LOCATION: [place]  Patient Name: Daniel Crane, Daniel Crane Date: 11/18/2017 Gender: Male D.O.B: 08-14-1961 Age (years): 55 Referring Provider: Sinda Du Height (inches): 24 Interpreting Physician: Phillips Odor MD, ABSM Weight (lbs): 324 RPSGT: Rosebud Poles BMI: 45 MRN: [id number] Neck Size: CLINICAL INFORMATION Sleep Study Type: HST     Indication for sleep study: N/A     Epworth Sleepiness Score: NA  SLEEP STUDY TECHNIQUE A multi-channel overnight portable sleep study was performed. The channels recorded were: nasal airflow, thoracic respiratory movement, and oxygen saturation with a pulse oximetry. Snoring was also monitored.  MEDICATIONS Patient self administered medications include: N/A.   Current Outpatient Medications:  .  albuterol (PROVENTIL HFA;VENTOLIN HFA) 108 (90 Base) MCG/ACT inhaler, Inhale 2 puffs into the lungs every 6 (six) hours as needed for wheezing or shortness of breath., Disp: 1 Inhaler, Rfl: 0 .  aspirin EC 81 MG tablet, Take 81 mg by mouth daily., Disp: , Rfl:  .  diclofenac sodium (VOLTAREN) 1 % GEL, Apply 2 g topically 4 (four) times daily., Disp: 100 g, Rfl: 2 .  fluticasone (FLONASE) 50 MCG/ACT nasal spray, Place 1 spray into both nostrils 2 (two) times daily as needed for allergies or rhinitis., Disp: 16 g, Rfl: 6 .  ibuprofen (ADVIL,MOTRIN) 200 MG tablet, Take 400 mg by mouth every 6 (six) hours as needed for moderate pain., Disp: , Rfl:  .  meloxicam (MOBIC) 15 MG tablet, Take 1 tablet (15 mg total) by mouth daily., Disp: 90 tablet, Rfl: 2 .  Multiple Vitamins-Minerals (MULTIVITAMIN WITH MINERALS) tablet, Take 1 tablet by mouth daily., Disp: , Rfl:  .  omega-3 acid ethyl esters (LOVAZA) 1 g capsule, Take 1 g by mouth 2 (two) times daily., Disp: , Rfl:  .  rosuvastatin (CRESTOR) 20 MG tablet, Take 1 tablet (20 mg total) by mouth daily.,  Disp: 90 tablet, Rfl: 3 .  traZODone (DESYREL) 50 MG tablet, TAKE 2 TABLETS(100 MG) BY MOUTH AT BEDTIME, Disp: 180 tablet, Rfl: 0 .  Turmeric 400 MG CAPS, Take 400 mg by mouth daily., Disp: , Rfl:   .  RESPIRATORY PARAMETERS The overall AHI was per hour, with a central apnea index of per hour.  The oxygen nadir was 73% during sleep.     CARDIAC DATA Mean heart rate during sleep was bpm.  IMPRESSIONS Severe oxygen desaturation was noted during this study (Min O2 = 73%). A CPAP titration study is recommended.    Delano Metz, MD Diplomate, American Board of Sleep Medicine. ELECTRONICALLY SIGNED ON:  11/29/2017, 4:51 PM [place] PH: [phone]   FX: [phone] [place]

## 2017-12-04 ENCOUNTER — Ambulatory Visit (INDEPENDENT_AMBULATORY_CARE_PROVIDER_SITE_OTHER): Payer: 59 | Admitting: Cardiology

## 2017-12-04 ENCOUNTER — Encounter: Payer: Self-pay | Admitting: Cardiology

## 2017-12-04 VITALS — BP 134/79 | HR 65 | Ht 76.5 in | Wt 325.0 lb

## 2017-12-04 DIAGNOSIS — R0602 Shortness of breath: Secondary | ICD-10-CM | POA: Diagnosis not present

## 2017-12-04 DIAGNOSIS — I2510 Atherosclerotic heart disease of native coronary artery without angina pectoris: Secondary | ICD-10-CM | POA: Diagnosis not present

## 2017-12-04 MED ORDER — ISOSORBIDE MONONITRATE ER 30 MG PO TB24: 15.0000 mg | ORAL_TABLET | Freq: Every day | ORAL | 1 refills | Status: DC

## 2017-12-04 NOTE — Patient Instructions (Signed)
Your physician recommends that you schedule a follow-up appointment in: 2 [place] has recommended you make the following change in your medication:   START IMDUR 15 MG (1/2 TABLET) DAILY  Thank you for choosing [facility]!!

## 2017-12-04 NOTE — Progress Notes (Signed)
Clinical Summary Mr. Botts is a 56 y.o.male seen today for follow up of the following medical problems.   1. CAD - cath 2005 as reported below. 50% LAD/ostial D1. Nuclear stress at that time did not show ischemia, this was managed medically - increased DOE over the last 6 months. Few episodes of chest pain over the last year. Cramping like mid to left chest, 5-6/10 in severity. Occurs primarily at rest. No other assocaited symptoms. Not positional, better with deep breaths. Lasts about 5 minutes. Total 2-3 episodes over the last few months, last episode 3-4 months ago - DOE just walking around the house. DOE with yardwork. About 1 year ago could tolerate without troubles - no coughing or wheezing however does have a tobacco history, he denies having PFTs prevoiusly - Can have some LE edema at times   10/2017 echo: LVEF 60-65%, mild MR - 10/2017 nuclear stress: duke score of 6, apical inferior, basal inferoseptal, inferolateral reversible defects. Overall intermediate risk study.  - Still with some SOB at times. No recurrent pain.   2. OSA screen + snoring, + apneic episodes, +fatigue - recent sleep test   Past Medical History:  Diagnosis Date  . Anginal pain ([place])    history of 11 years ago. No meds at present.  . Arthritis   . Collagen vascular disease ([place])   . Heart disease    catheterization in remote past, medically managed  . Hypercholesteremia      No Known Allergies   Current Outpatient Medications  Medication Sig Dispense Refill  . albuterol (PROVENTIL HFA;VENTOLIN HFA) 108 (90 Base) MCG/ACT inhaler Inhale 2 puffs into the lungs every 6 (six) hours as needed for wheezing or shortness of breath. 1 Inhaler 0  . aspirin EC 81 MG tablet Take 81 mg by mouth daily.    . diclofenac sodium (VOLTAREN) 1 % GEL Apply 2 g topically 4 (four) times daily. 100 g 2  . fluticasone (FLONASE) 50 MCG/ACT nasal spray Place 1 spray into both nostrils 2 (two) times daily as  needed for allergies or rhinitis. 16 g 6  . ibuprofen (ADVIL,MOTRIN) 200 MG tablet Take 400 mg by mouth every 6 (six) hours as needed for moderate pain.    . meloxicam (MOBIC) 15 MG tablet Take 1 tablet (15 mg total) by mouth daily. 90 tablet 2  . Multiple Vitamins-Minerals (MULTIVITAMIN WITH MINERALS) tablet Take 1 tablet by mouth daily.    Marland Kitchen omega-3 acid ethyl esters (LOVAZA) 1 g capsule Take 1 g by mouth 2 (two) times daily.    . rosuvastatin (CRESTOR) 20 MG tablet Take 1 tablet (20 mg total) by mouth daily. 90 tablet 3  . traZODone (DESYREL) 50 MG tablet TAKE 2 TABLETS(100 MG) BY MOUTH AT BEDTIME 180 tablet 0  . Turmeric 400 MG CAPS Take 400 mg by mouth daily.     No current facility-administered medications for this visit.      Past Surgical History:  Procedure Laterality Date  . APPENDECTOMY    . CARDIAC CATHETERIZATION    . CATARACT EXTRACTION W/PHACO Left 04/25/2016   Procedure: CATARACT EXTRACTION PHACO AND INTRAOCULAR LENS PLACEMENT (IOC);  Surgeon: Baruch Goldmann, MD;  Location: [facility];  Service: Ophthalmology;  Laterality: Left;  CDE 3.23  . CATARACT EXTRACTION W/PHACO Right 05/23/2016   Procedure: CATARACT EXTRACTION PHACO AND INTRAOCULAR LENS PLACEMENT RIGHT EYE CDE=1.11;  Surgeon: Baruch Goldmann, MD;  Location: [facility];  Service: Ophthalmology;  Laterality: Right;  . COLONOSCOPY N/A  10/09/2015   Procedure: COLONOSCOPY;  Surgeon: Daneil Dolin, MD;  Location: [facility];  Service: Endoscopy;  Laterality: N/A;  1230 - pt can't come earlier - transportation issues  . dental implants       No Known Allergies    Family History  Problem Relation Age of Onset  . Colon cancer Neg Hx      Social History Mr. Gilliam reports that he has been smoking pipe.  he has never used smokeless tobacco. Mr. Dillion reports that he drinks about 1.2 oz of alcohol per week.   Review of Systems CONSTITUTIONAL: No weight loss, fever, chills, weakness or fatigue.  HEENT: Eyes: No visual  loss, blurred vision, double vision or yellow sclerae.No hearing loss, sneezing, congestion, runny nose or sore throat.  SKIN: No rash or itching.  CARDIOVASCULAR: per hpi RESPIRATORY: per hpi GASTROINTESTINAL: No anorexia, nausea, vomiting or diarrhea. No abdominal pain or blood.  GENITOURINARY: No burning on urination, no polyuria NEUROLOGICAL: No headache, dizziness, syncope, paralysis, ataxia, numbness or tingling in the extremities. No change in bowel or bladder control.  MUSCULOSKELETAL: No muscle, back pain, joint pain or stiffness.  LYMPHATICS: No enlarged nodes. No history of splenectomy.  PSYCHIATRIC: No history of depression or anxiety.  ENDOCRINOLOGIC: No reports of sweating, cold or heat intolerance. No polyuria or polydipsia.  Marland Kitchen   Physical Examination Vitals:   12/04/17 1446  BP: 134/79  Pulse: 65   Vitals:   12/04/17 1446  Weight: (!) 325 lb (147.4 kg)  Height: 6' 4.5" (1.943 m)    Gen: resting comfortably, no acute distress HEENT: no scleral icterus, pupils equal round and reactive, no palptable cervical adenopathy,  CV: RRR, no m/r/g, no jvd Resp: Clear to auscultation bilaterally GI: abdomen is soft, non-tender, non-distended, normal bowel sounds, no hepatosplenomegaly MSK: extremities are warm, no edema.  Skin: warm, no rash Neuro:  no focal deficits Psych: appropriate affect   Diagnostic Studies 01/2004 cath  CORONARY ARTERIOGRAPHY: No calcification was seen on fluoroscopy.  1. Left main: Normal. 2. LAD: The LAD extended down to the apex of the heart and gave rise to one large diagonal [place]. At the bifurcation of the LAD and the diagonal was an area of approximately 50% narrowing that appeared to involve both the LAD and the ostium of the first diagonal. It was best seen in an LAO cranial view. Multiple angiograms were taken of this discrete area. Intracoronary nitroglycerin did not result in any change in  the appearance of this. The remainder of the LAD and the diagonal were free of disease. 3. Right coronary artery: This was a large dominant vessel that was about 4 mm in diameter. There was a large PDA and 2 large posterolateral branches free of disease. 4. Circumflex: The circumflex in the A-V groove was small after OM #2. OM #1 was a large vessel and free of disease. OM #2 was small and free of disease.  CONCLUSION: 1. Normal left ventricular systolic function. 2. Fifty percent area of narrowing in the midportion of the left anterior descending that appears to involve both the left anterior descending and the ostium of the first diagonal.  DISCUSSION: My concern is whether or not the above-mentioned lesion is actually significant or not. It is in that gray zone and because of this, I have ordered a stress Cardiolite study. If it is clearly positive, then he will need percutaneous intervention, which would be relatively high risk because of the bifurcation involvement. I discussed this  with the patient and the patient is scheduled for a Cardiolite in the morning.  Because of his recurrent problems of back discomfort, he was given the combination of Versed and Nubain during the procedure with good relief of his back pain.   10/2017 echo Study Conclusions  - Left ventricle: The cavity size was normal. Wall thickness was   increased in a pattern of mild LVH. Systolic function was normal.   The estimated ejection fraction was in the range of 60% to 65%.   Left ventricular diastolic function parameters were normal. - Aortic valve: Mildly calcified annulus. Normal thickness   leaflets. Valve area (VTI): 4.12 cm^2. Valve area (Vmax): 3.73   cm^2. Valve area (Vmean): 3.72 cm^2. - Mitral valve: There was mild regurgitation. - Atrial septum: No defect or patent foramen ovale was identified. - Technically difficult  study.   Assessment and Plan  1. CAD/SOB - recent stress test intermediate risk. We will start imdur 15mg  daily and follow symptoms, pending progress may need consideration for cath    F/u 2 months      Arnoldo Lenis, M.D.

## 2017-12-09 ENCOUNTER — Encounter: Payer: Self-pay | Admitting: Cardiology

## 2018-01-07 ENCOUNTER — Other Ambulatory Visit: Payer: Self-pay | Admitting: Family Medicine

## 2018-01-07 DIAGNOSIS — G4700 Insomnia, unspecified: Secondary | ICD-10-CM

## 2018-02-05 ENCOUNTER — Encounter: Payer: Self-pay | Admitting: Cardiology

## 2018-02-05 ENCOUNTER — Ambulatory Visit (INDEPENDENT_AMBULATORY_CARE_PROVIDER_SITE_OTHER): Payer: 59 | Admitting: Cardiology

## 2018-02-05 VITALS — BP 148/88 | HR 79 | Ht 76.5 in | Wt 324.2 lb

## 2018-02-05 DIAGNOSIS — I2510 Atherosclerotic heart disease of native coronary artery without angina pectoris: Secondary | ICD-10-CM

## 2018-02-05 DIAGNOSIS — G4730 Sleep apnea, unspecified: Secondary | ICD-10-CM | POA: Diagnosis not present

## 2018-02-05 DIAGNOSIS — R0602 Shortness of breath: Secondary | ICD-10-CM

## 2018-02-05 NOTE — Patient Instructions (Signed)
Your physician recommends that you schedule a follow-up appointment in: [place]  Your physician recommends that you continue on your current medications as directed. Please refer to the Current Medication list given to you today.  You have been referred to [facility]   Thank you for choosing [facility]!!

## 2018-02-05 NOTE — Progress Notes (Signed)
Clinical Summary Mr. Ulin is a 57 y.o.male seen today for follow up of the following medical problems.     1. CAD -cath 2005 as reported below. 50% LAD/ostial D1. Nuclear stress at that time did not show ischemia, this was managed medically - significant SOB reported in 10/2017 -10/2017 echo: LVEF 60-65%, mild MR - 10/2017 nuclear stress: duke score of 6, apical inferior, basal inferoseptal, inferolateral reversible defects. Overall intermediate risk study.    - no chest pain. Breathing has improved. He has stopped smoking his pipe. Now vaping.  - imdur reports some arthritis pain since taking   2. OSA screen + snoring, + apneic episodes, +fatigue - recent sleep test, awaiting f/u with Dr Luan Pulling    Past Medical History:  Diagnosis Date  . Anginal pain ([place])    history of 11 years ago. No meds at present.  . Arthritis   . Collagen vascular disease ([place])   . Heart disease    catheterization in remote past, medically managed  . Hypercholesteremia      No Known Allergies   Current Outpatient Medications  Medication Sig Dispense Refill  . albuterol (PROVENTIL HFA;VENTOLIN HFA) 108 (90 Base) MCG/ACT inhaler Inhale 2 puffs into the lungs every 6 (six) hours as needed for wheezing or shortness of breath. 1 Inhaler 0  . aspirin EC 81 MG tablet Take 81 mg by mouth daily.    . diclofenac sodium (VOLTAREN) 1 % GEL Apply 2 g topically 4 (four) times daily. 100 g 2  . fluticasone (FLONASE) 50 MCG/ACT nasal spray Place 1 spray into both nostrils 2 (two) times daily as needed for allergies or rhinitis. 16 g 6  . ibuprofen (ADVIL,MOTRIN) 200 MG tablet Take 400 mg by mouth every 6 (six) hours as needed for moderate pain.    . isosorbide mononitrate (IMDUR) 30 MG 24 hr tablet Take 0.5 tablets (15 mg total) by mouth daily. 45 tablet 1  . MAGNESIUM PO Take 2 tablets by mouth 2 (two) times daily.    . meloxicam (MOBIC) 15 MG tablet Take 1 tablet (15 mg total) by mouth daily. 90  tablet 2  . Multiple Vitamins-Minerals (MULTIVITAMIN WITH MINERALS) tablet Take 1 tablet by mouth daily.    Marland Kitchen omega-3 acid ethyl esters (LOVAZA) 1 g capsule Take 1 g by mouth 2 (two) times daily.    . rosuvastatin (CRESTOR) 20 MG tablet Take 1 tablet (20 mg total) by mouth daily. 90 tablet 3  . traZODone (DESYREL) 50 MG tablet TAKE 2 TABLETS BY MOUTH AT BEDTIME 120 tablet 0  . Turmeric 400 MG CAPS Take 400 mg by mouth daily.     No current facility-administered medications for this visit.      Past Surgical History:  Procedure Laterality Date  . APPENDECTOMY    . CARDIAC CATHETERIZATION    . CATARACT EXTRACTION W/PHACO Left 04/25/2016   Procedure: CATARACT EXTRACTION PHACO AND INTRAOCULAR LENS PLACEMENT (IOC);  Surgeon: Baruch Goldmann, MD;  Location: [facility];  Service: Ophthalmology;  Laterality: Left;  CDE 3.23  . CATARACT EXTRACTION W/PHACO Right 05/23/2016   Procedure: CATARACT EXTRACTION PHACO AND INTRAOCULAR LENS PLACEMENT RIGHT EYE CDE=1.11;  Surgeon: Baruch Goldmann, MD;  Location: [facility];  Service: Ophthalmology;  Laterality: Right;  . COLONOSCOPY N/A 10/09/2015   Procedure: COLONOSCOPY;  Surgeon: Daneil Dolin, MD;  [facility];  Service: Endoscopy;  Laterality: N/A;  1230 - pt can't come earlier - transportation issues  . dental implants  No Known Allergies    Family History  Problem Relation Age of Onset  . Other Mother        Chron's Dz  . Other Father        shot by wife, patient's step mother  . Alcoholism Father   . Hypertension Father   . Other Sister        h/o substance abuse  . Thyroid disease Sister   . Hypertension Brother   . Heart attack Paternal Grandfather   . Stroke Brother   . Colon cancer Neg Hx      Social History Mr. Guaman reports that he has been smoking pipe and cigarettes.  He started smoking about 39 years ago. he has never used smokeless tobacco. Mr. Goncalves reports that he drinks about 1.2 oz of alcohol per  week.   Review of Systems CONSTITUTIONAL: No weight loss, fever, chills, weakness or fatigue.  HEENT: Eyes: No visual loss, blurred vision, double vision or yellow sclerae.No hearing loss, sneezing, congestion, runny nose or sore throat.  SKIN: No rash or itching.  CARDIOVASCULAR: per hpi RESPIRATORY:per hpi GASTROINTESTINAL: No anorexia, nausea, vomiting or diarrhea. No abdominal pain or blood.  GENITOURINARY: No burning on urination, no polyuria NEUROLOGICAL: No headache, dizziness, syncope, paralysis, ataxia, numbness or tingling in the extremities. No change in bowel or bladder control.  MUSCULOSKELETAL: No muscle, back pain, joint pain or stiffness.  LYMPHATICS: No enlarged nodes. No history of splenectomy.  PSYCHIATRIC: No history of depression or anxiety.  ENDOCRINOLOGIC: No reports of sweating, cold or heat intolerance. No polyuria or polydipsia.  Marland Kitchen   Physical Examination Vitals:   02/05/18 1530  BP: (!) 148/88  Pulse: 79  SpO2: 95%   Vitals:   02/05/18 1530  Weight: (!) 324 lb 3.2 oz (147.1 kg)  Height: 6' 4.5" (1.943 m)    Gen: resting comfortably, no acute distress HEENT: no scleral icterus, pupils equal round and reactive, no palptable cervical adenopathy,  CV: RRR, no m/r/g, no jvd Resp: Clear to auscultation bilaterally GI: abdomen is soft, non-tender, non-distended, normal bowel sounds, no hepatosplenomegaly MSK: extremities are warm, no edema.  Skin: warm, no rash Neuro:  no focal deficits Psych: appropriate affect   Diagnostic Studies 01/2004 cath  CORONARY ARTERIOGRAPHY: No calcification was seen on fluoroscopy.  1. Left main: Normal. 2. LAD: The LAD extended down to the apex of the heart and gave rise to one large diagonal branch. At the bifurcation of the LAD and the diagonal was an area of approximately 50% narrowing that appeared to involve both the LAD and the ostium of the first diagonal. It was best seen in an  LAO cranial view. Multiple angiograms were taken of this discrete area. Intracoronary nitroglycerin did not result in any change in the appearance of this. The remainder of the LAD and the diagonal were free of disease. 3. Right coronary artery: This was a large dominant vessel that was about 4 mm in diameter. There was a large PDA and 2 large posterolateral branches free of disease. 4. Circumflex: The circumflex in the A-V groove was small after OM #2. OM #1 was a large vessel and free of disease. OM #2 was small and free of disease.  CONCLUSION: 1. Normal left ventricular systolic function. 2. Fifty percent area of narrowing in the midportion of the left anterior descending that appears to involve both the left anterior descending and the ostium of the first diagonal.  DISCUSSION: My concern is whether or not the  above-mentioned lesion is actually significant or not. It is in that gray zone and because of this, I have ordered a stress Cardiolite study. If it is clearly positive, then he will need percutaneous intervention, which would be relatively high risk because of the bifurcation involvement. I discussed this with the patient and the patient is scheduled for a Cardiolite in the morning.  Because of his recurrent problems of back discomfort, he was given the combination of Versed and Nubain during the procedure with good relief of his back pain.   10/2017 echo Study Conclusions  - Left ventricle: The cavity size was normal. Wall thickness was increased in a pattern of mild LVH. Systolic function was normal. The estimated ejection fraction was in the range of 60% to 65%. Left ventricular diastolic function parameters were normal. - Aortic valve: Mildly calcified annulus. Normal thickness leaflets. Valve area (VTI): 4.12 cm^2. Valve area (Vmax): 3.73 cm^2. Valve area (Vmean): 3.72 cm^2. -  Mitral valve: There was mild regurgitation. - Atrial septum: No defect or patent foramen ovale was identified. - Technically difficult study.  10/2017 nuclear stress  No diagnostic ST segment changes to indicate ischemia. No chest pain reported. Hypertensive response noted. Patient achieved 7.2 METS. Low risk Duke treadmill score of 6.  Blood pressure demonstrated a hypertensive response to exercise.  Medium, mild intensity, partially reversible apical inferior and basal inferoseptal as well as inferolateral defects that suggest mild ischemic territories, although noted in the presence of diaphragmatic attenuation. TID ratio borderline increased at 1.23.  This is an intermediate risk study based on perfusion imaging.  Nuclear stress EF: 51%.  Assessment and Plan  1. CAD/SOB - SOB improved since last visit, recent cardiac testing fairly unremarkable.  - continue to monitor at this time.    F/u 4 months   Arnoldo Lenis, M.D.

## 2018-02-07 ENCOUNTER — Other Ambulatory Visit: Payer: Self-pay | Admitting: Family Medicine

## 2018-02-08 ENCOUNTER — Encounter: Payer: Self-pay | Admitting: Cardiology

## 2018-03-18 ENCOUNTER — Other Ambulatory Visit: Payer: Self-pay | Admitting: Family Medicine

## 2018-03-18 DIAGNOSIS — G4700 Insomnia, unspecified: Secondary | ICD-10-CM

## 2018-05-25 ENCOUNTER — Other Ambulatory Visit: Payer: Self-pay | Admitting: Family Medicine

## 2018-05-25 DIAGNOSIS — G4700 Insomnia, unspecified: Secondary | ICD-10-CM

## 2018-05-26 NOTE — Telephone Encounter (Signed)
Last seen 10/03/17  Dr D

## 2018-06-12 ENCOUNTER — Ambulatory Visit: Payer: 59 | Admitting: Cardiology

## 2018-06-15 ENCOUNTER — Ambulatory Visit: Payer: 59 | Admitting: Cardiology

## 2018-06-16 ENCOUNTER — Ambulatory Visit (INDEPENDENT_AMBULATORY_CARE_PROVIDER_SITE_OTHER): Payer: 59 | Admitting: Cardiology

## 2018-06-16 ENCOUNTER — Encounter: Payer: Self-pay | Admitting: Cardiology

## 2018-06-16 VITALS — BP 124/75 | HR 74 | Ht 76.0 in | Wt 328.4 lb

## 2018-06-16 DIAGNOSIS — E782 Mixed hyperlipidemia: Secondary | ICD-10-CM

## 2018-06-16 DIAGNOSIS — I2510 Atherosclerotic heart disease of native coronary artery without angina pectoris: Secondary | ICD-10-CM | POA: Diagnosis not present

## 2018-06-16 NOTE — Progress Notes (Signed)
Clinical Summary [name] is a 57 y.o.male seen today for follow up of the following medical problems.    1. CAD -cath 2005 as reported below. 50% LAD/ostial D1. Nuclear stress at that time did not show ischemia, this was managed medically - significant SOB reported in 10/2017 -10/2017 echo: LVEF 60-65%, mild MR - 10/2017 nuclear stress: duke score of 6, apical inferior, basal inferoseptal, inferolateral reversible defects. Overall intermediate risk study.   - no recent chest pain. No SOB/DOE - compliant with meds   2. OSA  - abnormal sleep study 10/2017, followed by Dr Luan Pulling.      Past Medical History:  Diagnosis Date  . Anginal pain ([place])    history of 11 years ago. No meds at present.  . Arthritis   . Collagen vascular disease ([place])   . Heart disease    catheterization in remote past, medically managed  . Hypercholesteremia      No Known Allergies   Current Outpatient Medications  Medication Sig Dispense Refill  . albuterol (PROVENTIL HFA;VENTOLIN HFA) 108 (90 Base) MCG/ACT inhaler Inhale 2 puffs into the lungs every 6 (six) hours as needed for wheezing or shortness of breath. 1 Inhaler 0  . aspirin EC 81 MG tablet Take 81 mg by mouth daily.    . diclofenac sodium (VOLTAREN) 1 % GEL Apply 2 g topically 4 (four) times daily. 100 g 2  . fluticasone (FLONASE) 50 MCG/ACT nasal spray Place 1 spray into both nostrils 2 (two) times daily as needed for allergies or rhinitis. 16 g 6  . ibuprofen (ADVIL,MOTRIN) 200 MG tablet Take 400 mg by mouth every 6 (six) hours as needed for moderate pain.    . isosorbide mononitrate (IMDUR) 30 MG 24 hr tablet Take 0.5 tablets (15 mg total) by mouth daily. 45 tablet 1  . MAGNESIUM PO Take 2 tablets by mouth 2 (two) times daily.    . meloxicam (MOBIC) 15 MG tablet TAKE 1 TABLET BY MOUTH ONCE DAILY 90 tablet 2  . Multiple Vitamins-Minerals (MULTIVITAMIN WITH MINERALS) tablet Take 1 tablet by mouth daily.    Marland Kitchen omega-3 acid  ethyl esters (LOVAZA) 1 g capsule Take 1 g by mouth 2 (two) times daily.    . rosuvastatin (CRESTOR) 20 MG tablet Take 1 tablet (20 mg total) by mouth daily. 90 tablet 3  . traZODone (DESYREL) 50 MG tablet TAKE 2 TABLETS BY MOUTH AT BEDTIME 120 tablet 0  . Turmeric 400 MG CAPS Take 400 mg by mouth daily.     No current facility-administered medications for this visit.      Past Surgical History:  Procedure Laterality Date  . APPENDECTOMY    . CARDIAC CATHETERIZATION    . CATARACT EXTRACTION W/PHACO Left 04/25/2016   Procedure: CATARACT EXTRACTION PHACO AND INTRAOCULAR LENS PLACEMENT (IOC);  Surgeon: Baruch Goldmann, MD;  Location: [facility];  Service: Ophthalmology;  Laterality: Left;  CDE 3.23  . CATARACT EXTRACTION W/PHACO Right 05/23/2016   Procedure: CATARACT EXTRACTION PHACO AND INTRAOCULAR LENS PLACEMENT RIGHT EYE CDE=1.11;  Surgeon: Baruch Goldmann, MD;  Location: [facility];  Service: Ophthalmology;  Laterality: Right;  . COLONOSCOPY N/A 10/09/2015   Procedure: COLONOSCOPY;  Surgeon: Daneil Dolin, MD;  Location: [facility];  Service: Endoscopy;  Laterality: N/A;  1230 - pt can't come earlier - transportation issues  . dental implants       No Known Allergies    Family History  Problem Relation Age of Onset  .  Other Mother        Chron's Dz  . Other Father        shot by wife, patient's step mother  . Alcoholism Father   . Hypertension Father   . Other Sister        h/o substance abuse  . Thyroid disease Sister   . Hypertension Brother   . Heart attack Paternal Grandfather   . Stroke Brother   . Colon cancer Neg Hx      Social History Mr. Fann reports that he has been smoking pipe and cigarettes.  He started smoking about 39 years ago. He has never used smokeless tobacco. Mr. Prisk reports that he drinks about 1.2 oz of alcohol per week.   Review of Systems CONSTITUTIONAL: No weight loss, fever, chills, weakness or fatigue.  HEENT: Eyes: No visual loss, blurred  vision, double vision or yellow sclerae.No hearing loss, sneezing, congestion, runny nose or sore throat.  SKIN: No rash or itching.  CARDIOVASCULAR: per hpi RESPIRATORY: per hpi GASTROINTESTINAL: No anorexia, nausea, vomiting or diarrhea. No abdominal pain or blood.  GENITOURINARY: No burning on urination, no polyuria NEUROLOGICAL: No headache, dizziness, syncope, paralysis, ataxia, numbness or tingling in the extremities. No change in bowel or bladder control.  MUSCULOSKELETAL: No muscle, back pain, joint pain or stiffness.  LYMPHATICS: No enlarged nodes. No history of splenectomy.  PSYCHIATRIC: No history of depression or anxiety.  ENDOCRINOLOGIC: No reports of sweating, cold or heat intolerance. No polyuria or polydipsia.  Marland Kitchen   Physical Examination Vitals:   06/16/18 1516  BP: 124/75  Pulse: 74  SpO2: 97%   Vitals:   06/16/18 1516  Weight: (!) 328 lb 6.4 oz (149 kg)  Height: 6\' 4"  (1.93 m)    Gen: resting comfortably, no acute distress HEENT: no scleral icterus, pupils equal round and reactive, no palptable cervical adenopathy,  CV: RRR, no m/r/g, no jvd Resp: Clear to auscultation bilaterally GI: abdomen is soft, non-tender, non-distended, normal bowel sounds, no hepatosplenomegaly MSK: extremities are warm, no edema.  Skin: warm, no rash Neuro:  no focal deficits Psych: appropriate affect   Diagnostic Studies 01/2004 cath  CORONARY ARTERIOGRAPHY: No calcification was seen on fluoroscopy.  1. Left main: Normal. 2. LAD: The LAD extended down to the apex of the heart and gave rise to one large diagonal Polette Nofsinger. At the bifurcation of the LAD and the diagonal was an area of approximately 50% narrowing that appeared to involve both the LAD and the ostium of the first diagonal. It was best seen in an LAO cranial view. Multiple angiograms were taken of this discrete area. Intracoronary nitroglycerin did not result in any change in  the appearance of this. The remainder of the LAD and the diagonal were free of disease. 3. Right coronary artery: This was a large dominant vessel that was about 4 mm in diameter. There was a large PDA and 2 large posterolateral branches free of disease. 4. Circumflex: The circumflex in the A-V groove was small after OM #2. OM #1 was a large vessel and free of disease. OM #2 was small and free of disease.  CONCLUSION: 1. Normal left ventricular systolic function. 2. Fifty percent area of narrowing in the midportion of the left anterior descending that appears to involve both the left anterior descending and the ostium of the first diagonal.  DISCUSSION: My concern is whether or not the above-mentioned lesion is actually significant or not. It is in that gray zone and because of  this, I have ordered a stress Cardiolite study. If it is clearly positive, then he will need percutaneous intervention, which would be relatively high risk because of the bifurcation involvement. I discussed this with the patient and the patient is scheduled for a Cardiolite in the morning.  Because of his recurrent problems of back discomfort, he was given the combination of Versed and Nubain during the procedure with good relief of his back pain.   10/2017 echo Study Conclusions  - Left ventricle: The cavity size was normal. Wall thickness was increased in a pattern of mild LVH. Systolic function was normal. The estimated ejection fraction was in the range of 60% to 65%. Left ventricular diastolic function parameters were normal. - Aortic valve: Mildly calcified annulus. Normal thickness leaflets. Valve area (VTI): 4.12 cm^2. Valve area (Vmax): 3.73 cm^2. Valve area (Vmean): 3.72 cm^2. - Mitral valve: There was mild regurgitation. - Atrial septum: No defect or patent foramen ovale was identified. - Technically difficult  study.  10/2017 nuclear stress  No diagnostic ST segment changes to indicate ischemia. No chest pain reported. Hypertensive response noted. Patient achieved 7.2 METS. Low risk Duke treadmill score of 6.  Blood pressure demonstrated a hypertensive response to exercise.  Medium, mild intensity, partially reversible apical inferior and basal inferoseptal as well as inferolateral defects that suggest mild ischemic territories, although noted in the presence of diaphragmatic attenuation. TID ratio borderline increased at 1.23.  This is an intermediate risk study based on perfusion imaging.  Nuclear stress EF: 51%.     Assessment and Plan  1. CAD - no recent symptoms, continue medical therapy   2. Hyperlipidemia - he is on statin in setting of CAD. - mild uptrend in LDL over the last year, working on lifesytle modification, if persists would increase crestor to 40mg  daily to drive LDL to <50        Arnoldo Lenis, M.D.

## 2018-06-16 NOTE — Patient Instructions (Signed)
Medication Instructions:  Your physician recommends that you continue on your current medications as directed. Please refer to the Current Medication list given to you today.  Labwork: NONE  Testing/Procedures: NONE  Follow-Up: Your physician wants you to follow-up in: 6 MONTHS WITH DR. BRANCH. You will receive a reminder letter in the mail two months in advance. If you don't receive a letter, please call our office to schedule the follow-up appointment.  Any Other Special Instructions Will Be Listed Below (If Applicable).  If you need a refill on your cardiac medications before your next appointment, please call your pharmacy. 

## 2018-06-21 ENCOUNTER — Encounter: Payer: Self-pay | Admitting: Cardiology

## 2018-06-26 ENCOUNTER — Other Ambulatory Visit: Payer: Self-pay | Admitting: Cardiology

## 2018-06-26 ENCOUNTER — Other Ambulatory Visit: Payer: Self-pay | Admitting: Family Medicine

## 2018-06-26 DIAGNOSIS — G4700 Insomnia, unspecified: Secondary | ICD-10-CM

## 2018-06-29 ENCOUNTER — Other Ambulatory Visit: Payer: Self-pay | Admitting: Family Medicine

## 2018-06-29 DIAGNOSIS — G4700 Insomnia, unspecified: Secondary | ICD-10-CM

## 2018-07-06 ENCOUNTER — Other Ambulatory Visit: Payer: Self-pay | Admitting: Family Medicine

## 2018-07-06 DIAGNOSIS — G4700 Insomnia, unspecified: Secondary | ICD-10-CM

## 2018-08-19 ENCOUNTER — Encounter: Payer: Self-pay | Admitting: Internal Medicine

## 2019-04-23 ENCOUNTER — Telehealth: Payer: Self-pay | Admitting: Cardiology

## 2019-04-23 NOTE — Telephone Encounter (Signed)
Virtual Visit Pre-Appointment Phone Call  "(Name), I am calling you today to discuss your upcoming appointment. We are currently trying to limit exposure to the virus that causes COVID-19 by seeing patients at home rather than in the office."  1. "What is the BEST phone number to call the day of the visit?" - include this in appointment notes  2. Do you have or have access to (through a family member/friend) a smartphone with video capability that we can use for your visit?" a. If yes - list this number in appt notes as cell (if different from BEST phone #) and list the appointment type as a VIDEO visit in appointment notes b. If no - list the appointment type as a PHONE visit in appointment notes  3. Confirm consent - "In the setting of the current Covid19 crisis, you are scheduled for a (phone or video) visit with your provider on (date) at (time).  Just as we do with many in-office visits, in order for you to participate in this visit, we must obtain consent.  If you'd like, I can send this to your mychart (if signed up) or email for you to review.  Otherwise, I can obtain your verbal consent now.  All virtual visits are billed to your insurance company just like a normal visit would be.  By agreeing to a virtual visit, we'd like you to understand that the technology does not allow for your provider to perform an examination, and thus may limit your provider's ability to fully assess your condition. If your provider identifies any concerns that need to be evaluated in person, we will make arrangements to do so.  Finally, though the technology is pretty good, we cannot assure that it will always work on either your or our end, and in the setting of a video visit, we may have to convert it to a phone-only visit.  In either situation, we cannot ensure that we have a secure connection.  Are you willing to proceed?" STAFF: Did the patient verbally acknowledge consent to telehealth visit? Document  YES/NO here: yes  4. Advise patient to be prepared - "Two hours prior to your appointment, go ahead and check your blood pressure, pulse, oxygen saturation, and your weight (if you have the equipment to check those) and write them all down. When your visit starts, your provider will ask you for this information. If you have an Apple Watch or Kardia device, please plan to have heart rate information ready on the day of your appointment. Please have a pen and paper handy nearby the day of the visit as well."  5. Give patient instructions for MyChart download to smartphone OR [email or web address] as below if video visit (depending on what platform provider is using)  6. Inform patient they will receive a phone call 15 minutes prior to their appointment time (may be from unknown caller ID) so they should be prepared to answer    TELEPHONE CALL NOTE  Daniel Crane has been deemed a candidate for a follow-up tele-health visit to limit community exposure during the Covid-19 pandemic. I spoke with the patient via phone to ensure availability of phone/video source, confirm preferred email & phone number, and discuss instructions and expectations.  I reminded Daniel Crane to be prepared with any vital sign and/or heart rhythm information that could potentially be obtained via home monitoring, at the time of his visit. I reminded Daniel Crane to expect a phone call prior to  his visit.  Daniel Crane 04/23/2019 1:03 PM   INSTRUCTIONS FOR DOWNLOADING THE MYCHART APP TO SMARTPHONE  - The patient must first make sure to have activated MyChart and know their login information - If Apple, go to [employer] and type in MyChart in the search bar and download the app. If Android, ask patient to go to [employer] and type in [place] in the search bar and download the app. The app is free but as with any other app downloads, their phone may require them to verify saved payment information or Apple/Android  password.  - The patient will need to then log into the app with their MyChart username and password, and select [facility] as their healthcare provider to link the account. When it is time for your visit, go to the MyChart app, find appointments, and click Begin Video Visit. Be sure to Select Allow for your device to access the Microphone and Camera for your visit. You will then be connected, and your provider will be with you shortly.  **If they have any issues connecting, or need assistance please contact MyChart service desk [phone])**  **If using a computer, in order to ensure the best quality for their visit they will need to use either of the following Internet Browsers: [employer], or Google Chrome**  IF USING DOXIMITY or DOXY.ME - The patient will receive a link just prior to their visit by text.     FULL LENGTH CONSENT FOR TELE-HEALTH VISIT   I hereby voluntarily request, consent and authorize [facility] and its employed or contracted physicians, physician assistants, nurse practitioners or other licensed health care professionals (the Practitioner), to provide me with telemedicine health care services (the Services") as deemed necessary by the treating Practitioner. I acknowledge and consent to receive the Services by the Practitioner via telemedicine. I understand that the telemedicine visit will involve communicating with the Practitioner through live audiovisual communication technology and the disclosure of certain medical information by electronic transmission. I acknowledge that I have been given the opportunity to request an in-person assessment or other available alternative prior to the telemedicine visit and am voluntarily participating in the telemedicine visit.  I understand that I have the right to withhold or withdraw my consent to the use of telemedicine in the course of my care at any time, without affecting my right to future care or treatment,  and that the Practitioner or I may terminate the telemedicine visit at any time. I understand that I have the right to inspect all information obtained and/or recorded in the course of the telemedicine visit and may receive copies of available information for a reasonable fee.  I understand that some of the potential risks of receiving the Services via telemedicine include:   Delay or interruption in medical evaluation due to technological equipment failure or disruption;  Information transmitted may not be sufficient (e.g. poor resolution of images) to allow for appropriate medical decision making by the Practitioner; and/or   In rare instances, security protocols could fail, causing a breach of personal health information.  Furthermore, I acknowledge that it is my responsibility to provide information about my medical history, conditions and care that is complete and accurate to the best of my ability. I acknowledge that Practitioner's advice, recommendations, and/or decision may be based on factors not within their control, such as incomplete or inaccurate data provided by me or distortions of diagnostic images or specimens that may result from electronic transmissions. I  understand that the practice of medicine is not an exact science and that Practitioner makes no warranties or guarantees regarding treatment outcomes. I acknowledge that I will receive a copy of this consent concurrently upon execution via email to the email address I last provided but may also request a printed copy by calling the office of [facility].    I understand that my insurance will be billed for this visit.   I have read or had this consent read to me.  I understand the contents of this consent, which adequately explains the benefits and risks of the Services being provided via telemedicine.   I have been provided ample opportunity to ask questions regarding this consent and the Services and have had my questions  answered to my satisfaction.  I give my informed consent for the services to be provided through the use of telemedicine in my medical care  By participating in this telemedicine visit I agree to the above.

## 2019-04-27 ENCOUNTER — Encounter: Payer: Self-pay | Admitting: Cardiology

## 2019-04-27 ENCOUNTER — Telehealth (INDEPENDENT_AMBULATORY_CARE_PROVIDER_SITE_OTHER): Payer: 59 | Admitting: Cardiology

## 2019-04-27 VITALS — Ht 76.0 in | Wt 328.0 lb

## 2019-04-27 DIAGNOSIS — E782 Mixed hyperlipidemia: Secondary | ICD-10-CM | POA: Diagnosis not present

## 2019-04-27 DIAGNOSIS — I2510 Atherosclerotic heart disease of native coronary artery without angina pectoris: Secondary | ICD-10-CM | POA: Diagnosis not present

## 2019-04-27 DIAGNOSIS — R0609 Other forms of dyspnea: Secondary | ICD-10-CM | POA: Diagnosis not present

## 2019-04-27 DIAGNOSIS — E785 Hyperlipidemia, unspecified: Secondary | ICD-10-CM | POA: Diagnosis not present

## 2019-04-27 MED ORDER — ROSUVASTATIN CALCIUM 20 MG PO TABS: 20.0000 mg | ORAL_TABLET | Freq: Every day | ORAL | 1 refills | Status: DC

## 2019-04-27 NOTE — Patient Instructions (Signed)
Your physician wants you to follow-up in: [place] will receive a reminder letter in the mail two months in advance. If you don't receive a letter, please call our office to schedule the follow-up appointment.  Your physician has recommended you make the following change in your medication:   STOP IMDUR  [place]  Thank you for choosing [facility]!!

## 2019-04-27 NOTE — Progress Notes (Signed)
Virtual Visit via Telephone Note   This visit type was conducted due to national recommendations for restrictions regarding the COVID-19 Pandemic (e.g. social distancing) in an effort to limit this patient's exposure and mitigate transmission in our community.  Due to his co-morbid illnesses, this patient is at least at moderate risk for complications without adequate follow up.  This format is felt to be most appropriate for this patient at this time.  The patient did not have access to video technology/had technical difficulties with video requiring transitioning to audio format only (telephone).  All issues noted in this document were discussed and addressed.  No physical exam could be performed with this format.  Please refer to the patient's chart for his  consent to telehealth for [facility].   Evaluation Performed:  Follow-up visit  Date:  04/27/2019   ID:  Daniel Crane, Daniel Crane 15-Jun-1961, MRN [id number]  Patient Location: Home Provider Location: Home  PCP:  Dettinger, [name], MD  Cardiologist:  Carlyle Dolly, MD  Electrophysiologist:  None   Chief Complaint:  1 year follow up  History of Present Illness:    Daniel Crane is a 58 y.o. male seen today for follow up of the following medical problems.   1. CAD -cath 2005 as reported below. 50% LAD/ostial D1. Nuclear stress at that time did not show ischemia, this was managed medically - significant SOB reported in 10/2017 -10/2017 echo: LVEF 60-65%, mild MR - 10/2017 nuclear stress: duke score of 6, apical inferior, basal inferoseptal, inferolateral reversible defects. Overall low to intermediate risk study.    - no recent chest pain. No SOB/DOE     2. Hyperlipidemia - ran out of crestor about 1 month ago    3. OSA  - abnormal sleep study 10/2017, followed by Dr Luan Pulling.  - could not afford CPAP machine    SH: works as Dealer   The patient does not have symptoms concerning for COVID-19 infection  (fever, chills, cough, or new shortness of breath).    Past Medical History:  Diagnosis Date  . Anginal pain ([place])    history of 11 years ago. No meds at present.  . Arthritis   . Collagen vascular disease ([place])   . Heart disease    catheterization in remote past, medically managed  . Hypercholesteremia    Past Surgical History:  Procedure Laterality Date  . APPENDECTOMY    . CARDIAC CATHETERIZATION    . CATARACT EXTRACTION W/PHACO Left 04/25/2016   Procedure: CATARACT EXTRACTION PHACO AND INTRAOCULAR LENS PLACEMENT (IOC);  Surgeon: Baruch Goldmann, MD;  Location: [facility] ORS;  Service: Ophthalmology;  Laterality: Left;  CDE 3.23  . CATARACT EXTRACTION W/PHACO Right 05/23/2016   Procedure: CATARACT EXTRACTION PHACO AND INTRAOCULAR LENS PLACEMENT RIGHT EYE CDE=1.11;  Surgeon: Baruch Goldmann, MD;  Location: [facility] ORS;  Service: Ophthalmology;  Laterality: Right;  . COLONOSCOPY N/A 10/09/2015   Procedure: COLONOSCOPY;  Surgeon: Daneil Dolin, MD;  Location: [facility] END[facility] SU[facility];  Service: Endoscopy;  Laterality: N/A;  1230 - pt can't come earlier - transportation issues  . dental implants       No outpatient medications have been marked as taking for the 04/27/19 encounter (Telemedicine) with Arnoldo Lenis, MD.     Allergies:   Patient has no known allergies.   Social History   Tobacco Use  . Smoking status: Former Smoker    Types: Pipe, Cigarettes    Start date: 12/15/1978  . Smokeless tobacco: Never Used  .  Tobacco comment: quit smoking cigarettes age 80 & started smoking a pipe when he was in his mid 60's  Substance Use Topics  . Alcohol use: Yes    Alcohol/week: 2.0 standard drinks    Types: 2 Cans of beer per week    Comment: 2-3 times per week   . Drug use: No     Family Hx: The patient's family history includes Alcoholism in his father; Heart attack in his paternal grandfather; Hypertension in his brother and father; Other in his father, mother, and sister; Stroke in his brother;  Thyroid disease in his sister. There is no history of Colon cancer.  ROS:   Please see the history of present illness.     All other systems reviewed and are negative.   Prior CV studies:   The following studies were reviewed today:  01/2004 cath  CORONARY ARTERIOGRAPHY: No calcification was seen on fluoroscopy.  1. Left main: Normal. 2. LAD: The LAD extended down to the apex of the heart and gave rise to one large diagonal Nolan Tuazon. At the bifurcation of the LAD and the diagonal was an area of approximately 50% narrowing that appeared to involve both the LAD and the ostium of the first diagonal. It was best seen in an LAO cranial view. Multiple angiograms were taken of this discrete area. Intracoronary nitroglycerin did not result in any change in the appearance of this. The remainder of the LAD and the diagonal were free of disease. 3. Right coronary artery: This was a large dominant vessel that was about 4 mm in diameter. There was a large PDA and 2 large posterolateral branches free of disease. 4. Circumflex: The circumflex in the A-V groove was small after OM #2. OM #1 was a large vessel and free of disease. OM #2 was small and free of disease.  CONCLUSION: 1. Normal left ventricular systolic function. 2. Fifty percent area of narrowing in the midportion of the left anterior descending that appears to involve both the left anterior descending and the ostium of the first diagonal.  DISCUSSION: My concern is whether or not the above-mentioned lesion is actually significant or not. It is in that gray zone and because of this, I have ordered a stress Cardiolite study. If it is clearly positive, then he will need percutaneous intervention, which would be relatively high risk because of the bifurcation involvement. I discussed this with the patient and the patient is scheduled for a Cardiolite  in the morning.  Because of his recurrent problems of back discomfort, he was given the combination of Versed and Nubain during the procedure with good relief of his back pain.   10/2017 echo Study Conclusions  - Left ventricle: The cavity size was normal. Wall thickness was increased in a pattern of mild LVH. Systolic function was normal. The estimated ejection fraction was in the range of 60% to 65%. Left ventricular diastolic function parameters were normal. - Aortic valve: Mildly calcified annulus. Normal thickness leaflets. Valve area (VTI): 4.12 cm^2. Valve area (Vmax): 3.73 cm^2. Valve area (Vmean): 3.72 cm^2. - Mitral valve: There was mild regurgitation. - Atrial septum: No defect or patent foramen ovale was identified. - Technically difficult study.  10/2017 nuclear stress  No diagnostic ST segment changes to indicate ischemia. No chest pain reported. Hypertensive response noted. Patient achieved 7.2 METS. Low risk Duke treadmill score of 6.  Blood pressure demonstrated a hypertensive response to exercise.  Medium, mild intensity, partially reversible apical inferior and basal inferoseptal as  well as inferolateral defects that suggest mild ischemic territories, although noted in the presence of diaphragmatic attenuation. TID ratio borderline increased at 1.23.  This is an intermediate risk study based on perfusion imaging.  Nuclear stress EF: 51%.  Labs/Other Tests and Data Reviewed:    EKG:  na  Recent Labs: No results found for requested labs within last 8760 hours.   Recent Lipid Panel Lab Results  Component Value Date/Time   CHOL 171 10/03/2017 09:21 AM   TRIG 188 (H) 10/03/2017 09:21 AM   HDL 42 10/03/2017 09:21 AM   CHOLHDL 4.1 10/03/2017 09:21 AM   CHOLHDL 4.8 10/08/2010 06:00 AM   LDLCALC 91 10/03/2017 09:21 AM    Wt Readings from Last 3 Encounters:  04/27/19 (!) 328 lb (148.8 kg)  06/16/18 (!) 328 lb 6.4 oz (149 kg)  02/05/18  (!) 324 lb 3.2 oz (147.1 kg)     Objective:    Vital Signs:  Ht 6\' 4"  (1.93 m)   Wt (!) 328 lb (148.8 kg)   BMI 39.93 kg/m    Normal affect. Normal speech pattern and tone. Comfortable, no distress. No audible signs of SOB or wheezing.   ASSESSMENT & PLAN:    1. CAD -no recent symptoms - we will conitnue medical therapy  2. Hyperlipidemia - refill his crestor. Labs followed by pcp. If not checked by next follow up we will order   COVID-19 Education: The signs and symptoms of COVID-19 were discussed with the patient and how to seek care for testing (follow up with PCP or arrange E-visit).  The importance of social distancing was discussed today.  Time:   Today, I have spent 18 minutes with the patient with telehealth technology discussing the above problems.     Medication Adjustments/Labs and Tests Ordered: Current medicines are reviewed at length with the patient today.  Concerns regarding medicines are outlined above.   Tests Ordered: No orders of the defined types were placed in this encounter.   Medication Changes: No orders of the defined types were placed in this encounter.   Disposition:  Follow up 6 months  Signed, Carlyle Dolly, MD  04/27/2019 3:14 PM    [place]

## 2019-07-25 IMAGING — NM NM MYOCAR MULTI W/SPECT W/WALL MOTION & EF
2 series · 12 of 12 positions shown · non-contrast
Comparison: none

[Series 1: rest · 6.51mm/px · 6 of 64 frames shown]
[frame 6/64]
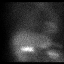
[frame 16/64]
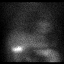
[frame 27/64]
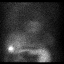
[frame 38/64]
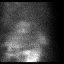
[frame 48/64]
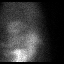
[frame 59/64]
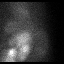

[Series 2: stress gated - perfusion · 6.51mm/px · 6 of 64 frames shown]
[frame 6/64]
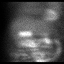
[frame 16/64]
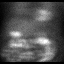
[frame 27/64]
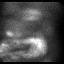
[frame 38/64]
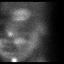
[frame 48/64]
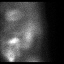
[frame 59/64]
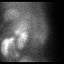

[12 of 12 positions shown; findings below may reference images not displayed]

Canned report from images found in remote index.

Refer to host system for actual result text.

## 2020-01-05 ENCOUNTER — Encounter: Payer: Self-pay | Admitting: Cardiology

## 2020-01-05 ENCOUNTER — Telehealth (INDEPENDENT_AMBULATORY_CARE_PROVIDER_SITE_OTHER): Payer: 59 | Admitting: Cardiology

## 2020-01-05 VITALS — Ht 76.0 in | Wt 325.0 lb

## 2020-01-05 DIAGNOSIS — E782 Mixed hyperlipidemia: Secondary | ICD-10-CM

## 2020-01-05 DIAGNOSIS — I2510 Atherosclerotic heart disease of native coronary artery without angina pectoris: Secondary | ICD-10-CM

## 2020-01-05 MED ORDER — ROSUVASTATIN CALCIUM 20 MG PO TABS: 20.0000 mg | ORAL_TABLET | Freq: Every day | ORAL | 3 refills | Status: DC

## 2020-01-05 NOTE — Patient Instructions (Signed)
Medication Instructions:   Begin Crestor 20mg  daily - new sent to pharmacy today.   Continue all other medications.    Labwork: none  Testing/Procedures: none  Follow-Up: Your physician wants you to follow up in: 6 months.  You will receive a reminder letter in the mail one-two months in advance.  If you don't receive a letter, please call our office to schedule the follow up appointment   Any Other Special Instructions Will Be Listed Below (If Applicable).  If you need a refill on your cardiac medications before your next appointment, please call your pharmacy.

## 2020-01-05 NOTE — Progress Notes (Signed)
Virtual Visit via Telephone Note   This visit type was conducted due to national recommendations for restrictions regarding the COVID-19 Pandemic (e.g. social distancing) in an effort to limit this patient's exposure and mitigate transmission in our community.  Due to his co-morbid illnesses, this patient is at least at moderate risk for complications without adequate follow up.  This format is felt to be most appropriate for this patient at this time.  The patient did not have access to video technology/had technical difficulties with video requiring transitioning to audio format only (telephone).  All issues noted in this document were discussed and addressed.  No physical exam could be performed with this format.  Please refer to the patient's chart for his  consent to telehealth for [facility].   Date:  01/05/2020   ID:  Daniel Crane, DOB March 21, 1961, MRN [id number]  Patient Location: Home Provider Location: Office  PCP:  Dettinger, [name], MD  Cardiologist:  Carlyle Dolly, MD  Electrophysiologist:  None   Evaluation Performed:  Follow-Up Visit  Chief Complaint:  Follow up visit  History of Present Illness:    Daniel Crane is a 59 y.o. male seen today for follow up of the following medical problems.   1. CAD -cath 2005 as reported below. 50% LAD/ostial D1. Nuclear stress at that time did not show ischemia, this was managed medically - significant SOB reported in 10/2017 -10/2017 echo: LVEF 60-65%, mild MR - 10/2017 nuclear stress: duke score of 6, apical inferior, basal inferoseptal, inferolateral reversible defects. Overall low to intermediate risk study.    - denies any chest pain. No SOB or DOE     2. Hyperlipidemia -has not restarted crestor as discussed at last visit. Denies any significant side effects when taking.     3. OSA - abnormal sleep study 10/2017, followed by Dr Luan Pulling. 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   The patient does not have symptoms concerning for COVID-19 infection (fever, chills, cough, or new shortness of breath).    Past Medical History:  Diagnosis Date  . Anginal pain ([place])    history of 11 years ago. No meds at present.  . Arthritis   . Collagen vascular disease ([place])   . Heart disease    catheterization in remote past, medically managed  . Hypercholesteremia    Past Surgical History:  Procedure Laterality Date  . APPENDECTOMY    . CARDIAC CATHETERIZATION    . CATARACT EXTRACTION W/PHACO Left 04/25/2016   Procedure: CATARACT EXTRACTION PHACO AND INTRAOCULAR LENS PLACEMENT (IOC);  Surgeon: Baruch Goldmann, MD;  Location: [facility];  Service: Ophthalmology;  Laterality: Left;  CDE 3.23  . CATARACT EXTRACTION W/PHACO Right 05/23/2016   Procedure: CATARACT EXTRACTION PHACO AND INTRAOCULAR LENS PLACEMENT RIGHT EYE CDE=1.11;  Surgeon: Baruch Goldmann, MD;  Location: [facility];  Service: Ophthalmology;  Laterality: Right;  . COLONOSCOPY N/A 10/09/2015   Procedure: COLONOSCOPY;  Surgeon: Daneil Dolin, MD;  Location: [facility];  Service: Endoscopy;  Laterality: N/A;  1230 - pt can't come earlier - transportation issues  . dental implants       No outpatient medications have been marked as taking for the 01/05/20 encounter (Appointment) with Arnoldo Lenis, MD.     Allergies:   Patient has no known allergies.   Social History   Tobacco Use  . Smoking status: Former Smoker  Types: Pipe, Cigarettes    Start date: 12/15/1978  . Smokeless tobacco: Never Used  . Tobacco comment: quit smoking cigarettes age 52 & started smoking a pipe when he was in his mid 57's  Substance Use Topics  . Alcohol use: Yes    Alcohol/week: 2.0 standard drinks    Types: 2 Cans of beer per week    Comment: 2-3 times per week   . Drug use: No     Family Hx: The patient's family history includes Alcoholism in his father;  Heart attack in his paternal grandfather; Hypertension in his brother and father; Other in his father, mother, and sister; Stroke in his brother; Thyroid disease in his sister. There is no history of Colon cancer.  ROS:   Please see the history of present illness.     All other systems reviewed and are negative.   Prior CV studies:   The following studies were reviewed today:  01/2004 cath  CORONARY ARTERIOGRAPHY: No calcification was seen on fluoroscopy.  1. Left main: Normal. 2. LAD: The LAD extended down to the apex of the heart and gave rise to one large diagonal Rodolfo Notaro. At the bifurcation of the LAD and the diagonal was an area of approximately 50% narrowing that appeared to involve both the LAD and the ostium of the first diagonal. It was best seen in an LAO cranial view. Multiple angiograms were taken of this discrete area. Intracoronary nitroglycerin did not result in any change in the appearance of this. The remainder of the LAD and the diagonal were free of disease. 3. Right coronary artery: This was a large dominant vessel that was about 4 mm in diameter. There was a large PDA and 2 large posterolateral branches free of disease. 4. Circumflex: The circumflex in the A-V groove was small after OM #2. OM #1 was a large vessel and free of disease. OM #2 was small and free of disease.  CONCLUSION: 1. Normal left ventricular systolic function. 2. Fifty percent area of narrowing in the midportion of the left anterior descending that appears to involve both the left anterior descending and the ostium of the first diagonal.  DISCUSSION: My concern is whether or not the above-mentioned lesion is actually significant or not. It is in that gray zone and because of this, I have ordered a stress Cardiolite study. If it is clearly positive, then he will need percutaneous intervention, which would be  relatively high risk because of the bifurcation involvement. I discussed this with the patient and the patient is scheduled for a Cardiolite in the morning.  Because of his recurrent problems of back discomfort, he was given the combination of Versed and Nubain during the procedure with good relief of his back pain.   10/2017 echo Study Conclusions  - Left ventricle: The cavity size was normal. Wall thickness was increased in a pattern of mild LVH. Systolic function was normal. The estimated ejection fraction was in the range of 60% to 65%. Left ventricular diastolic function parameters were normal. - Aortic valve: Mildly calcified annulus. Normal thickness leaflets. Valve area (VTI): 4.12 cm^2. Valve area (Vmax): 3.73 cm^2. Valve area (Vmean): 3.72 cm^2. - Mitral valve: There was mild regurgitation. - Atrial septum: No defect or patent foramen ovale was identified. - Technically difficult study.  10/2017 nuclear stress  No diagnostic ST segment changes to indicate ischemia. No chest pain reported. Hypertensive response noted. Patient achieved 7.2 METS. Low risk Duke treadmill score of 6.  Blood pressure demonstrated  a hypertensive response to exercise.  Medium, mild intensity, partially reversible apical inferior and basal inferoseptal as well as inferolateral defects that suggest mild ischemic territories, although noted in the presence of diaphragmatic attenuation. TID ratio borderline increased at 1.23.  This is an intermediate risk study based on perfusion imaging.  Nuclear stress EF: 51%.  Labs/Other Tests and Data Reviewed:    EKG:  No ECG reviewed.  Recent Labs: No results found for requested labs within last 8760 hours.   Recent Lipid Panel Lab Results  Component Value Date/Time   CHOL 171 10/03/2017 09:21 AM   TRIG 188 (H) 10/03/2017 09:21 AM   HDL 42 10/03/2017 09:21 AM   CHOLHDL 4.1 10/03/2017 09:21 AM   CHOLHDL 4.8 10/08/2010 06:00 AM    LDLCALC 91 10/03/2017 09:21 AM    Wt Readings from Last 3 Encounters:  04/27/19 (!) 328 lb (148.8 kg)  06/16/18 (!) 328 lb 6.4 oz (149 kg)  02/05/18 (!) 324 lb 3.2 oz (147.1 kg)     Objective:    Vital Signs:  There were no vitals taken for this visit.   Normal affect. Normal speech pattern and tone. Comfortable, no apparent distress. No audible signs of SOB or wheezing.   ASSESSMENT & PLAN:    1. CAD -no symptoms, continue current meds. Encouraged to restart his statin  2. Hyperlipidemia -encouraged compliance with his statin, will restart crestor 20mg  daily   Obtain annual labs next visit if not already done by pcp    COVID-19 Education: The signs and symptoms of COVID-19 were discussed with the patient and how to seek care for testing (follow up with PCP or arrange E-visit).  The importance of social distancing was discussed today.  Time:   Today, I have spent 14 minutes with the patient with telehealth technology discussing the above problems.     Medication Adjustments/Labs and Tests Ordered: Current medicines are reviewed at length with the patient today.  Concerns regarding medicines are outlined above.   Tests Ordered: No orders of the defined types were placed in this encounter.   Medication Changes: No orders of the defined types were placed in this encounter.   Follow Up:  Either In Person or Virtual in 6 month(s)  Signed, Carlyle Dolly, MD  01/05/2020 12:13 PM    [place]

## 2020-01-05 NOTE — Addendum Note (Signed)
Addended by: Laurine Blazer on: 01/05/2020 04:19 PM   Modules accepted: Orders

## 2020-07-25 ENCOUNTER — Ambulatory Visit (INDEPENDENT_AMBULATORY_CARE_PROVIDER_SITE_OTHER): Payer: 59 | Admitting: Family Medicine

## 2020-07-25 DIAGNOSIS — L03119 Cellulitis of unspecified part of limb: Secondary | ICD-10-CM

## 2020-07-25 NOTE — Progress Notes (Signed)
MyChart Video visit  Subjective: [name] bite PCP: Dettinger, [name], MD [name] Daniel Crane is a 59 y.o. male. Patient provides verbal consent for consult held via video.  Due to COVID-19 pandemic this visit was conducted virtually. This visit type was conducted due to national recommendations for restrictions regarding the COVID-19 Pandemic (e.g. social distancing, sheltering in place) in an effort to limit this patient's exposure and mitigate transmission in our community. All issues noted in this document were discussed and addressed.  A physical exam was not performed with this format.   Location of patient: home Location of provider: [facility] Others present for call: wife  1. Spider bite He woke up with some itching on the top of his right foot.  He put a topical on it and it burned.  The site where he excoriated himself has healed but now he has a lot of redness around the bite site. His wife had some old Bactroban cream and had been applying that but she is out now.  No fevers, purulence, drainage. Ambulating independently.  ROS: Per HPI  No Known Allergies Past Medical History:  Diagnosis Date   Anginal pain ([place])    history of 11 years ago. No meds at present.   Arthritis    Collagen vascular disease ([place])    Heart disease    catheterization in remote past, medically managed   Hypercholesteremia     Current Outpatient Medications:    aspirin EC 81 MG tablet, Take 81 mg by mouth daily., Disp: , Rfl:    Cyanocobalamin (VITAMIN B12 PO), Take 1 tablet by mouth daily., Disp: , Rfl:    diclofenac sodium (VOLTAREN) 1 % GEL, Apply 2 g topically 4 (four) times daily., Disp: 100 g, Rfl: 2   fluticasone (FLONASE) 50 MCG/ACT nasal spray, Place 1 spray into both nostrils 2 (two) times daily as needed for allergies or rhinitis., Disp: 16 g, Rfl: 6   ibuprofen (ADVIL,MOTRIN) 200 MG tablet, Take 400 mg by mouth every 6 (six) hours as needed for moderate pain., Disp: , Rfl:     MAGNESIUM PO, Take 2 tablets by mouth 2 (two) times daily., Disp: , Rfl:    Multiple Vitamins-Minerals (MULTIVITAMIN WITH MINERALS) tablet, Take 1 tablet by mouth daily., Disp: , Rfl:    Omega-3 Fatty Acids (FISH OIL PO), Take 1 tablet by mouth daily., Disp: , Rfl:    rosuvastatin (CRESTOR) 20 MG tablet, Take 1 tablet (20 mg total) by mouth daily., Disp: 90 tablet, Rfl: 3   Turmeric 400 MG CAPS, Take 400 mg by mouth 2 (two) times daily. , Disp: , Rfl:   Gen: well appearing male, NAD Foot: right dorsal foot with large area of erythema and induration. Central punctum noted. No drainage.   Assessment/ Plan: 59 y.o. male   1. Cellulitis of foot Cold compresses to reduce swelling. Home care instructions reviewed.  Return precautions discussed.  Follow up Friday if no improvement. - cephALEXin (KEFLEX) 500 MG capsule; Take 1 capsule (500 mg total) by mouth 4 (four) times daily for 7 days.  Dispense: 28 capsule; Refill: 0 - mupirocin ointment (BACTROBAN) 2 %; Apply 1 application topically 2 (two) times daily for 7 days.  Dispense: 22 g; Refill: 0 - hydrOXYzine (ATARAX/VISTARIL) 10 MG tablet; Take 1 tablet (10 mg total) by mouth 3 (three) times daily as needed for itching.  Dispense: 30 tablet; Refill: 0    Start time: 5:45pm End time: 5:52pm  Total time spent on patient care (including  video visit/ documentation): 22 minutes  [name], [place] [phone]

## 2020-07-25 NOTE — Patient Instructions (Signed)
Spider Bite Spider bites are not common. Most spider bites do not cause serious problems. There are only a few types of spider bites that can cause serious health problems. What are the causes? This condition is caused when you make contact with a spider in a way that traps the spider against your skin. What are the signs or symptoms? Some spider bites may cause symptoms within 1 hour after the bite. For other spider bites, it may take 1-2 days for symptoms to appear. Symptoms include:  A raised area that is red.  Redness and swelling around the area of the bite.  Pain in the area of the bite. A few types of spiders, such as the black widow spider or the brown recluse spider, can inject poison (venom) into a bite wound. This causes more serious symptoms. Symptoms of these bites vary, and may include:  Muscle cramps.  Feeling sick to your stomach (nauseous).  Throwing up (vomiting).  Pain in your belly (abdomen).  A fever.  A skin sore (lesion) that spreads. This can break into an open wound (skin ulcer).  Feeling light-headed or dizzy. How is this treated? Many spider bites do not need treatment. If needed, treatment may include:  Icing and keeping the bite area raised (elevated).  Taking or applying over-the-counter or prescription medicines to help with symptoms such as pain and itching.  Having a tetanus shot.  Taking antibiotic medicine. Follow these instructions at home: Medicines  Take or apply over-the-counter and prescription medicines only as told by your doctor.  If you were prescribed an antibiotic medicine, take it as told by your doctor. Do not stop using it even if you start to feel better. Managing pain and swelling   If told, put ice on the bite area. ? Put ice in a plastic bag. ? Place a towel between your skin and the bag. ? Leave the ice on for 20 minutes, 2-3 times a day.  Raise the bite area above the level of your heart while you are sitting or  lying down. General instructions   Do not scratch the bite area.  Keep the bite area clean and dry. Wash the bite area with soap and water each day as told by your doctor.  Keep all follow-up visits as told by your doctor. This is important. Contact a doctor if:  Your bite does not get better after 3 days.  Your bite turns black or purple.  Near the bite, you have more: ? Redness. ? Swelling. ? Pain. Get help right away if:  You get shortness of breath or chest pain.  You have fluid, blood, or pus coming from the bite area.  You have painful muscle cramps or sudden muscle tightening (spasms).  You have belly pain.  You feel sick to your stomach or you throw up.  You feel more tired or sleepy than normal. Summary  Spider bites are not common. When spider bites do happen, most do not cause serious health problems.  Take or apply all medicines only as told by your doctor.  Keep the bite area clean and dry. Wash the bite area with soap and water each day as told by your doctor.  Contact a doctor if you have more redness, swelling, or pain near the bite.  Get help right away if you get shortness of breath or chest pain. This information is not intended to replace advice given to you by your health care provider. Make sure you discuss any  questions you have with your health care provider. °Document Revised: 07/28/2018 Document Reviewed: 07/28/2018 °Elsevier Patient Education © 2020 [employer] ° °

## 2020-07-26 MED ORDER — CEPHALEXIN 500 MG PO CAPS: 500.0000 mg | ORAL_CAPSULE | Freq: Four times a day (QID) | ORAL | 0 refills | Status: AC

## 2020-07-26 MED ORDER — HYDROXYZINE HCL 10 MG PO TABS: 10.0000 mg | ORAL_TABLET | Freq: Three times a day (TID) | ORAL | 0 refills | Status: DC | PRN

## 2020-07-26 MED ORDER — MUPIROCIN 2 % EX OINT: 1.0000 "application " | TOPICAL_OINTMENT | Freq: Two times a day (BID) | CUTANEOUS | 0 refills | Status: AC

## 2020-08-24 ENCOUNTER — Telehealth: Payer: Self-pay

## 2020-08-24 NOTE — Telephone Encounter (Signed)
Spoke to pt's wife, he received recall letter for [facility] for nurse visit. She would like for nurse visit to be scheduled same day as her OV 10/30/20 at 11:00am if possible. [phone]

## 2020-08-24 NOTE — Telephone Encounter (Signed)
Spoke with pt and scheduled his nurse visit for 10/30/2020 at 11:00.

## 2020-10-02 ENCOUNTER — Ambulatory Visit (INDEPENDENT_AMBULATORY_CARE_PROVIDER_SITE_OTHER): Payer: 59 | Admitting: Family

## 2020-10-02 ENCOUNTER — Encounter: Payer: Self-pay | Admitting: Family

## 2020-10-02 DIAGNOSIS — Z20822 Contact with and (suspected) exposure to covid-19: Secondary | ICD-10-CM | POA: Diagnosis not present

## 2020-10-02 MED ORDER — DEXAMETHASONE 6 MG PO TABS: 6.0000 mg | ORAL_TABLET | Freq: Every day | ORAL | 0 refills | Status: DC

## 2020-10-02 NOTE — Addendum Note (Signed)
Addended by: Evelina Dun A on: 10/02/2020 01:28 PM   Modules accepted: Orders

## 2020-10-02 NOTE — Progress Notes (Addendum)
   Virtual Visit via telephone Note Due to COVID-19 pandemic this visit was conducted virtually. This visit type was conducted due to national recommendations for restrictions regarding the COVID-19 Pandemic (e.g. social distancing, sheltering in place) in an effort to limit this patient's exposure and mitigate transmission in our community. All issues noted in this document were discussed and addressed.  A physical exam was not performed with this format.  I connected with Daniel Crane on 10/02/20 at 1:05 pm  by telephone and verified that I am speaking with the correct person using two identifiers. Daniel Crane is currently located at home and wife is currently with him during visit. The provider, Evelina Dun, FNP is located in their office at time of visit.  I discussed the limitations, risks, security and privacy concerns of performing an evaluation and management service by telephone and the availability of in person appointments. I also discussed with the patient that there may be a patient responsible charge related to this service. The patient expressed understanding and agreed to proceed.   History and Present Illness:  Pt calls the office today with cough that started last week.  Cough This is a new problem. The current episode started in the past 7 days. Associated symptoms include chills, headaches, myalgias and sweats. Pertinent negatives include no ear congestion, ear pain, fever, nasal congestion, sore throat, shortness of breath or wheezing. Associated symptoms comments: diarrhea. Nothing aggravates the symptoms. The treatment provided mild relief.      Review of Systems  Constitutional: Positive for chills. Negative for fever.  HENT: Negative for ear pain and sore throat.   Respiratory: Positive for cough. Negative for shortness of breath and wheezing.   Musculoskeletal: Positive for myalgias.  Neurological: Positive for headaches.  All other systems reviewed and are  negative.    Observations/Objective: No SOB or distress noted  Assessment and Plan: Daniel Crane comes in today with chief complaint of No chief complaint on file.   Diagnosis and orders addressed:  1. Encounter by telehealth for suspected COVID-19 Rest Force fluids Continue OTC medications  Will come today and get COVID tested Quarantine until results return - dexamethasone (DECADRON) 6 MG tablet; Take 1 tablet (6 mg total) by mouth daily.  Dispense: 5 tablet; Refill: 0 - Novel Coronavirus, NAA ([facility])     I discussed the assessment and treatment plan with the patient. The patient was provided an opportunity to ask questions and all were answered. The patient agreed with the plan and demonstrated an understanding of the instructions.   The patient was advised to call back or seek an in-person evaluation if the symptoms worsen or if the condition fails to improve as anticipated.  The above assessment and management plan was discussed with the patient. The patient verbalized understanding of and has agreed to the management plan. Patient is aware to call the clinic if symptoms persist or worsen. Patient is aware when to return to the clinic for a follow-up visit. Patient educated on when it is appropriate to go to the emergency department.   Time call ended:  1:16 pm   I provided 11 minutes of non-face-to-face time during this encounter.    Evelina Dun, FNP

## 2020-10-03 LAB — SARS-COV-2, NAA 2 DAY TAT

## 2020-10-03 LAB — NOVEL CORONAVIRUS, NAA: SARS-CoV-2, NAA: DETECTED — AB

## 2020-10-04 ENCOUNTER — Telehealth: Payer: Self-pay | Admitting: Nurse Practitioner

## 2020-10-04 NOTE — Telephone Encounter (Signed)
I called Daniel Crane to discuss Covid symptoms and the use of casirivimab/imdevimab, a monoclonal antibody infusion for those with mild to moderate Covid symptoms and at a high risk of hospitalization.     Pt is qualified for this infusion at the monoclonal antibody infusion center due to co-morbid conditions and/or a member of an at-risk group, however declines infusion at this time. Symptoms tier reviewed as well as criteria for ending isolation.  Symptoms reviewed that would warrant ED/Hospital evaluation. Preventative practices reviewed. Patient verbalized understanding. Patient advised to call back if he decides that he does want to get infusion. Callback number to the infusion center given. Patient advised to go to Urgent care or ED with severe symptoms. Last date pt would be eligible for infusion is 10/7.     Murray Hodgkins, NP

## 2020-10-30 ENCOUNTER — Other Ambulatory Visit: Payer: Self-pay

## 2020-10-30 ENCOUNTER — Ambulatory Visit: Payer: Self-pay

## 2020-12-19 ENCOUNTER — Ambulatory Visit
Admission: RE | Admit: 2020-12-19 | Discharge: 2020-12-19 | Disposition: A | Discharge status: Home / Self Care | Payer: 59 | Source: Ambulatory Visit | Attending: Family Medicine | Admitting: Family Medicine

## 2020-12-19 ENCOUNTER — Other Ambulatory Visit: Payer: Self-pay

## 2020-12-19 VITALS — BP 147/84 | HR 82 | Temp 98.3°F | Resp 19 | Ht 76.0 in | Wt 298.0 lb

## 2020-12-19 DIAGNOSIS — L02212 Cutaneous abscess of back [any part, except buttock]: Secondary | ICD-10-CM | POA: Diagnosis not present

## 2020-12-19 MED ORDER — CEPHALEXIN 500 MG PO CAPS: 500.0000 mg | ORAL_CAPSULE | Freq: Two times a day (BID) | ORAL | 0 refills | Status: AC

## 2020-12-19 NOTE — Discharge Instructions (Signed)
Keep dry and covered for next 24-48 hours  You may then wash site daily with warm water and mild soap Keep covered to avoid friction  Take antibiotic as prescribed and to completion  Return sooner or go to the ED if you have any new or worsening symptoms such as increased redness, swelling, pain, nausea, vomiting, fever, chills, etc..Marland Kitchen

## 2020-12-19 NOTE — ED Provider Notes (Signed)
[facility]    CSN: [id number] Arrival date & time: 12/19/20  1651      History   Chief Complaint Chief Complaint  Patient presents with  . Abscess    HPI Daniel Crane is a 59 y.o. male.   Reports that he has had an abscess on his back for the last 2 weeks. Reports that his wife has been trying to open the area and drain it but that it is now just sore. Has had abscesses in the past. Denies drainage from the area, fever, red streaking from the area. Has not tried OTC medications for this.  ROS per HPI   Abscess   Past Medical History:  Diagnosis Date  . Anginal pain ([place])    history of 11 years ago. No meds at present.  . Arthritis   . Collagen vascular disease ([place])   . Heart disease    catheterization in remote past, medically managed  . Hypercholesteremia     Patient Active Problem List   Diagnosis Date Noted  . Hyperlipidemia LDL goal <130 04/01/2017  . Insomnia 11/29/2016  . History of colonic polyps   . Diverticulosis of colon without hemorrhage   . Encounter for screening colonoscopy 09/13/2015    Past Surgical History:  Procedure Laterality Date  . APPENDECTOMY    . CARDIAC CATHETERIZATION    . CATARACT EXTRACTION W/PHACO Left 04/25/2016   Procedure: CATARACT EXTRACTION PHACO AND INTRAOCULAR LENS PLACEMENT (IOC);  Surgeon: Baruch Goldmann, MD;  Location: [facility];  Service: Ophthalmology;  Laterality: Left;  CDE 3.23  . CATARACT EXTRACTION W/PHACO Right 05/23/2016   Procedure: CATARACT EXTRACTION PHACO AND INTRAOCULAR LENS PLACEMENT RIGHT EYE CDE=1.11;  Surgeon: Baruch Goldmann, MD;  Location: [facility];  Service: Ophthalmology;  Laterality: Right;  . COLONOSCOPY N/A 10/09/2015   Procedure: COLONOSCOPY;  Surgeon: Daneil Dolin, MD;  Location: [facility];  Service: Endoscopy;  Laterality: N/A;  1230 - pt can't come earlier - transportation issues  . dental implants         Home Medications    Prior to Admission medications   Medication  Sig Start Date End Date Taking? Authorizing Provider  aspirin EC 81 MG tablet Take 81 mg by mouth daily.    [provider]  cephALEXin (KEFLEX) 500 MG capsule Take 1 capsule (500 mg total) by mouth 2 (two) times daily for 7 days. 12/19/20 12/26/20  Faustino Congress, NP  Cyanocobalamin (VITAMIN B12 PO) Take 1 tablet by mouth daily.    [provider]  dexamethasone (DECADRON) 6 MG tablet Take 1 tablet (6 mg total) by mouth daily. 10/02/20   Evelina Dun A, FNP  diclofenac sodium (VOLTAREN) 1 % GEL Apply 2 g topically 4 (four) times daily. 10/03/17   Dettinger, [name], MD  fluticasone (FLONASE) 50 MCG/ACT nasal spray Place 1 spray into both nostrils 2 (two) times daily as needed for allergies or rhinitis. 11/29/16   Dettinger, [name], MD  hydrOXYzine (ATARAX/VISTARIL) 10 MG tablet Take 1 tablet (10 mg total) by mouth 3 (three) times daily as needed for itching. 07/26/20   Janora Norlander, DO  ibuprofen (ADVIL,MOTRIN) 200 MG tablet Take 400 mg by mouth every 6 (six) hours as needed for moderate pain.    [provider]  MAGNESIUM PO Take 2 tablets by mouth 2 (two) times daily.    [provider]  Multiple Vitamins-Minerals (MULTIVITAMIN WITH MINERALS) tablet Take 1 tablet by mouth daily.    [provider]  Omega-3 Fatty Acids (FISH OIL PO) Take 1 tablet by mouth daily.    [provider]  rosuvastatin (CRESTOR) 20 MG tablet Take 1 tablet (20 mg total) by mouth daily. 01/05/20 04/04/20  Arnoldo Lenis, MD  Turmeric 400 MG CAPS Take 400 mg by mouth 2 (two) times daily.     [provider]    Family History Family History  Problem Relation Age of Onset  . Other Mother        Chron's Dz  . Other Father        shot by wife, patient's step mother  . Alcoholism Father   . Hypertension Father   . Other Sister        h/o substance abuse  . Thyroid disease Sister   . Hypertension Brother   . Heart attack Paternal Grandfather    . Stroke Brother   . Colon cancer Neg Hx     Social History Social History   Tobacco Use  . Smoking status: Former Smoker    Types: Pipe, Cigarettes    Start date: 12/15/1978  . Smokeless tobacco: Never Used  . Tobacco comment: quit smoking cigarettes age 21 & started smoking a pipe when he was in his mid 6's  Vaping Use  . Vaping Use: Never used  Substance Use Topics  . Alcohol use: Yes    Alcohol/week: 2.0 standard drinks    Types: 2 Cans of beer per week    Comment: 2-3 times per week   . Drug use: No     Allergies   Patient has no known allergies.   Review of Systems Review of Systems   Physical Exam Triage Vital Signs ED Triage Vitals  Enc Vitals Group     BP 12/19/20 1719 (!) 147/84     Pulse Rate 12/19/20 1719 82     Resp 12/19/20 1719 19     Temp 12/19/20 1719 98.3 F (36.8 C)     Temp Source 12/19/20 1719 Oral     SpO2 12/19/20 1719 96 %     Weight 12/19/20 1717 298 lb (135.2 kg)     Height 12/19/20 1717 6\' 4"  (1.93 m)     Head Circumference --      Peak Flow --      Pain Score 12/19/20 1717 7     Pain Loc --      Pain Edu? --      Excl. in [place]? --    No data found.  Updated Vital Signs BP (!) 147/84 (BP Location: Right Arm)   Pulse 82   Temp 98.3 F (36.8 C) (Oral)   Resp 19   Ht 6\' 4"  (1.93 m)   Wt 298 lb (135.2 kg)   SpO2 96%   BMI 36.27 kg/m       Physical Exam Vitals and nursing note reviewed.  Constitutional:      General: He is not in acute distress.    Appearance: He is well-developed and well-nourished. He is obese. He is not ill-appearing.  HENT:     Head: Normocephalic and atraumatic.  Eyes:     Conjunctiva/sclera: Conjunctivae normal.  Cardiovascular:     Rate and Rhythm: Normal rate and regular rhythm.     Heart sounds: No murmur heard.   Pulmonary:     Effort: Pulmonary effort is normal. No respiratory distress.     Breath sounds: Normal breath sounds.  Abdominal:     Palpations: Abdomen  is soft.      Tenderness: There is no abdominal tenderness.  Musculoskeletal:        General: No edema. Normal range of motion.     Cervical back: Normal range of motion and neck supple.  Skin:    General: Skin is warm and dry.     Capillary Refill: Capillary refill takes less than 2 seconds.     Findings: Abscess present.       Neurological:     General: No focal deficit present.     Mental Status: He is alert and oriented to person, place, and time.  Psychiatric:        Mood and Affect: Mood and affect and mood normal.        Behavior: Behavior normal.        Thought Content: Thought content normal.      UC Treatments / Results  Labs (all labs ordered are listed, but only abnormal results are displayed) Labs Reviewed - No data to display  EKG   Radiology No results found.  Procedures Incision and Drainage  Date/Time: 12/19/2020 5:56 PM Performed by: Moshe CiproMatthews, Kitara Hebb, NP Authorized by: Moshe CiproMatthews, Latrease Kunde, NP   Consent:    Consent obtained:  Verbal   Consent given by:  Patient   Risks, benefits, and alternatives were discussed: yes     Risks discussed:  Bleeding, incomplete drainage and infection   Alternatives discussed:  Delayed treatment and alternative treatment Universal protocol:    Patient identity confirmed:  Verbally with patient Location:    Type:  Abscess   Size:  4x4 cm   Location:  Trunk   Trunk location:  Back Pre-procedure details:    Skin preparation:  Antiseptic wash Anesthesia:    Anesthesia method:  Local infiltration   Local anesthetic:  Lidocaine 2% w/o epi Procedure type:    Complexity:  Simple Procedure details:    Incision types:  Stab incision   Incision depth:  Subcutaneous   Wound management:  Probed and deloculated   Drainage:  Purulent and bloody   Drainage amount:  Moderate Post-procedure details:    Procedure completion:  Tolerated well, no immediate complications   (including critical care time)  Medications Ordered in  UC Medications - No data to display  Initial Impression / Assessment and Plan / UC Course  I have reviewed the triage vital signs and the nursing notes.  Pertinent labs & imaging results that were available during my care of the patient were reviewed by me and considered in my medical decision making (see chart for details).     Abscess  4x4 cm abscess to middle of the back as indicated in diagram x 2 weeks I & D in office, unable to drain completely, instructed to apply moist warm compresses at home Rx Keflex BID x 7 days Discussed that he may need dermatology referral Follow up with this office or with primary care if symptoms are persisting.  Follow up in the ER for high fever, trouble swallowing, trouble breathing, other concerning symptoms.  Final Clinical Impressions(s) / UC Diagnoses   Final diagnoses:  Cutaneous abscess of back excluding buttocks   Discharge Instructions   None    ED Prescriptions    Medication Sig Dispense Auth. Provider   cephALEXin (KEFLEX) 500 MG capsule Take 1 capsule (500 mg total) by mouth 2 (two) times daily for 7 days. 14 capsule Moshe CiproMatthews, Tali Coster, NP     PDMP not reviewed this encounter.   Moshe CiproMatthews, Marline Morace,  NP 12/19/20 1825

## 2020-12-19 NOTE — ED Triage Notes (Addendum)
Abscess to mid back x 2 weeks, has used boil ease and abx cream with no relief.

## 2021-01-05 ENCOUNTER — Encounter: Payer: Self-pay | Admitting: Family Medicine

## 2021-01-05 ENCOUNTER — Ambulatory Visit (INDEPENDENT_AMBULATORY_CARE_PROVIDER_SITE_OTHER): Payer: 59 | Admitting: Family Medicine

## 2021-01-05 DIAGNOSIS — G4700 Insomnia, unspecified: Secondary | ICD-10-CM | POA: Diagnosis not present

## 2021-01-05 DIAGNOSIS — Z20822 Contact with and (suspected) exposure to covid-19: Secondary | ICD-10-CM | POA: Diagnosis not present

## 2021-01-05 MED ORDER — TRAZODONE HCL 50 MG PO TABS
50.0000 mg | ORAL_TABLET | Freq: Every day | ORAL | 1 refills | Status: DC
Start: 2021-01-05 — End: 2021-06-27

## 2021-01-05 NOTE — Progress Notes (Signed)
    Subjective:    Patient ID: Daniel Crane, male    DOB: 28-Apr-1961, 60 y.o.   MRN: [id number]   HPI: Daniel Crane is a 60 y.o. male presenting for headache and runny nose, but nothing unusual. Nose spray helps with chronic rhino. Wife has CoVID currently, but he doesn't want to test since he has no symptoms.   Needs refill of trazodone due to poor sleep. 50 mg is sufficient.    Depression screen [facility] 2/9 10/03/2017 04/01/2017 01/18/2017 12/27/2016 11/29/2016  Decreased Interest 0 0 0 0 0  Down, Depressed, Hopeless 0 0 0 0 0  PHQ - 2 Score 0 0 0 0 0     Relevant past medical, surgical, family and social history reviewed and updated as indicated.  Interim medical history since our last visit reviewed. Allergies and medications reviewed and updated.  ROS:  Review of Systems   Social History   Tobacco Use  Smoking Status Former Smoker  . Types: Pipe, Cigarettes  . Start date: 12/15/1978  Smokeless Tobacco Never Used  Tobacco Comment   quit smoking cigarettes age 25 & started smoking a pipe when he was in his mid 66's       Objective:     Wt Readings from Last 3 Encounters:  12/19/20 298 lb (135.2 kg)  01/05/20 (!) 325 lb (147.4 kg)  04/27/19 (!) 328 lb (148.8 kg)     Exam deferred. Pt. Harboring due to COVID 19. Phone visit performed.   Assessment & Plan:   1. Close exposure to COVID-19 virus   2. Insomnia, unspecified type     Meds ordered this encounter  Medications  . traZODone (DESYREL) 50 MG tablet    Sig: Take 1 tablet (50 mg total) by mouth at bedtime.    Dispense:  90 tablet    Refill:  1    Please consider 90 day supplies to promote better adherence    No orders of the defined types were placed in this encounter.     Diagnoses and all orders for this visit:  Close exposure to COVID-19 virus  Insomnia, unspecified type -     traZODone (DESYREL) 50 MG tablet; Take 1 tablet (50 mg total) by mouth at bedtime.    Virtual Visit via telephone  Note  I discussed the limitations, risks, security and privacy concerns of performing an evaluation and management service by telephone and the availability of in person appointments. The patient was identified with two identifiers. Pt.expressed understanding and agreed to proceed. Pt. Is at home. Dr. Livia Snellen is in his office.  Follow Up Instructions:   I discussed the assessment and treatment plan with the patient. The patient was provided an opportunity to ask questions and all were answered. The patient agreed with the plan and demonstrated an understanding of the instructions.   The patient was advised to call back or seek an in-person evaluation if the symptoms worsen or if the condition fails to improve as anticipated.   Total minutes including chart review and phone contact time: 13   Follow up plan: Return if symptoms worsen or fail to improve.  Claretta Fraise, MD [place]

## 2021-02-05 ENCOUNTER — Other Ambulatory Visit: Payer: Self-pay | Admitting: Cardiology

## 2021-06-06 ENCOUNTER — Other Ambulatory Visit: Payer: Self-pay | Admitting: Family Medicine

## 2021-06-06 DIAGNOSIS — G4700 Insomnia, unspecified: Secondary | ICD-10-CM

## 2021-06-25 ENCOUNTER — Other Ambulatory Visit: Payer: Self-pay

## 2021-06-25 ENCOUNTER — Encounter: Payer: Self-pay | Admitting: Family Medicine

## 2021-06-25 ENCOUNTER — Ambulatory Visit (INDEPENDENT_AMBULATORY_CARE_PROVIDER_SITE_OTHER): Payer: 59 | Admitting: Family Medicine

## 2021-06-25 VITALS — BP 139/80 | HR 61 | Ht 76.0 in | Wt 313.0 lb

## 2021-06-25 DIAGNOSIS — M479 Spondylosis, unspecified: Secondary | ICD-10-CM | POA: Diagnosis not present

## 2021-06-25 DIAGNOSIS — M5136 Other intervertebral disc degeneration, lumbar region: Secondary | ICD-10-CM | POA: Diagnosis not present

## 2021-06-25 DIAGNOSIS — R609 Edema, unspecified: Secondary | ICD-10-CM | POA: Diagnosis not present

## 2021-06-25 MED ORDER — DICLOFENAC SODIUM 75 MG PO TBEC: 75.0000 mg | DELAYED_RELEASE_TABLET | Freq: Two times a day (BID) | ORAL | 3 refills | Status: DC

## 2021-06-25 MED ORDER — DICLOFENAC SODIUM 1 % EX GEL: 2.0000 g | Freq: Four times a day (QID) | CUTANEOUS | 3 refills | Status: DC

## 2021-06-25 NOTE — Progress Notes (Signed)
 BP 139/80   Pulse 61   Ht 6' 4" (1.93 m)   Wt (!) 313 lb (142 kg)   SpO2 99%   BMI 38.10 kg/m    Subjective:   Patient ID: Daniel Crane, male    DOB: 02/19/1961, 59 y.o.   MRN: [id number]  HPI: Daniel Crane is a 59 y.o. male presenting on 06/25/2021 for Leg Swelling (bilateral) and Back Pain (Radiates down leg.//Can tingle or goes to sleep)   HPI Patient is coming in complaining of increased issues with peripheral edema.  He has had some before but his wife says that that is been more often recently and is getting more swelling recently especially in some days at work.  He does go to you down at night usually but then it comes back every day.  She does want to double check that there is nothing else going on.  He denies any shortness of breath or fevers or chills.  He denies any difficulties with breathing when he lays flat.  Patient is also having his back pain come back, he says he has had this for quite some time and has had x-rays in the past showing degenerative disc disease and a little bit of scoliosis and he was doing good but over the past few years its been worsening and then over the past few months it has been significantly worsening.  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  He says it started to go down the back of both of his legs.  He said he did some inversion tables before and that really helped many years ago.  He has been trying some meloxicam from his wife and it helped some but not a ton.  He also does use Voltaren gel and it does help some as well.  Relevant past medical, surgical, family and social history reviewed and updated as indicated. Interim medical history since our last visit reviewed. Allergies and medications reviewed and updated.  Review of Systems  Constitutional:  Negative for chills and fever.  Respiratory:  Negative for cough, chest tightness, shortness of breath and wheezing.   Cardiovascular:  Positive for leg swelling. Negative for  chest pain and palpitations.  Musculoskeletal:  Positive for arthralgias and back pain. Negative for gait problem.  Skin:  Negative for rash.  All other systems reviewed and are negative.  Per HPI unless specifically indicated above   Allergies as of 06/25/2021   No Known Allergies      Medication List        Accurate as of June 25, 2021 11:08 AM. If you have any questions, ask your nurse or doctor.          STOP taking these medications    dexamethasone 6 MG tablet Commonly known as: DECADRON Stopped by: Joshua A Dettinger, MD   diclofenac sodium 1 % Gel Commonly known as: Voltaren Replaced by: diclofenac Sodium 1 % Gel Stopped by: Joshua A Dettinger, MD   hydrOXYzine 10 MG tablet Commonly known as: ATARAX/VISTARIL Stopped by: Joshua A Dettinger, MD       TAKE these medications    aspirin EC 81 MG tablet Take 81 mg by mouth daily.   diclofenac 75 MG EC tablet Commonly known as: VOLTAREN Take 1 tablet (75 mg total) by mouth 2 (two) times daily. Started by: Joshua A Dettinger, MD   diclofenac Sodium 1 % Gel Commonly known as: Voltaren Apply 2 g topically 4 (four)   times daily. Replaces: diclofenac sodium 1 % Gel Started by: Joshua A Dettinger, MD   FISH OIL PO Take 1 tablet by mouth daily.   fluticasone 50 MCG/ACT nasal spray Commonly known as: FLONASE Place 1 spray into both nostrils 2 (two) times daily as needed for allergies or rhinitis.   ibuprofen 200 MG tablet Commonly known as: ADVIL Take 400 mg by mouth every 6 (six) hours as needed for moderate pain.   MAGNESIUM PO Take 2 tablets by mouth 2 (two) times daily.   multivitamin with minerals tablet Take 1 tablet by mouth daily.   NAC 500 MG Caps Generic drug: Acetylcysteine Take by mouth.   rosuvastatin 20 MG tablet Commonly known as: CRESTOR Take 1 tablet by mouth once daily   traZODone 50 MG tablet Commonly known as: DESYREL Take 1 tablet (50 mg total) by mouth at bedtime.    Turmeric 400 MG Caps Take 400 mg by mouth 2 (two) times daily.   VITAMIN B12 PO Take 1 tablet by mouth daily.   vitamin C 500 MG tablet Commonly known as: ASCORBIC ACID Take 500 mg by mouth daily.   zinc gluconate 50 MG tablet Take 50 mg by mouth daily.         Objective:   BP 139/80   Pulse 61   Ht 6' 4" (1.93 m)   Wt (!) 313 lb (142 kg)   SpO2 99%   BMI 38.10 kg/m   Wt Readings from Last 3 Encounters:  06/25/21 (!) 313 lb (142 kg)  12/19/20 298 lb (135.2 kg)  01/05/20 (!) 325 lb (147.4 kg)    Physical Exam Vitals and nursing note reviewed.  Constitutional:      General: He is not in acute distress.    Appearance: He is well-developed. He is not diaphoretic.  Eyes:     General: No scleral icterus.    Conjunctiva/sclera: Conjunctivae normal.  Neck:     Thyroid: No thyromegaly.  Cardiovascular:     Rate and Rhythm: Normal rate and regular rhythm.     Heart sounds: Normal heart sounds. No murmur heard. Pulmonary:     Effort: Pulmonary effort is normal. No respiratory distress.     Breath sounds: Normal breath sounds. No wheezing, rhonchi or rales.  Musculoskeletal:        General: Swelling (1+ peripheral edema bilaterally) and tenderness (Bilateral lumbar tenderness, negative straight leg raise bilaterally) present. Normal range of motion.     Cervical back: Neck supple.  Lymphadenopathy:     Cervical: No cervical adenopathy.  Skin:    General: Skin is warm and dry.     Findings: No rash.  Neurological:     Mental Status: He is alert and oriented to person, place, and time.     Coordination: Coordination normal.  Psychiatric:        Behavior: Behavior normal.      Assessment & Plan:   Problem List Items Addressed This Visit   None Visit Diagnoses     Peripheral edema    -  Primary   Relevant Orders   CMP14+EGFR   Degenerative disc disease, lumbar       Relevant Medications   diclofenac Sodium (VOLTAREN) 1 % GEL   diclofenac (VOLTAREN) 75 MG  EC tablet   Arthritis of back       Relevant Medications   diclofenac Sodium (VOLTAREN) 1 % GEL   diclofenac (VOLTAREN) 75 MG EC tablet       Leg   swelling seems to be peripheral edema, will check kidneys and liver on the way out just to be sure nothing is changed there.  Lungs and heart sounds good.  Will try Voltaren gel and Voltaren pills. Follow up plan: Return if symptoms worsen or fail to improve.  Counseling provided for all of the vaccine components Orders Placed This Encounter  Procedures   CMP14+EGFR    Joshua Dettinger, MD [facility] 06/25/2021, 11:08 AM     

## 2021-06-26 ENCOUNTER — Other Ambulatory Visit: Payer: Self-pay | Admitting: Family Medicine

## 2021-06-26 DIAGNOSIS — G4700 Insomnia, unspecified: Secondary | ICD-10-CM

## 2021-06-26 LAB — CMP14+EGFR
ALT: 42 IU/L (ref 0–44)
AST: 32 IU/L (ref 0–40)
Albumin/Globulin Ratio: 1.6 (ref 1.2–2.2)
Albumin: 4.5 g/dL (ref 3.8–4.9)
Alkaline Phosphatase: 82 IU/L (ref 44–121)
BUN/Creatinine Ratio: 13 (ref 9–20)
BUN: 12 mg/dL (ref 6–24)
Bilirubin Total: 0.4 mg/dL (ref 0.0–1.2)
CO2: 22 mmol/L (ref 20–29)
Calcium: 9.2 mg/dL (ref 8.7–10.2)
Chloride: 103 mmol/L (ref 96–106)
Creatinine, Ser: 0.91 mg/dL (ref 0.76–1.27)
Globulin, Total: 2.8 g/dL (ref 1.5–4.5)
Glucose: 101 mg/dL — ABNORMAL HIGH (ref 65–99)
Potassium: 4.7 mmol/L (ref 3.5–5.2)
Sodium: 139 mmol/L (ref 134–144)
Total Protein: 7.3 g/dL (ref 6.0–8.5)
eGFR: 97 mL/min/{1.73_m2} (ref >59)

## 2021-07-05 ENCOUNTER — Other Ambulatory Visit: Payer: Self-pay | Admitting: Family Medicine

## 2021-07-05 DIAGNOSIS — G4700 Insomnia, unspecified: Secondary | ICD-10-CM

## 2021-07-08 ENCOUNTER — Other Ambulatory Visit: Payer: Self-pay | Admitting: Cardiology

## 2021-07-08 ENCOUNTER — Other Ambulatory Visit: Payer: Self-pay | Admitting: Family Medicine

## 2021-07-08 DIAGNOSIS — G4700 Insomnia, unspecified: Secondary | ICD-10-CM

## 2021-07-09 ENCOUNTER — Other Ambulatory Visit: Payer: Self-pay | Admitting: Family Medicine

## 2021-07-09 DIAGNOSIS — G4700 Insomnia, unspecified: Secondary | ICD-10-CM

## 2021-08-14 ENCOUNTER — Encounter: Payer: 59 | Admitting: Family Medicine

## 2021-08-15 ENCOUNTER — Encounter: Payer: Self-pay | Admitting: Family Medicine

## 2021-09-24 ENCOUNTER — Encounter: Payer: Self-pay | Admitting: Family Medicine

## 2021-09-24 ENCOUNTER — Other Ambulatory Visit: Payer: Self-pay

## 2021-09-24 ENCOUNTER — Ambulatory Visit (INDEPENDENT_AMBULATORY_CARE_PROVIDER_SITE_OTHER): Payer: 59 | Admitting: Family Medicine

## 2021-09-24 VITALS — BP 158/87 | HR 76 | Ht 76.0 in | Wt 313.0 lb

## 2021-09-24 DIAGNOSIS — E785 Hyperlipidemia, unspecified: Secondary | ICD-10-CM

## 2021-09-24 DIAGNOSIS — G4700 Insomnia, unspecified: Secondary | ICD-10-CM | POA: Diagnosis not present

## 2021-09-24 DIAGNOSIS — Z125 Encounter for screening for malignant neoplasm of prostate: Secondary | ICD-10-CM

## 2021-09-24 DIAGNOSIS — K429 Umbilical hernia without obstruction or gangrene: Secondary | ICD-10-CM

## 2021-09-24 DIAGNOSIS — Z0000 Encounter for general adult medical examination without abnormal findings: Secondary | ICD-10-CM

## 2021-09-24 DIAGNOSIS — Z0001 Encounter for general adult medical examination with abnormal findings: Secondary | ICD-10-CM

## 2021-09-24 DIAGNOSIS — I10 Essential (primary) hypertension: Secondary | ICD-10-CM

## 2021-09-24 MED ORDER — AMLODIPINE BESYLATE 5 MG PO TABS
5.0000 mg | ORAL_TABLET | Freq: Every day | ORAL | 3 refills | Status: DC
Start: 2021-09-24 — End: 2022-07-12

## 2021-09-24 MED ORDER — TRAZODONE HCL 50 MG PO TABS
50.0000 mg | ORAL_TABLET | Freq: Every day | ORAL | 3 refills | Status: DC
Start: 2021-09-24 — End: 2022-07-12

## 2021-09-24 NOTE — Progress Notes (Signed)
BP (!) 158/87   Pulse 76   Ht 6' 4" (1.93 m)   Wt (!) 313 lb (142 kg)   SpO2 98%   BMI 38.10 kg/m    Subjective:   Patient ID: Daniel Crane, male    DOB: [birth year], 60 y.o.   MRN: [id number]  HPI: Daniel Crane is a 60 y.o. male presenting on 09/24/2021 for Medical Management of Chronic Issues (CPE)   HPI Adult well exam and physical Patient denies any chest pain, shortness of breath, headaches or vision issues, abdominal complaints, diarrhea, nausea, vomiting, or joint issues.  Denies any major health issues.  He says he is feeling pretty good.  He does have a little umbilical hernia that sore sometimes but has no sharp pains.  He denies any constipation or blood in his stool with it.  Relevant past medical, surgical, family and social history reviewed and updated as indicated. Interim medical history since our last visit reviewed. Allergies and medications reviewed and updated.  Review of Systems  Constitutional:  Negative for chills and fever.  HENT:  Negative for ear pain and tinnitus.   Eyes:  Negative for pain and discharge.  Respiratory:  Negative for cough, shortness of breath and wheezing.   Cardiovascular:  Negative for chest pain, palpitations and leg swelling.  Gastrointestinal:  Negative for abdominal distention, abdominal pain, blood in stool, constipation and diarrhea.  Genitourinary:  Negative for dysuria and hematuria.  Musculoskeletal:  Negative for back pain, gait problem and myalgias.  Skin:  Negative for rash.  Neurological:  Negative for dizziness, weakness and headaches.  Psychiatric/Behavioral:  Negative for suicidal ideas.   All other systems reviewed and are negative.  Per HPI unless specifically indicated above   Allergies as of 09/24/2021   No Known Allergies      Medication List        Accurate as of September 24, 2021  2:52 PM. If you have any questions, ask your nurse or doctor.          amLODipine 5 MG tablet Commonly known  as: NORVASC Take 1 tablet (5 mg total) by mouth daily. Started by: Worthy Rancher, MD   aspirin EC 81 MG tablet Take 81 mg by mouth daily.   diclofenac 75 MG EC tablet Commonly known as: VOLTAREN Take 1 tablet (75 mg total) by mouth 2 (two) times daily.   diclofenac Sodium 1 % Gel Commonly known as: Voltaren Apply 2 g topically 4 (four) times daily.   FISH OIL PO Take 1 tablet by mouth daily.   fluticasone 50 MCG/ACT nasal spray Commonly known as: FLONASE Place 1 spray into both nostrils 2 (two) times daily as needed for allergies or rhinitis.   ibuprofen 200 MG tablet Commonly known as: ADVIL Take 400 mg by mouth every 6 (six) hours as needed for moderate pain.   MAGNESIUM PO Take 2 tablets by mouth 2 (two) times daily.   multivitamin with minerals tablet Take 1 tablet by mouth daily.   NAC 500 MG Caps Generic drug: Acetylcysteine Take by mouth.   rosuvastatin 20 MG tablet Commonly known as: CRESTOR Take 1 tablet by mouth once daily   traZODone 50 MG tablet Commonly known as: DESYREL Take 1 tablet (50 mg total) by mouth at bedtime.   Turmeric 400 MG Caps Take 400 mg by mouth 2 (two) times daily.   VITAMIN B12 PO Take 1 tablet by mouth daily.   vitamin C 500 MG tablet Commonly  known as: ASCORBIC ACID Take 500 mg by mouth daily.   zinc gluconate 50 MG tablet Take 50 mg by mouth daily.         Objective:   BP (!) 158/87   Pulse 76   Ht 6' 4" (1.93 m)   Wt (!) 313 lb (142 kg)   SpO2 98%   BMI 38.10 kg/m   Wt Readings from Last 3 Encounters:  09/24/21 (!) 313 lb (142 kg)  06/25/21 (!) 313 lb (142 kg)  12/19/20 298 lb (135.2 kg)    Physical Exam Vitals and nursing note reviewed.  Constitutional:      General: He is not in acute distress.    Appearance: He is well-developed. He is not diaphoretic.  HENT:     Right Ear: External ear normal.     Left Ear: External ear normal.     Nose: Nose normal.     Mouth/Throat:     Pharynx: No  oropharyngeal exudate.  Eyes:     General: No scleral icterus.    Conjunctiva/sclera: Conjunctivae normal.  Neck:     Thyroid: No thyromegaly.  Cardiovascular:     Rate and Rhythm: Normal rate and regular rhythm.     Heart sounds: Normal heart sounds. No murmur heard. Pulmonary:     Effort: Pulmonary effort is normal. No respiratory distress.     Breath sounds: Normal breath sounds. No wheezing.  Abdominal:     General: Bowel sounds are normal. There is no distension.     Palpations: Abdomen is soft.     Tenderness: There is no abdominal tenderness. There is no guarding or rebound.     Hernia: A hernia is present. Hernia is present in the umbilical area (Half centimeter reducible hernia, nontender).  Genitourinary:    Prostate: Normal.     Rectum: Normal.  Musculoskeletal:        General: Normal range of motion.     Cervical back: Neck supple.  Lymphadenopathy:     Cervical: No cervical adenopathy.  Skin:    General: Skin is warm and dry.     Findings: No rash.  Neurological:     Mental Status: He is alert and oriented to person, place, and time.     Coordination: Coordination normal.  Psychiatric:        Behavior: Behavior normal.      Assessment & Plan:   Problem List Items Addressed This Visit       Other   Insomnia   Relevant Medications   traZODone (DESYREL) 50 MG tablet   Hyperlipidemia LDL goal <130   Relevant Medications   amLODipine (NORVASC) 5 MG tablet   Other Relevant Orders   Lipid panel   Other Visit Diagnoses     Physical exam    -  Primary   Relevant Orders   CBC with Differential/Platelet   CMP14+EGFR   Umbilical hernia without obstruction and without gangrene       Hypertension, essential       Relevant Medications   amLODipine (NORVASC) 5 MG tablet   Prostate cancer screening       Relevant Orders   PSA, total and free       Will start amlodipine for his blood pressure, will check blood work.  Patient wants to hold off on the  umbilical hernia referral at this point but will let us know in the future.   Follow up plan: Return in about 2 months (around 11/24/2021), or   if symptoms worsen or fail to improve, for Blood pressure recheck.  Counseling provided for all of the vaccine components Orders Placed This Encounter  Procedures   CBC with Differential/Platelet   CMP14+EGFR   Lipid panel   PSA, total and free    Caryl Pina, MD [facility] 09/24/2021, 2:52 PM

## 2021-09-25 LAB — CBC WITH DIFFERENTIAL/PLATELET
Basophils Absolute: 0.1 10*3/uL (ref 0.0–0.2)
Basos: 1 %
EOS (ABSOLUTE): 0.3 10*3/uL (ref 0.0–0.4)
Eos: 4 %
Hematocrit: 44.7 % (ref 37.5–51.0)
Hemoglobin: 15.1 g/dL (ref 13.0–17.7)
Immature Grans (Abs): 0 10*3/uL (ref 0.0–0.1)
Immature Granulocytes: 0 %
Lymphocytes Absolute: 2.3 10*3/uL (ref 0.7–3.1)
Lymphs: 27 %
MCH: 31.6 pg (ref 26.6–33.0)
MCHC: 33.8 g/dL (ref 31.5–35.7)
MCV: 94 fL (ref 79–97)
Monocytes Absolute: 0.7 10*3/uL (ref 0.1–0.9)
Monocytes: 9 %
Neutrophils Absolute: 5.1 10*3/uL (ref 1.4–7.0)
Neutrophils: 59 %
Platelets: 257 10*3/uL (ref 150–450)
RBC: 4.78 x10E6/uL (ref 4.14–5.80)
RDW: 13.1 % (ref 11.6–15.4)
WBC: 8.5 10*3/uL (ref 3.4–10.8)

## 2021-09-25 LAB — CMP14+EGFR
ALT: 60 IU/L — ABNORMAL HIGH (ref 0–44)
AST: 41 IU/L — ABNORMAL HIGH (ref 0–40)
Albumin/Globulin Ratio: 1.8 (ref 1.2–2.2)
Albumin: 4.5 g/dL (ref 3.8–4.9)
Alkaline Phosphatase: 85 IU/L (ref 44–121)
BUN/Creatinine Ratio: 20 (ref 9–20)
BUN: 17 mg/dL (ref 6–24)
Bilirubin Total: 0.5 mg/dL (ref 0.0–1.2)
CO2: 21 mmol/L (ref 20–29)
Calcium: 9.2 mg/dL (ref 8.7–10.2)
Chloride: 105 mmol/L (ref 96–106)
Creatinine, Ser: 0.83 mg/dL (ref 0.76–1.27)
Globulin, Total: 2.5 g/dL (ref 1.5–4.5)
Glucose: 87 mg/dL (ref 70–99)
Potassium: 4.1 mmol/L (ref 3.5–5.2)
Sodium: 140 mmol/L (ref 134–144)
Total Protein: 7 g/dL (ref 6.0–8.5)
eGFR: 101 mL/min/{1.73_m2} (ref >59)

## 2021-09-25 LAB — LIPID PANEL
Chol/HDL Ratio: 3.8 ratio (ref 0.0–5.0)
Cholesterol, Total: 164 mg/dL (ref 100–199)
HDL: 43 mg/dL (ref >39)
LDL Chol Calc (NIH): 93 mg/dL (ref 0–99)
Triglycerides: 159 mg/dL — ABNORMAL HIGH (ref 0–149)
VLDL Cholesterol Cal: 28 mg/dL (ref 5–40)

## 2021-09-25 LAB — PSA, TOTAL AND FREE
PSA, Free Pct: 30 %
PSA, Free: 0.42 ng/mL
Prostate Specific Ag, Serum: 1.4 ng/mL (ref 0.0–4.0)

## 2021-10-30 ENCOUNTER — Ambulatory Visit (INDEPENDENT_AMBULATORY_CARE_PROVIDER_SITE_OTHER): Payer: 59

## 2021-10-30 ENCOUNTER — Other Ambulatory Visit: Payer: Self-pay | Admitting: Family Medicine

## 2021-10-30 ENCOUNTER — Ambulatory Visit (INDEPENDENT_AMBULATORY_CARE_PROVIDER_SITE_OTHER): Payer: 59 | Admitting: Family

## 2021-10-30 ENCOUNTER — Encounter: Payer: Self-pay | Admitting: Family

## 2021-10-30 ENCOUNTER — Other Ambulatory Visit: Payer: Self-pay

## 2021-10-30 VITALS — BP 154/81 | HR 71 | Temp 98.0°F | Ht 76.0 in | Wt 315.2 lb

## 2021-10-30 DIAGNOSIS — M79671 Pain in right foot: Secondary | ICD-10-CM

## 2021-10-30 DIAGNOSIS — M51369 Other intervertebral disc degeneration, lumbar region without mention of lumbar back pain or lower extremity pain: Secondary | ICD-10-CM

## 2021-10-30 DIAGNOSIS — M479 Spondylosis, unspecified: Secondary | ICD-10-CM

## 2021-10-30 DIAGNOSIS — M5136 Other intervertebral disc degeneration, lumbar region: Secondary | ICD-10-CM

## 2021-10-30 MED ORDER — PREDNISONE 10 MG (21) PO TBPK: ORAL_TABLET | ORAL | 0 refills | Status: DC

## 2021-10-30 NOTE — Patient Instructions (Signed)
Foot Pain °Many things can cause foot pain. Some common causes are: °An injury. °A sprain. °Arthritis. °Blisters. °Bunions. °Follow these instructions at home: °Managing pain, stiffness, and swelling °If directed, put ice on the painful area: °Put ice in a plastic bag. °Place a towel between your skin and the bag. °Leave the ice on for 20 minutes, 2-3 times a day. ° °Activity °Do not stand or walk for long periods. °Return to your normal activities as told by your health care provider. Ask your health care provider what activities are safe for you. °Do stretches to relieve foot pain and stiffness as told by your health care provider. °Do not lift anything that is heavier than 10 lb (4.5 kg), or the limit that you are told, until your health care provider says that it is safe. Lifting a lot of weight can put added pressure on your feet. °Lifestyle °Wear comfortable, supportive shoes that fit you well. Do not wear high heels. °Keep your feet clean and dry. °General instructions °Take over-the-counter and prescription medicines only as told by your health care provider. °Rub your foot gently. °Pay attention to any changes in your symptoms. °Keep all follow-up visits as told by your health care provider. This is important. °Contact a health care provider if: °Your pain does not get better after a few days of self-care. °Your pain gets worse. °You cannot stand on your foot. °Get help right away if: °Your foot is numb or tingling. °Your foot or toes are swollen. °Your foot or toes turn white or blue. °You have warmth and redness along your foot. °Summary °Common causes of foot pain are injury, sprain, arthritis, blisters, or bunions. °Ice, medicines, and comfortable shoes may help foot pain. °Contact your health care provider if your pain does not get better after a few days of self-care. °This information is not intended to replace advice given to you by your health care provider. Make sure you discuss any questions you  have with your health care provider. °Document Revised: 03/21/2021 Document Reviewed: 03/21/2021 °Elsevier Patient Education © 2022 [employer] ° °

## 2021-10-30 NOTE — Progress Notes (Signed)
   Subjective:    Patient ID: Daniel Crane, male    DOB: 1961/06/04, 60 y.o.   MRN: [id number]  Chief Complaint  Patient presents with   Foot Pain    Right foot woke up Sunday. No injury he is aware    PT presents to the office today with right foot pain. He reports he woke up Sunday with pain, swelling, and bruising. Denies any injury.  Foot Pain This is a new problem. The current episode started in the past 7 days. The problem occurs constantly. The problem has been waxing and waning. The symptoms are aggravated by standing. He has tried NSAIDs and acetaminophen for the symptoms.     Review of Systems  All other systems reviewed and are negative.     Objective:   Physical Exam Vitals reviewed.  Constitutional:      General: He is not in acute distress.    Appearance: He is well-developed. He is obese.  HENT:     Head: Normocephalic.     Right Ear: Tympanic membrane normal.     Left Ear: Tympanic membrane normal.  Eyes:     General:        Right eye: No discharge.        Left eye: No discharge.     Pupils: Pupils are equal, round, and reactive to light.  Neck:     Thyroid: No thyromegaly.  Cardiovascular:     Rate and Rhythm: Normal rate and regular rhythm.     Heart sounds: Normal heart sounds. No murmur heard. Pulmonary:     Effort: Pulmonary effort is normal. No respiratory distress.     Breath sounds: Normal breath sounds. No wheezing.  Abdominal:     General: Bowel sounds are normal. There is no distension.     Palpations: Abdomen is soft.     Tenderness: There is no abdominal tenderness.  Musculoskeletal:        General: Tenderness present. Normal range of motion.     Cervical back: Normal range of motion and neck supple.       Feet:  Skin:    General: Skin is warm and dry.     Findings: No erythema or rash.  Neurological:     Mental Status: He is alert and oriented to person, place, and time.     Cranial Nerves: No cranial nerve deficit.     Deep  Tendon Reflexes: Reflexes are normal and symmetric.  Psychiatric:        Behavior: Behavior normal.        Thought Content: Thought content normal.        Judgment: Judgment normal.      BP (!) 154/81   Pulse 71   Temp 98 F (36.7 C) (Temporal)   Ht 6\' 4"  (1.93 m)   Wt (!) 315 lb 3.2 oz (143 kg)   BMI 38.37 kg/m      Assessment & Plan:  Daniel Crane comes in today with chief complaint of Foot Pain (Right foot woke up Sunday. No injury he is aware )   Diagnosis and orders addressed:  1. Right foot pain Rest Ice  Continue Diclofenac BID  X-ray negative  - DG Foot Complete Right - predniSONE (STERAPRED UNI-PAK 21 TAB) 10 MG (21) TBPK tablet; Use as directed  Dispense: 21 tablet; Refill: 0      Daniel Dun, FNP

## 2021-11-26 ENCOUNTER — Ambulatory Visit (INDEPENDENT_AMBULATORY_CARE_PROVIDER_SITE_OTHER): Payer: 59 | Admitting: Family Medicine

## 2021-11-26 ENCOUNTER — Encounter: Payer: Self-pay | Admitting: Family Medicine

## 2021-11-26 DIAGNOSIS — I10 Essential (primary) hypertension: Secondary | ICD-10-CM | POA: Diagnosis not present

## 2021-11-26 MED ORDER — ROSUVASTATIN CALCIUM 20 MG PO TABS: 20.0000 mg | ORAL_TABLET | Freq: Every day | ORAL | 3 refills | Status: DC

## 2021-11-26 NOTE — Progress Notes (Signed)
BP (!) 147/85   Pulse (!) 143   Ht 6\' 4"  (1.93 m)   Wt (!) 322 lb (146.1 kg)   SpO2 96%   BMI 39.20 kg/m    Subjective:   Patient ID: Daniel Crane, male    DOB: 1961-02-24, 60 y.o.   MRN: [id number]  HPI: Daniel Crane is a 60 y.o. male presenting on 11/26/2021 for Medical Management of Chronic Issues and Hypertension   HPI Hypertension Patient is currently on amlodipine, and their blood pressure today is 147/85, 137/61. Patient denies any lightheadedness or dizziness. Patient denies headaches, blurred vision, chest pains, shortness of breath, or weakness. Denies any side effects from medication and is content with current medication.   Relevant past medical, surgical, family and social history reviewed and updated as indicated. Interim medical history since our last visit reviewed. Allergies and medications reviewed and updated.  Review of Systems  Constitutional:  Negative for chills and fever.  Eyes:  Negative for visual disturbance.  Respiratory:  Negative for shortness of breath and wheezing.   Cardiovascular:  Negative for chest pain and leg swelling.  Musculoskeletal:  Negative for back pain and gait problem.  Skin:  Negative for rash.  Neurological:  Negative for dizziness, weakness and light-headedness.  All other systems reviewed and are negative.  Per HPI unless specifically indicated above   Allergies as of 11/26/2021   No Known Allergies      Medication List        Accurate as of November 26, 2021  4:10 PM. If you have any questions, ask your nurse or doctor.          STOP taking these medications    predniSONE 10 MG (21) Tbpk tablet Commonly known as: STERAPRED UNI-PAK 21 TAB Stopped by: [name] Jeronica Stlouis, MD       TAKE these medications    amLODipine 5 MG tablet Commonly known as: NORVASC Take 1 tablet (5 mg total) by mouth daily.   aspirin EC 81 MG tablet Take 81 mg by mouth daily.   diclofenac 75 MG EC tablet Commonly known  as: VOLTAREN Take 1 tablet by mouth twice daily   diclofenac Sodium 1 % Gel Commonly known as: Voltaren Apply 2 g topically 4 (four) times daily.   FISH OIL PO Take 1 tablet by mouth daily.   fluticasone 50 MCG/ACT nasal spray Commonly known as: FLONASE Place 1 spray into both nostrils 2 (two) times daily as needed for allergies or rhinitis.   ibuprofen 200 MG tablet Commonly known as: ADVIL Take 400 mg by mouth every 6 (six) hours as needed for moderate pain.   MAGNESIUM PO Take 2 tablets by mouth 2 (two) times daily.   multivitamin with minerals tablet Take 1 tablet by mouth daily.   NAC 500 MG Caps Generic drug: Acetylcysteine Take by mouth.   rosuvastatin 20 MG tablet Commonly known as: CRESTOR Take 1 tablet (20 mg total) by mouth daily.   traZODone 50 MG tablet Commonly known as: DESYREL Take 1 tablet (50 mg total) by mouth at bedtime.   Turmeric 400 MG Caps Take 400 mg by mouth 2 (two) times daily.   VITAMIN B12 PO Take 1 tablet by mouth daily.   vitamin C 500 MG tablet Commonly known as: ASCORBIC ACID Take 500 mg by mouth daily.   zinc gluconate 50 MG tablet Take 50 mg by mouth daily.         Objective:   BP Marland Kitchen)  147/85   Pulse (!) 143   Ht 6\' 4"  (1.93 m)   Wt (!) 322 lb (146.1 kg)   SpO2 96%   BMI 39.20 kg/m   Wt Readings from Last 3 Encounters:  11/26/21 (!) 322 lb (146.1 kg)  10/30/21 (!) 315 lb 3.2 oz (143 kg)  09/24/21 (!) 313 lb (142 kg)    Physical Exam Vitals and nursing note reviewed.  Constitutional:      General: He is not in acute distress.    Appearance: He is well-developed. He is not diaphoretic.  Eyes:     General: No scleral icterus.    Conjunctiva/sclera: Conjunctivae normal.  Neck:     Thyroid: No thyromegaly.  Cardiovascular:     Rate and Rhythm: Normal rate and regular rhythm.     Heart sounds: Normal heart sounds. No murmur heard. Pulmonary:     Effort: Pulmonary effort is normal. No respiratory distress.      Breath sounds: Normal breath sounds. No wheezing.  Musculoskeletal:        General: No swelling. Normal range of motion.     Cervical back: Neck supple.  Lymphadenopathy:     Cervical: No cervical adenopathy.  Skin:    General: Skin is warm and dry.     Findings: No rash.  Neurological:     Mental Status: He is alert and oriented to person, place, and time.     Coordination: Coordination normal.  Psychiatric:        Behavior: Behavior normal.      Assessment & Plan:   Problem List Items Addressed This Visit       Cardiovascular and Mediastinum   Hypertension   Relevant Medications   rosuvastatin (CRESTOR) 20 MG tablet    Continue current medicine, blood pressure looks a lot better.  He has occasional orthostatic dizziness, will monitor closely Follow up plan: Return in about 6 months (around 05/26/2022), or if symptoms worsen or fail to improve, for htn, .  Counseling provided for all of the vaccine components No orders of the defined types were placed in this encounter.   Caryl Pina, MD [facility] 11/26/2021, 4:10 PM

## 2021-12-02 ENCOUNTER — Other Ambulatory Visit: Payer: Self-pay | Admitting: Family Medicine

## 2021-12-02 DIAGNOSIS — M479 Spondylosis, unspecified: Secondary | ICD-10-CM

## 2021-12-02 DIAGNOSIS — M5136 Other intervertebral disc degeneration, lumbar region: Secondary | ICD-10-CM

## 2022-01-03 ENCOUNTER — Other Ambulatory Visit: Payer: Self-pay | Admitting: Family Medicine

## 2022-01-03 DIAGNOSIS — M479 Spondylosis, unspecified: Secondary | ICD-10-CM

## 2022-01-03 DIAGNOSIS — M5136 Other intervertebral disc degeneration, lumbar region: Secondary | ICD-10-CM

## 2022-03-16 ENCOUNTER — Other Ambulatory Visit: Payer: Self-pay | Admitting: Family Medicine

## 2022-03-16 DIAGNOSIS — M479 Spondylosis, unspecified: Secondary | ICD-10-CM

## 2022-03-16 DIAGNOSIS — M5136 Other intervertebral disc degeneration, lumbar region: Secondary | ICD-10-CM

## 2022-04-13 ENCOUNTER — Other Ambulatory Visit: Payer: Self-pay | Admitting: Family Medicine

## 2022-04-13 DIAGNOSIS — M479 Spondylosis, unspecified: Secondary | ICD-10-CM

## 2022-04-13 DIAGNOSIS — M5136 Other intervertebral disc degeneration, lumbar region: Secondary | ICD-10-CM

## 2022-05-17 ENCOUNTER — Other Ambulatory Visit: Payer: Self-pay | Admitting: Family Medicine

## 2022-05-17 DIAGNOSIS — M5136 Other intervertebral disc degeneration, lumbar region: Secondary | ICD-10-CM

## 2022-05-17 DIAGNOSIS — M479 Spondylosis, unspecified: Secondary | ICD-10-CM

## 2022-05-24 ENCOUNTER — Ambulatory Visit (INDEPENDENT_AMBULATORY_CARE_PROVIDER_SITE_OTHER): Payer: No Typology Code available for payment source | Admitting: Family Medicine

## 2022-05-24 ENCOUNTER — Encounter: Payer: Self-pay | Admitting: Family Medicine

## 2022-05-24 VITALS — BP 139/87 | HR 57 | Temp 97.6°F | Ht 76.0 in | Wt 317.4 lb

## 2022-05-24 DIAGNOSIS — E785 Hyperlipidemia, unspecified: Secondary | ICD-10-CM

## 2022-05-24 DIAGNOSIS — K649 Unspecified hemorrhoids: Secondary | ICD-10-CM

## 2022-05-24 DIAGNOSIS — Z1211 Encounter for screening for malignant neoplasm of colon: Secondary | ICD-10-CM | POA: Diagnosis not present

## 2022-05-24 DIAGNOSIS — I10 Essential (primary) hypertension: Secondary | ICD-10-CM

## 2022-05-24 NOTE — Progress Notes (Signed)
BP 139/87   Pulse (!) 57   Temp 97.6 F (36.4 C)   Ht '6\' 4"'$  (1.93 m)   Wt (!) 317 lb 6.4 oz (144 kg)   SpO2 96%   BMI 38.64 kg/m    Subjective:   Patient ID: Daniel Crane, male    DOB: Oct 19, 1961, 61 y.o.   MRN: [id number]  HPI: Daniel Crane is a 61 y.o. male presenting on 05/24/2022 for Hypertension (6 month)   HPI Hypertension Patient is currently on amlodipine, and their blood pressure today is 149/74. Patient denies any lightheadedness or dizziness. Patient denies headaches, blurred vision, chest pains, shortness of breath, or weakness. Denies any side effects from medication and is content with current medication.   Hyperlipidemia Patient is coming in for recheck of his hyperlipidemia. The patient is currently taking Crestor and fish oils. They deny any issues with myalgias or history of liver damage from it. They deny any focal numbness or weakness or chest pain.   Patient does complain of some blood on his tissue when he wipes and at the end of his stool.  He says it was happening last week but he has not seen an in over a week.  He just wanted to take a note of it.  He says is not having any currently.  He did say he had some constipation and that is kind of what led up to it  Relevant past medical, surgical, family and social history reviewed and updated as indicated. Interim medical history since our last visit reviewed. Allergies and medications reviewed and updated.  Review of Systems  Constitutional:  Negative for chills and fever.  Respiratory:  Negative for shortness of breath and wheezing.   Cardiovascular:  Negative for chest pain and leg swelling.  Gastrointestinal:  Positive for anal bleeding. Negative for blood in stool, constipation and vomiting.  Musculoskeletal:  Negative for back pain and gait problem.  Skin:  Negative for rash.  Neurological:  Negative for dizziness, weakness and light-headedness.  All other systems reviewed and are negative.  Per  HPI unless specifically indicated above   Allergies as of 05/24/2022   No Known Allergies      Medication List        Accurate as of May 24, 2022  9:11 AM. If you have any questions, ask your nurse or doctor.          amLODipine 5 MG tablet Commonly known as: NORVASC Take 1 tablet (5 mg total) by mouth daily.   aspirin EC 81 MG tablet Take 81 mg by mouth daily.   diclofenac 75 MG EC tablet Commonly known as: VOLTAREN Take 1 tablet by mouth twice daily   diclofenac Sodium 1 % Gel Commonly known as: Voltaren Apply 2 g topically 4 (four) times daily.   FISH OIL PO Take 1 tablet by mouth daily.   fluticasone 50 MCG/ACT nasal spray Commonly known as: FLONASE Place 1 spray into both nostrils 2 (two) times daily as needed for allergies or rhinitis.   ibuprofen 200 MG tablet Commonly known as: ADVIL Take 400 mg by mouth every 6 (six) hours as needed for moderate pain.   MAGNESIUM PO Take 2 tablets by mouth 2 (two) times daily.   multivitamin with minerals tablet Take 1 tablet by mouth daily.   NAC 500 MG Caps Generic drug: Acetylcysteine Take by mouth.   rosuvastatin 20 MG tablet Commonly known as: CRESTOR Take 1 tablet (20 mg total) by mouth  daily.   traZODone 50 MG tablet Commonly known as: DESYREL Take 1 tablet (50 mg total) by mouth at bedtime.   Turmeric 400 MG Caps Take 400 mg by mouth 2 (two) times daily.   VITAMIN B12 PO Take 1 tablet by mouth daily.   vitamin C 500 MG tablet Commonly known as: ASCORBIC ACID Take 500 mg by mouth daily.   zinc gluconate 50 MG tablet Take 50 mg by mouth daily.         Objective:   BP 139/87   Pulse (!) 57   Temp 97.6 F (36.4 C)   Ht $R'6\' 4"'mS$  (1.93 m)   Wt (!) 317 lb 6.4 oz (144 kg)   SpO2 96%   BMI 38.64 kg/m   Wt Readings from Last 3 Encounters:  05/24/22 (!) 317 lb 6.4 oz (144 kg)  11/26/21 (!) 322 lb (146.1 kg)  10/30/21 (!) 315 lb 3.2 oz (143 kg)    Physical Exam Vitals and nursing note  reviewed.  Constitutional:      General: He is not in acute distress.    Appearance: He is well-developed. He is not diaphoretic.  Eyes:     General: No scleral icterus.    Conjunctiva/sclera: Conjunctivae normal.  Neck:     Thyroid: No thyromegaly.  Cardiovascular:     Rate and Rhythm: Normal rate and regular rhythm.     Heart sounds: Normal heart sounds. No murmur heard. Pulmonary:     Effort: Pulmonary effort is normal. No respiratory distress.     Breath sounds: Normal breath sounds. No wheezing.  Musculoskeletal:        General: Normal range of motion.     Cervical back: Neck supple.  Lymphadenopathy:     Cervical: No cervical adenopathy.  Skin:    General: Skin is warm and dry.     Findings: No rash.  Neurological:     Mental Status: He is alert and oriented to person, place, and time.     Coordination: Coordination normal.  Psychiatric:        Behavior: Behavior normal.      Assessment & Plan:   Problem List Items Addressed This Visit       Cardiovascular and Mediastinum   Hypertension - Primary   Relevant Orders   CBC with Differential/Platelet   CMP14+EGFR     Other   Hyperlipidemia LDL goal <130   Relevant Orders   CBC with Differential/Platelet   Lipid panel   Other Visit Diagnoses     Colon cancer screening       Relevant Orders   Ambulatory referral to Gastroenterology   Hemorrhoids, unspecified hemorrhoid type           Continue current medicine, no changes.  Recommended hemorrhoid cream over-the-counter for his rectal bleeding and let us know if anything worsens.  Patient declined exam for that today.  No other changes, will check blood work Follow up plan: Return in about 6 months (around 11/24/2022), or if symptoms worsen or fail to improve, for Hypertension and hyperlipidemia.  Counseling provided for all of the vaccine components Orders Placed This Encounter  Procedures   CBC with Differential/Platelet   CMP14+EGFR   Lipid panel    Ambulatory referral to Gastroenterology    Caryl Pina, MD [facility] 05/24/2022, 9:11 AM

## 2022-05-25 LAB — CMP14+EGFR
ALT: 86 IU/L — ABNORMAL HIGH (ref 0–44)
AST: 54 IU/L — ABNORMAL HIGH (ref 0–40)
Albumin/Globulin Ratio: 1.6 (ref 1.2–2.2)
Albumin: 4.5 g/dL (ref 3.8–4.9)
Alkaline Phosphatase: 87 IU/L (ref 44–121)
BUN/Creatinine Ratio: 18 (ref 10–24)
BUN: 17 mg/dL (ref 8–27)
Bilirubin Total: 0.5 mg/dL (ref 0.0–1.2)
CO2: 21 mmol/L (ref 20–29)
Calcium: 9.6 mg/dL (ref 8.6–10.2)
Chloride: 101 mmol/L (ref 96–106)
Creatinine, Ser: 0.97 mg/dL (ref 0.76–1.27)
Globulin, Total: 2.8 g/dL (ref 1.5–4.5)
Glucose: 100 mg/dL — ABNORMAL HIGH (ref 70–99)
Potassium: 4.2 mmol/L (ref 3.5–5.2)
Sodium: 138 mmol/L (ref 134–144)
Total Protein: 7.3 g/dL (ref 6.0–8.5)
eGFR: 89 mL/min/{1.73_m2} (ref >59)

## 2022-05-25 LAB — CBC WITH DIFFERENTIAL/PLATELET
Basophils Absolute: 0.1 10*3/uL (ref 0.0–0.2)
Basos: 1 %
EOS (ABSOLUTE): 0.4 10*3/uL (ref 0.0–0.4)
Eos: 6 %
Hematocrit: 44.3 % (ref 37.5–51.0)
Hemoglobin: 15.2 g/dL (ref 13.0–17.7)
Immature Grans (Abs): 0 10*3/uL (ref 0.0–0.1)
Immature Granulocytes: 0 %
Lymphocytes Absolute: 1.8 10*3/uL (ref 0.7–3.1)
Lymphs: 27 %
MCH: 32.3 pg (ref 26.6–33.0)
MCHC: 34.3 g/dL (ref 31.5–35.7)
MCV: 94 fL (ref 79–97)
Monocytes Absolute: 0.6 10*3/uL (ref 0.1–0.9)
Monocytes: 9 %
Neutrophils Absolute: 3.8 10*3/uL (ref 1.4–7.0)
Neutrophils: 57 %
Platelets: 263 10*3/uL (ref 150–450)
RBC: 4.71 x10E6/uL (ref 4.14–5.80)
RDW: 12.9 % (ref 11.6–15.4)
WBC: 6.6 10*3/uL (ref 3.4–10.8)

## 2022-05-25 LAB — LIPID PANEL
Chol/HDL Ratio: 3.5 ratio (ref 0.0–5.0)
Cholesterol, Total: 140 mg/dL (ref 100–199)
HDL: 40 mg/dL (ref >39)
LDL Chol Calc (NIH): 76 mg/dL (ref 0–99)
Triglycerides: 133 mg/dL (ref 0–149)
VLDL Cholesterol Cal: 24 mg/dL (ref 5–40)

## 2022-05-29 ENCOUNTER — Encounter: Payer: Self-pay | Admitting: *Deleted

## 2022-06-11 ENCOUNTER — Other Ambulatory Visit: Payer: Self-pay | Admitting: Family Medicine

## 2022-06-11 DIAGNOSIS — M479 Spondylosis, unspecified: Secondary | ICD-10-CM

## 2022-06-11 DIAGNOSIS — M5136 Other intervertebral disc degeneration, lumbar region: Secondary | ICD-10-CM

## 2022-06-13 ENCOUNTER — Encounter: Payer: Self-pay | Admitting: *Deleted

## 2022-06-13 NOTE — Patient Instructions (Signed)
Referring MD/PCP: Dr. Warrick Parisian  Procedure: Colonoscopy  Marked having rectal bleeding on form and elevated liver enzymes  Has patient had this procedure before?  Yes, Dr. Gala Romney 10/09/2015  If so, when, by whom and where?    Is there a family history of colon cancer?  no  Who?  What age when diagnosed?    Is patient diabetic? If yes, Type 1 or Type 2   no      Does patient have prosthetic heart valve or mechanical valve?  no  Do you have a pacemaker/defibrillator?  no  Has patient ever had endocarditis/atrial fibrillation? no  Does patient use oxygen? no  Has patient had joint replacement within last 12 months?  no  Is patient constipated or do they take laxatives? no  Does patient have a history of alcohol/drug use?  no  Have you had a stroke/heart attack last 6 mths? no  Do you take medicine for weight loss?  no   Is patient on blood thinner such as Coumadin, Plavix and/or Aspirin?   Medications:  Current Outpatient Medications on File Prior to Visit  Medication Sig Dispense Refill   Acetylcysteine (NAC) 500 MG CAPS Take by mouth.     amLODipine (NORVASC) 5 MG tablet Take 1 tablet (5 mg total) by mouth daily. 90 tablet 3   aspirin EC 81 MG tablet Take 81 mg by mouth daily.     Cyanocobalamin (VITAMIN B12 PO) Take 1 tablet by mouth daily.     diclofenac (VOLTAREN) 75 MG EC tablet Take 1 tablet by mouth twice daily 60 tablet 0   diclofenac Sodium (VOLTAREN) 1 % GEL Apply 2 g topically 4 (four) times daily. 350 g 3   fluticasone (FLONASE) 50 MCG/ACT nasal spray Place 1 spray into both nostrils 2 (two) times daily as needed for allergies or rhinitis. 16 g 6   ibuprofen (ADVIL,MOTRIN) 200 MG tablet Take 400 mg by mouth every 6 (six) hours as needed for moderate pain.     MAGNESIUM PO Take 2 tablets by mouth 2 (two) times daily.     Multiple Vitamins-Minerals (MULTIVITAMIN WITH MINERALS) tablet Take 1 tablet by mouth daily.     Omega-3 Fatty Acids (FISH OIL PO) Take 1  tablet by mouth daily.     rosuvastatin (CRESTOR) 20 MG tablet Take 1 tablet (20 mg total) by mouth daily. 90 tablet 3   traZODone (DESYREL) 50 MG tablet Take 1 tablet (50 mg total) by mouth at bedtime. 90 tablet 3   Turmeric 400 MG CAPS Take 400 mg by mouth 2 (two) times daily.      vitamin C (ASCORBIC ACID) 500 MG tablet Take 500 mg by mouth daily.     zinc gluconate 50 MG tablet Take 50 mg by mouth daily.     No current facility-administered medications on file prior to visit.     Allergies: No Known Allergies

## 2022-06-28 NOTE — Progress Notes (Signed)
Having problems, needs ov.

## 2022-07-03 ENCOUNTER — Encounter: Payer: Self-pay | Admitting: Internal Medicine

## 2022-07-08 ENCOUNTER — Encounter (INDEPENDENT_AMBULATORY_CARE_PROVIDER_SITE_OTHER): Payer: Self-pay

## 2022-07-12 ENCOUNTER — Encounter: Payer: Self-pay | Admitting: Family Medicine

## 2022-07-12 ENCOUNTER — Ambulatory Visit (INDEPENDENT_AMBULATORY_CARE_PROVIDER_SITE_OTHER): Payer: No Typology Code available for payment source | Admitting: Family Medicine

## 2022-07-12 VITALS — BP 145/88 | HR 66 | Temp 98.6°F | Ht 76.0 in | Wt 315.0 lb

## 2022-07-12 DIAGNOSIS — I10 Essential (primary) hypertension: Secondary | ICD-10-CM | POA: Diagnosis not present

## 2022-07-12 DIAGNOSIS — R7989 Other specified abnormal findings of blood chemistry: Secondary | ICD-10-CM

## 2022-07-12 DIAGNOSIS — G4700 Insomnia, unspecified: Secondary | ICD-10-CM | POA: Diagnosis not present

## 2022-07-12 DIAGNOSIS — E785 Hyperlipidemia, unspecified: Secondary | ICD-10-CM

## 2022-07-12 MED ORDER — ROSUVASTATIN CALCIUM 20 MG PO TABS: 20.0000 mg | ORAL_TABLET | Freq: Every day | ORAL | 3 refills | Status: DC

## 2022-07-12 MED ORDER — AMLODIPINE BESYLATE 5 MG PO TABS: 5.0000 mg | ORAL_TABLET | Freq: Every day | ORAL | 3 refills | Status: DC

## 2022-07-12 MED ORDER — TRAZODONE HCL 50 MG PO TABS: 50.0000 mg | ORAL_TABLET | Freq: Every day | ORAL | 3 refills | Status: DC

## 2022-07-12 NOTE — Progress Notes (Signed)
BP (!) 145/88   Pulse 66   Temp 98.6 F (37 C)   Ht _0  (1.93 m)   Wt (!) 315 lb (142.9 kg)   SpO2 97%   BMI 38.34 kg/m    Subjective:   Patient ID: Daniel Crane, male    DOB: 1961/05/19, 61 y.o.   MRN: [id number]  HPI: Daniel Crane is a 61 y.o. male presenting on 07/12/2022 for Medical Management of Chronic Issues   HPI Hypertension Patient is currently on amlodipine, and their blood pressure today is 155/85. Patient denies any lightheadedness or dizziness. Patient denies headaches, blurred vision, chest pains, shortness of breath, or weakness. Denies any side effects from medication and is content with current medication.   Hyperlipidemia Patient is coming in for recheck of his hyperlipidemia. The patient is currently taking Crestor. They deny any issues with myalgias or history of liver damage from it. They deny any focal numbness or weakness or chest pain.   Insomnia Patient is coming in for insomnia recheck.  Currently takes trazodone.  Patient had elevated liver function normal last time, it is increased from where it was a baseline.  He says he did have a drink of alcohol the night before and he is trying not to do that at this time.  Relevant past medical, surgical, family and social history reviewed and updated as indicated. Interim medical history since our last visit reviewed. Allergies and medications reviewed and updated.  Review of Systems  Constitutional:  Negative for chills and fever.  Eyes:  Negative for discharge.  Respiratory:  Negative for shortness of breath and wheezing.   Cardiovascular:  Negative for chest pain and leg swelling.  Musculoskeletal:  Negative for back pain and gait problem.  Skin:  Negative for rash.  Neurological:  Negative for dizziness, weakness and light-headedness.  All other systems reviewed and are negative.   Per HPI unless specifically indicated above   Allergies as of 07/12/2022   No Known Allergies       Medication List        Accurate as of July 12, 2022  8:45 AM. If you have any questions, ask your nurse or doctor.          amLODipine 5 MG tablet Commonly known as: NORVASC Take 1 tablet (5 mg total) by mouth daily.   aspirin EC 81 MG tablet Take 81 mg by mouth daily.   diclofenac 75 MG EC tablet Commonly known as: VOLTAREN Take 1 tablet by mouth twice daily   diclofenac Sodium 1 % Gel Commonly known as: Voltaren Apply 2 g topically 4 (four) times daily.   FISH OIL PO Take 1 tablet by mouth daily.   fluticasone 50 MCG/ACT nasal spray Commonly known as: FLONASE Place 1 spray into both nostrils 2 (two) times daily as needed for allergies or rhinitis.   ibuprofen 200 MG tablet Commonly known as: ADVIL Take 400 mg by mouth every 6 (six) hours as needed for moderate pain.   MAGNESIUM PO Take 2 tablets by mouth 2 (two) times daily.   multivitamin with minerals tablet Take 1 tablet by mouth daily.   NAC 500 MG Caps Generic drug: Acetylcysteine Take by mouth.   rosuvastatin 20 MG tablet Commonly known as: CRESTOR Take 1 tablet (20 mg total) by mouth daily.   traZODone 50 MG tablet Commonly known as: DESYREL Take 1 tablet (50 mg total) by mouth at bedtime.   Turmeric 400 MG Caps Take 400 mg  by mouth 2 (two) times daily.   VITAMIN B12 PO Take 1 tablet by mouth daily.   vitamin C 500 MG tablet Commonly known as: ASCORBIC ACID Take 500 mg by mouth daily.   zinc gluconate 50 MG tablet Take 50 mg by mouth daily.         Objective:   BP (!) 145/88   Pulse 66   Temp 98.6 F (37 C)   Ht _0  (1.93 m)   Wt (!) 315 lb (142.9 kg)   SpO2 97%   BMI 38.34 kg/m   Wt Readings from Last 3 Encounters:  07/12/22 (!) 315 lb (142.9 kg)  05/24/22 (!) 317 lb 6.4 oz (144 kg)  11/26/21 (!) 322 lb (146.1 kg)    Physical Exam Vitals and nursing note reviewed.  Constitutional:      General: He is not in acute distress.    Appearance: He is well-developed. He  is not diaphoretic.  Eyes:     General: No scleral icterus.    Conjunctiva/sclera: Conjunctivae normal.  Neck:     Thyroid: No thyromegaly.  Cardiovascular:     Rate and Rhythm: Normal rate and regular rhythm.     Heart sounds: Normal heart sounds. No murmur heard. Pulmonary:     Effort: Pulmonary effort is normal. No respiratory distress.     Breath sounds: Normal breath sounds. No wheezing.  Musculoskeletal:        General: No swelling. Normal range of motion.     Cervical back: Neck supple.  Lymphadenopathy:     Cervical: No cervical adenopathy.  Skin:    General: Skin is warm and dry.     Findings: No rash.  Neurological:     Mental Status: He is alert and oriented to person, place, and time.     Coordination: Coordination normal.  Psychiatric:        Behavior: Behavior normal.       Assessment & Plan:   Problem List Items Addressed This Visit       Cardiovascular and Mediastinum   Hypertension   Relevant Medications   rosuvastatin (CRESTOR) 20 MG tablet   amLODipine (NORVASC) 5 MG tablet   Other Relevant Orders   CMP14+EGFR     Other   Insomnia   Relevant Medications   traZODone (DESYREL) 50 MG tablet   Hyperlipidemia LDL goal <130 - Primary   Relevant Medications   rosuvastatin (CRESTOR) 20 MG tablet   amLODipine (NORVASC) 5 MG tablet   Other Visit Diagnoses     Hypertension, essential       Relevant Medications   rosuvastatin (CRESTOR) 20 MG tablet   amLODipine (NORVASC) 5 MG tablet       We will recheck liver numbers and see how he is doing.  We adjusted his cholesterol medicine and will see if that is doing a little bit better with the liver numbers. Follow up plan: Return in about 6 months (around 01/12/2023), or if symptoms worsen or fail to improve, for htn and hld.  Counseling provided for all of the vaccine components Orders Placed This Encounter  Procedures   Whittier Zada Haser, MD [facility] 07/12/2022, 8:45 AM

## 2022-07-13 LAB — CMP14+EGFR
ALT: 81 IU/L — ABNORMAL HIGH (ref 0–44)
AST: 49 IU/L — ABNORMAL HIGH (ref 0–40)
Albumin/Globulin Ratio: 1.8 (ref 1.2–2.2)
Albumin: 4.6 g/dL (ref 3.8–4.9)
Alkaline Phosphatase: 78 IU/L (ref 44–121)
BUN/Creatinine Ratio: 16 (ref 10–24)
BUN: 14 mg/dL (ref 8–27)
Bilirubin Total: 0.4 mg/dL (ref 0.0–1.2)
CO2: 22 mmol/L (ref 20–29)
Calcium: 9.5 mg/dL (ref 8.6–10.2)
Chloride: 102 mmol/L (ref 96–106)
Creatinine, Ser: 0.89 mg/dL (ref 0.76–1.27)
Globulin, Total: 2.6 g/dL (ref 1.5–4.5)
Glucose: 105 mg/dL — ABNORMAL HIGH (ref 70–99)
Potassium: 4.3 mmol/L (ref 3.5–5.2)
Sodium: 138 mmol/L (ref 134–144)
Total Protein: 7.2 g/dL (ref 6.0–8.5)
eGFR: 98 mL/min/{1.73_m2} (ref >59)

## 2022-07-22 ENCOUNTER — Other Ambulatory Visit: Payer: Self-pay | Admitting: Family Medicine

## 2022-07-22 DIAGNOSIS — M479 Spondylosis, unspecified: Secondary | ICD-10-CM

## 2022-07-22 DIAGNOSIS — M5136 Other intervertebral disc degeneration, lumbar region: Secondary | ICD-10-CM

## 2022-08-05 ENCOUNTER — Ambulatory Visit: Payer: No Typology Code available for payment source | Admitting: Gastroenterology

## 2022-08-12 ENCOUNTER — Ambulatory Visit: Payer: No Typology Code available for payment source | Admitting: Family Medicine

## 2022-08-12 ENCOUNTER — Encounter: Payer: Self-pay | Admitting: Family Medicine

## 2022-08-12 VITALS — BP 137/79 | HR 81 | Temp 98.0°F | Ht 76.0 in | Wt 309.0 lb

## 2022-08-12 DIAGNOSIS — G4700 Insomnia, unspecified: Secondary | ICD-10-CM

## 2022-08-12 DIAGNOSIS — E785 Hyperlipidemia, unspecified: Secondary | ICD-10-CM

## 2022-08-12 DIAGNOSIS — I10 Essential (primary) hypertension: Secondary | ICD-10-CM

## 2022-08-12 NOTE — Progress Notes (Signed)
BP 137/79   Pulse 81   Temp 98 F (36.7 C)   Ht 6' 4" (1.93 m)   Wt (!) 309 lb (140.2 kg)   SpO2 97%   BMI 37.61 kg/m    Subjective:   Patient ID: Daniel Crane, male    DOB: [birth year], 61 y.o.   MRN: [id number]  HPI: Daniel Crane is a 61 y.o. male presenting on 08/12/2022 for Medical Management of Chronic Issues, Hyperlipidemia, and Hypertension   HPI Hypertension Patient is currently on amlodipine, and their blood pressure today is 137/79. Patient denies any lightheadedness or dizziness. Patient denies headaches, blurred vision, chest pains, shortness of breath, or weakness. Denies any side effects from medication and is content with current medication.   Hyperlipidemia Patient is coming in for recheck of his hyperlipidemia. The patient is currently taking Crestor. They deny any issues with myalgias or history of liver damage from it. They deny any focal numbness or weakness or chest pain.   Insomnia recheck Patient uses trazodone to help with insomnia and says that it is working well for him.  He denies any major issues or side effects.  Uses it as needed.  Relevant past medical, surgical, family and social history reviewed and updated as indicated. Interim medical history since our last visit reviewed. Allergies and medications reviewed and updated.  Review of Systems  Constitutional:  Negative for chills and fever.  Eyes:  Negative for visual disturbance.  Respiratory:  Negative for shortness of breath and wheezing.   Cardiovascular:  Negative for chest pain and leg swelling.  Musculoskeletal:  Negative for back pain and gait problem.  Skin:  Negative for rash.  Neurological:  Negative for dizziness, weakness and light-headedness.  All other systems reviewed and are negative.   Per HPI unless specifically indicated above   Allergies as of 08/12/2022   No Known Allergies      Medication List        Accurate as of August 12, 2022  3:15 PM. If you have any  questions, ask your nurse or doctor.          amLODipine 5 MG tablet Commonly known as: NORVASC Take 1 tablet (5 mg total) by mouth daily.   ascorbic acid 500 MG tablet Commonly known as: VITAMIN C Take 500 mg by mouth daily.   aspirin EC 81 MG tablet Take 81 mg by mouth daily.   diclofenac 75 MG EC tablet Commonly known as: VOLTAREN Take 1 tablet by mouth twice daily   diclofenac Sodium 1 % Gel Commonly known as: Voltaren Apply 2 g topically 4 (four) times daily.   FISH OIL PO Take 1 tablet by mouth daily.   fluticasone 50 MCG/ACT nasal spray Commonly known as: FLONASE Place 1 spray into both nostrils 2 (two) times daily as needed for allergies or rhinitis.   ibuprofen 200 MG tablet Commonly known as: ADVIL Take 400 mg by mouth every 6 (six) hours as needed for moderate pain.   MAGNESIUM PO Take 2 tablets by mouth 2 (two) times daily.   multivitamin with minerals tablet Take 1 tablet by mouth daily.   NAC 500 MG Caps Generic drug: Acetylcysteine Take by mouth.   rosuvastatin 20 MG tablet Commonly known as: CRESTOR Take 1 tablet (20 mg total) by mouth daily.   traZODone 50 MG tablet Commonly known as: DESYREL Take 1 tablet (50 mg total) by mouth at bedtime.   Turmeric 400 MG Caps Take 400 mg by  mouth 2 (two) times daily.   VITAMIN B12 PO Take 1 tablet by mouth daily.   zinc gluconate 50 MG tablet Take 50 mg by mouth daily.         Objective:   BP 137/79   Pulse 81   Temp 98 F (36.7 C)   Ht 6' 4" (1.93 m)   Wt (!) 309 lb (140.2 kg)   SpO2 97%   BMI 37.61 kg/m   Wt Readings from Last 3 Encounters:  08/12/22 (!) 309 lb (140.2 kg)  07/12/22 (!) 315 lb (142.9 kg)  05/24/22 (!) 317 lb 6.4 oz (144 kg)    Physical Exam Vitals and nursing note reviewed.  Constitutional:      General: He is not in acute distress.    Appearance: He is well-developed. He is not diaphoretic.  Eyes:     General: No scleral icterus.    Conjunctiva/sclera:  Conjunctivae normal.  Neck:     Thyroid: No thyromegaly.  Cardiovascular:     Rate and Rhythm: Normal rate and regular rhythm.     Heart sounds: Normal heart sounds. No murmur heard. Pulmonary:     Effort: Pulmonary effort is normal. No respiratory distress.     Breath sounds: Normal breath sounds. No wheezing.  Musculoskeletal:        General: No swelling. Normal range of motion.     Cervical back: Neck supple.  Lymphadenopathy:     Cervical: No cervical adenopathy.  Skin:    General: Skin is warm and dry.     Findings: No rash.  Neurological:     Mental Status: He is alert and oriented to person, place, and time.     Coordination: Coordination normal.  Psychiatric:        Behavior: Behavior normal.       Assessment & Plan:   Problem List Items Addressed This Visit       Cardiovascular and Mediastinum   Hypertension - Primary   Relevant Orders   CBC with Differential/Platelet   CMP14+EGFR   Lipid panel     Other   Insomnia   Relevant Orders   CBC with Differential/Platelet   CMP14+EGFR   Lipid panel   Hyperlipidemia LDL goal <130   Relevant Orders   CBC with Differential/Platelet   CMP14+EGFR   Lipid panel    He has been trying to lose weight and trying to eat a little more healthy and has been doing well with that. Follow up plan: Return in about 6 months (around 02/12/2023), or if symptoms worsen or fail to improve, for physical.  Counseling provided for all of the vaccine components Orders Placed This Encounter  Procedures   CBC with Differential/Platelet   CMP14+EGFR   Lipid panel    Caryl Pina, MD [facility] 08/12/2022, 3:15 PM

## 2022-08-13 LAB — CMP14+EGFR
ALT: 65 IU/L — ABNORMAL HIGH (ref 0–44)
AST: 46 IU/L — ABNORMAL HIGH (ref 0–40)
Albumin/Globulin Ratio: 1.6 (ref 1.2–2.2)
Albumin: 4.7 g/dL (ref 3.8–4.9)
Alkaline Phosphatase: 71 IU/L (ref 44–121)
BUN/Creatinine Ratio: 15 (ref 10–24)
BUN: 13 mg/dL (ref 8–27)
Bilirubin Total: 0.5 mg/dL (ref 0.0–1.2)
CO2: 19 mmol/L — ABNORMAL LOW (ref 20–29)
Calcium: 9.9 mg/dL (ref 8.6–10.2)
Chloride: 105 mmol/L (ref 96–106)
Creatinine, Ser: 0.85 mg/dL (ref 0.76–1.27)
Globulin, Total: 2.9 g/dL (ref 1.5–4.5)
Glucose: 90 mg/dL (ref 70–99)
Potassium: 4.4 mmol/L (ref 3.5–5.2)
Sodium: 141 mmol/L (ref 134–144)
Total Protein: 7.6 g/dL (ref 6.0–8.5)
eGFR: 99 mL/min/{1.73_m2} (ref >59)

## 2022-08-13 LAB — CBC WITH DIFFERENTIAL/PLATELET
Basophils Absolute: 0.1 10*3/uL (ref 0.0–0.2)
Basos: 1 %
EOS (ABSOLUTE): 0.2 10*3/uL (ref 0.0–0.4)
Eos: 2 %
Hematocrit: 46.7 % (ref 37.5–51.0)
Hemoglobin: 15.9 g/dL (ref 13.0–17.7)
Immature Grans (Abs): 0 10*3/uL (ref 0.0–0.1)
Immature Granulocytes: 0 %
Lymphocytes Absolute: 1.5 10*3/uL (ref 0.7–3.1)
Lymphs: 17 %
MCH: 32.1 pg (ref 26.6–33.0)
MCHC: 34 g/dL (ref 31.5–35.7)
MCV: 94 fL (ref 79–97)
Monocytes Absolute: 0.8 10*3/uL (ref 0.1–0.9)
Monocytes: 8 %
Neutrophils Absolute: 6.4 10*3/uL (ref 1.4–7.0)
Neutrophils: 72 %
Platelets: 276 10*3/uL (ref 150–450)
RBC: 4.95 x10E6/uL (ref 4.14–5.80)
RDW: 13 % (ref 11.6–15.4)
WBC: 9 10*3/uL (ref 3.4–10.8)

## 2022-08-13 LAB — LIPID PANEL
Chol/HDL Ratio: 3.3 ratio (ref 0.0–5.0)
Cholesterol, Total: 158 mg/dL (ref 100–199)
HDL: 48 mg/dL (ref >39)
LDL Chol Calc (NIH): 83 mg/dL (ref 0–99)
Triglycerides: 154 mg/dL — ABNORMAL HIGH (ref 0–149)
VLDL Cholesterol Cal: 27 mg/dL (ref 5–40)

## 2022-08-18 NOTE — Progress Notes (Unsigned)
GI Office Note    Referring Provider: Dettinger, [name], MD Primary Care Physician:  Dettinger, [name], MD  Primary Gastroenterologist:  Chief Complaint   No chief complaint on file.    History of Present Illness   Daniel Crane is a 61 y.o. male presenting today    Patient reported abnormal LFTs on his triage sheet. He had normal LFTs 05/2021. In 08/2021 his AS 41 and ALT 60. Numbers up several times with the highest ALT of 54 and ALT 86 in 04/2022. Last week his AST was 46 and ALT 65.   In 2017 he had negative Hep C Ab and HCV RNA.  Colonoscopy in 2016: 3 tubular adenomas removed. Colonic diverticulosis. 3 year surveillance advised.    Medications   Current Outpatient Medications  Medication Sig Dispense Refill   Acetylcysteine (NAC) 500 MG CAPS Take by mouth.     amLODipine (NORVASC) 5 MG tablet Take 1 tablet (5 mg total) by mouth daily. 90 tablet 3   aspirin EC 81 MG tablet Take 81 mg by mouth daily.     Cyanocobalamin (VITAMIN B12 PO) Take 1 tablet by mouth daily.     diclofenac (VOLTAREN) 75 MG EC tablet Take 1 tablet by mouth twice daily 60 tablet 0   diclofenac Sodium (VOLTAREN) 1 % GEL Apply 2 g topically 4 (four) times daily. 350 g 3   fluticasone (FLONASE) 50 MCG/ACT nasal spray Place 1 spray into both nostrils 2 (two) times daily as needed for allergies or rhinitis. 16 g 6   ibuprofen (ADVIL,MOTRIN) 200 MG tablet Take 400 mg by mouth every 6 (six) hours as needed for moderate pain.     MAGNESIUM PO Take 2 tablets by mouth 2 (two) times daily.     Multiple Vitamins-Minerals (MULTIVITAMIN WITH MINERALS) tablet Take 1 tablet by mouth daily.     Omega-3 Fatty Acids (FISH OIL PO) Take 1 tablet by mouth daily.     rosuvastatin (CRESTOR) 20 MG tablet Take 1 tablet (20 mg total) by mouth daily. 90 tablet 3   traZODone (DESYREL) 50 MG tablet Take 1 tablet (50 mg total) by mouth at bedtime. 90 tablet 3   Turmeric 400 MG CAPS Take 400 mg by mouth 2 (two) times  daily.      vitamin C (ASCORBIC ACID) 500 MG tablet Take 500 mg by mouth daily.     zinc gluconate 50 MG tablet Take 50 mg by mouth daily.     No current facility-administered medications for this visit.    Allergies   Allergies as of 08/19/2022   (No Known Allergies)    Past Medical History   Past Medical History:  Diagnosis Date   Anginal pain ([place])    history of 11 years ago. No meds at present.   Arthritis    Collagen vascular disease ([place])    Heart disease    catheterization in remote past, medically managed   Hypercholesteremia     Past Surgical History   Past Surgical History:  Procedure Laterality Date   APPENDECTOMY     CARDIAC CATHETERIZATION     CATARACT EXTRACTION W/PHACO Left 04/25/2016   Procedure: CATARACT EXTRACTION PHACO AND INTRAOCULAR LENS PLACEMENT (IOC);  Surgeon: Baruch Goldmann, MD;  Location: [facility];  Service: Ophthalmology;  Laterality: Left;  CDE 3.23   CATARACT EXTRACTION W/PHACO Right 05/23/2016   Procedure: CATARACT EXTRACTION PHACO AND INTRAOCULAR LENS PLACEMENT RIGHT EYE CDE=1.11;  Surgeon: Baruch Goldmann, MD;  Location:  [facility];  Service: Ophthalmology;  Laterality: Right;   COLONOSCOPY N/A 10/09/2015   Procedure: COLONOSCOPY;  Surgeon: Daneil Dolin, MD;  Location: [facility];  Service: Endoscopy;  Laterality: N/A;  1230 - pt can't come earlier - transportation issues   dental implants      Past Family History   Family History  Problem Relation Age of Onset   Other Mother        Chron's Dz   Other Father        shot by wife, patient's step mother   Alcoholism Father    Hypertension Father    Other Sister        h/o substance abuse   Thyroid disease Sister    Hypertension Brother    Heart attack Paternal Grandfather    Stroke Brother    Colon cancer Neg Hx     Past Social History   Social History   Socioeconomic History   Marital status: Married    Spouse name: Not on file   Number of children: Not on file   Years of  education: Not on file   Highest education level: Not on file  Occupational History   Not on file  Tobacco Use   Smoking status: Former    Types: Pipe, Cigarettes    Start date: 12/15/1978   Smokeless tobacco: Never   Tobacco comments:    quit smoking cigarettes age 89 & started smoking a pipe when he was in his mid 57's  Vaping Use   Vaping Use: Never used  Substance and Sexual Activity   Alcohol use: Yes    Alcohol/week: 2.0 standard drinks of alcohol    Types: 2 Cans of beer per week    Comment: 2-3 times per week    Drug use: No   Sexual activity: Yes    Birth control/protection: None  Other Topics Concern   Not on file  Social History Narrative   Not on file   Social Determinants of Health   Financial Resource Strain: Not on file  Food Insecurity: Not on file  Transportation Needs: Not on file  Physical Activity: Not on file  Stress: Not on file  Social Connections: Not on file  Intimate Partner Violence: Not on file    Review of Systems   General: Negative for anorexia, weight loss, fever, chills, fatigue, weakness. Eyes: Negative for vision changes.  ENT: Negative for hoarseness, difficulty swallowing , nasal congestion. CV: Negative for chest pain, angina, palpitations, dyspnea on exertion, peripheral edema.  Respiratory: Negative for dyspnea at rest, dyspnea on exertion, cough, sputum, wheezing.  GI: See history of present illness. GU:  Negative for dysuria, hematuria, urinary incontinence, urinary frequency, nocturnal urination.  MS: Negative for joint pain, low back pain.  Derm: Negative for rash or itching.  Neuro: Negative for weakness, abnormal sensation, seizure, frequent headaches, memory loss,  confusion.  Psych: Negative for anxiety, depression, suicidal ideation, hallucinations.  Endo: Negative for unusual weight change.  Heme: Negative for bruising or bleeding. Allergy: Negative for rash or hives.  Physical Exam   There were no vitals taken  for this visit.   General: Well-nourished, well-developed in no acute distress.  Head: Normocephalic, atraumatic.   Eyes: Conjunctiva pink, no icterus. Mouth: Oropharyngeal mucosa moist and pink , no lesions erythema or exudate. Neck: Supple without thyromegaly, masses, or lymphadenopathy.  Lungs: Clear to auscultation bilaterally.  Heart: Regular rate and rhythm, no murmurs rubs or gallops.  Abdomen: Bowel sounds  are normal, nontender, nondistended, no hepatosplenomegaly or masses,  no abdominal bruits or hernia, no rebound or guarding.   Rectal: *** Extremities: No lower extremity edema. No clubbing or deformities.  Neuro: Alert and oriented x 4 , grossly normal neurologically.  Skin: Warm and dry, no rash or jaundice.   Psych: Alert and cooperative, normal mood and affect.  Labs   *** Imaging Studies   No results found.  Assessment       PLAN   ***   Laureen Ochs. Bobby Rumpf, [place], [facility]

## 2022-08-19 ENCOUNTER — Encounter: Payer: Self-pay | Admitting: *Deleted

## 2022-08-19 ENCOUNTER — Ambulatory Visit (INDEPENDENT_AMBULATORY_CARE_PROVIDER_SITE_OTHER): Payer: No Typology Code available for payment source | Admitting: Gastroenterology

## 2022-08-19 ENCOUNTER — Encounter: Payer: Self-pay | Admitting: Gastroenterology

## 2022-08-19 VITALS — BP 146/82 | HR 76 | Temp 98.4°F | Ht 76.0 in | Wt 316.2 lb

## 2022-08-19 DIAGNOSIS — R7989 Other specified abnormal findings of blood chemistry: Secondary | ICD-10-CM | POA: Diagnosis not present

## 2022-08-19 DIAGNOSIS — Z86010 Personal history of colonic polyps: Secondary | ICD-10-CM

## 2022-08-19 NOTE — Patient Instructions (Signed)
Abdominal ultrasound to evaluate fatty liver/abnormal liver labs. Colonoscopy to be scheduled in 10/2022. If you have not been called 6 weeks before your preferred date, please reach out to our office.

## 2022-08-28 ENCOUNTER — Ambulatory Visit (HOSPITAL_COMMUNITY)
Admission: RE | Admit: 2022-08-28 | Discharge: 2022-08-28 | Disposition: A | Discharge status: Home / Self Care | Payer: No Typology Code available for payment source | Source: Ambulatory Visit | Attending: Gastroenterology | Admitting: Gastroenterology

## 2022-08-28 ENCOUNTER — Other Ambulatory Visit (HOSPITAL_COMMUNITY)
Admission: RE | Admit: 2022-08-28 | Discharge: 2022-08-28 | Disposition: A | Discharge status: Home / Self Care | Payer: No Typology Code available for payment source | Source: Ambulatory Visit | Attending: Gastroenterology | Admitting: Gastroenterology

## 2022-08-28 DIAGNOSIS — R7989 Other specified abnormal findings of blood chemistry: Secondary | ICD-10-CM | POA: Diagnosis present

## 2022-08-29 LAB — HCV RNA QUANT: HCV Quantitative: NOT DETECTED IU/mL (ref >50)

## 2022-09-04 ENCOUNTER — Other Ambulatory Visit: Payer: Self-pay | Admitting: Family Medicine

## 2022-09-04 DIAGNOSIS — M479 Spondylosis, unspecified: Secondary | ICD-10-CM

## 2022-09-04 DIAGNOSIS — M5136 Other intervertebral disc degeneration, lumbar region: Secondary | ICD-10-CM

## 2022-09-30 ENCOUNTER — Telehealth: Payer: Self-pay | Admitting: *Deleted

## 2022-09-30 MED ORDER — PEG 3350-KCL-NA BICARB-NACL 420 G PO SOLR: 4000.0000 mL | Freq: Once | ORAL | 0 refills | Status: AC

## 2022-09-30 NOTE — Telephone Encounter (Signed)
Called pt. Scheduled for TCS with Dr. Gala Romney, ASA 3 on 11/1 at [facility] will send prep instructions/pre-op appt. Rx for prep sent to pharmacy.  PA submitted via [place]. No PA required. Decision ID #:[id number]

## 2022-10-01 ENCOUNTER — Encounter: Payer: Self-pay | Admitting: *Deleted

## 2022-10-02 ENCOUNTER — Encounter: Payer: Self-pay | Admitting: *Deleted

## 2022-10-10 ENCOUNTER — Other Ambulatory Visit: Payer: Self-pay | Admitting: Family Medicine

## 2022-10-10 DIAGNOSIS — M479 Spondylosis, unspecified: Secondary | ICD-10-CM

## 2022-10-10 DIAGNOSIS — M5136 Other intervertebral disc degeneration, lumbar region: Secondary | ICD-10-CM

## 2022-10-24 NOTE — Patient Instructions (Signed)
Daniel Crane  10/24/2022     '@PREFPERIOPPHARMACY'$ @   Your procedure is scheduled on  10/31/2022.   Report to [facility] at  0600  A.M.   Call this number if you have problems the morning of surgery:  [phone]  If you experience any cold or flu symptoms such as cough, fever, chills, shortness of breath, etc. between now and your scheduled surgery, please notify us at the above number.   Remember:  Follow the diet and prep instructions given to you by the office.     Take these medicines the morning of surgery with A SIP OF WATER                                            amlodipine.     Do not wear jewelry, make-up or nail polish.  Do not wear lotions, powders, or perfumes, or deodorant.  Do not shave 48 hours prior to surgery.  Men may shave face and neck.  Do not bring valuables to the hospital.  [facility] is not responsible for any belongings or valuables.  Contacts, dentures or bridgework may not be worn into surgery.  Leave your suitcase in the car.  After surgery it may be brought to your room.  For patients admitted to the hospital, discharge time will be determined by your treatment team.  Patients discharged the day of surgery will not be allowed to drive home and must have someone with them for 24 hours.    Special instructions:   DO NOT smoke tobacco or vape for 24 hours before your procedure.  Please read over the following fact sheets that you were given. Anesthesia Post-op Instructions and Care and Recovery After Surgery      Colonoscopy, Adult, Care After The following information offers guidance on how to care for yourself after your procedure. Your health care provider may also give you more specific instructions. If you have problems or questions, contact your health care provider. What can I expect after the procedure? After the procedure, it is common to have: A small amount of blood in your stool for 24 hours after the  procedure. Some gas. Mild cramping or bloating of your abdomen. Follow these instructions at home: Eating and drinking  Drink enough fluid to keep your urine pale yellow. Follow instructions from your health care provider about eating or drinking restrictions. Resume your normal diet as told by your health care provider. Avoid heavy or fried foods that are hard to digest. Activity Rest as told by your health care provider. Avoid sitting for a long time without moving. Get up to take short walks every 1-2 hours. This is important to improve blood flow and breathing. Ask for help if you feel weak or unsteady. Return to your normal activities as told by your health care provider. Ask your health care provider what activities are safe for you. Managing cramping and bloating  Try walking around when you have cramps or feel bloated. If directed, apply heat to your abdomen as told by your health care provider. Use the heat source that your health care provider recommends, such as a moist heat pack or a heating pad. Place a towel between your skin and the heat source. Leave the heat on for 20-30 minutes. Remove the heat if your skin turns bright red. This  is especially important if you are unable to feel pain, heat, or cold. You have a greater risk of getting burned. General instructions If you were given a sedative during the procedure, it can affect you for several hours. Do not drive or operate machinery until your health care provider says that it is safe. For the first 24 hours after the procedure: Do not sign important documents. Do not drink alcohol. Do your regular daily activities at a slower pace than normal. Eat soft foods that are easy to digest. Take over-the-counter and prescription medicines only as told by your health care provider. Keep all follow-up visits. This is important. Contact a health care provider if: You have blood in your stool 2-3 days after the procedure. Get  help right away if: You have more than a small spotting of blood in your stool. You have large blood clots in your stool. You have swelling of your abdomen. You have nausea or vomiting. You have a fever. You have increasing pain in your abdomen that is not relieved with medicine. These symptoms may be an emergency. Get help right away. Call 911. Do not wait to see if the symptoms will go away. Do not drive yourself to the hospital. Summary After the procedure, it is common to have a small amount of blood in your stool. You may also have mild cramping and bloating of your abdomen. If you were given a sedative during the procedure, it can affect you for several hours. Do not drive or operate machinery until your health care provider says that it is safe. Get help right away if you have a lot of blood in your stool, nausea or vomiting, a fever, or increased pain in your abdomen. This information is not intended to replace advice given to you by your health care provider. Make sure you discuss any questions you have with your health care provider. Document Revised: 08/08/2021 Document Reviewed: 08/08/2021 Elsevier Patient Education  [place] After The following information offers guidance on how to care for yourself after your procedure. Your health care provider may also give you more specific instructions. If you have problems or questions, contact your health care provider. What can I expect after the procedure? After the procedure, it is common to have: Tiredness. Little or no memory about what happened during or after the procedure. Impaired judgment when it comes to making decisions. Nausea or vomiting. Some trouble with balance. Follow these instructions at home: For the time period you were told by your health care provider:  Rest. Do not participate in activities where you could fall or become injured. Do not drive or use machinery. Do  not drink alcohol. Do not take sleeping pills or medicines that cause drowsiness. Do not make important decisions or sign legal documents. Do not take care of children on your own. Medicines Take over-the-counter and prescription medicines only as told by your health care provider. If you were prescribed antibiotics, take them as told by your health care provider. Do not stop using the antibiotic even if you start to feel better. Eating and drinking Follow instructions from your health care provider about what you may eat and drink. Drink enough fluid to keep your urine pale yellow. If you vomit: Drink clear fluids slowly and in small amounts as you are able. Clear fluids include water, ice chips, low-calorie sports drinks, and fruit juice that has water added to it (diluted fruit juice). Eat light and bland  foods in small amounts as you are able. These foods include bananas, applesauce, rice, lean meats, toast, and crackers. General instructions  Have a responsible adult stay with you for the time you are told. It is important to have someone help care for you until you are awake and alert. If you have sleep apnea, surgery and some medicines can increase your risk for breathing problems. Follow instructions from your health care provider about wearing your sleep device: When you are sleeping. This includes during daytime naps. While taking prescription pain medicines, sleeping medicines, or medicines that make you drowsy. Do not use any products that contain nicotine or tobacco. These products include cigarettes, chewing tobacco, and vaping devices, such as e-cigarettes. If you need help quitting, ask your health care provider. Contact a health care provider if: You feel nauseous or vomit every time you eat or drink. You feel light-headed. You are still sleepy or having trouble with balance after 24 hours. You get a rash. You have a fever. You have redness or swelling around the IV  site. Get help right away if: You have trouble breathing. You have new confusion after you get home. These symptoms may be an emergency. Get help right away. Call 911. Do not wait to see if the symptoms will go away. Do not drive yourself to the hospital. This information is not intended to replace advice given to you by your health care provider. Make sure you discuss any questions you have with your health care provider. Document Revised: 05/13/2022 Document Reviewed: 05/13/2022 Elsevier Patient Education  [place].

## 2022-10-25 ENCOUNTER — Encounter (HOSPITAL_COMMUNITY): Payer: Self-pay

## 2022-10-25 ENCOUNTER — Other Ambulatory Visit: Payer: Self-pay

## 2022-10-25 ENCOUNTER — Encounter (HOSPITAL_COMMUNITY)
Admission: RE | Admit: 2022-10-25 | Discharge: 2022-10-25 | Disposition: A | Discharge status: Home / Self Care | Payer: No Typology Code available for payment source | Source: Ambulatory Visit | Attending: Internal Medicine | Admitting: Internal Medicine

## 2022-10-25 VITALS — BP 153/83 | HR 65 | Temp 98.4°F | Resp 18 | Ht 76.0 in | Wt 316.1 lb

## 2022-10-25 DIAGNOSIS — I10 Essential (primary) hypertension: Secondary | ICD-10-CM | POA: Diagnosis not present

## 2022-10-25 DIAGNOSIS — Z01810 Encounter for preprocedural cardiovascular examination: Secondary | ICD-10-CM | POA: Diagnosis present

## 2022-10-25 HISTORY — DX: Sleep apnea, unspecified: G47.30

## 2022-10-25 HISTORY — DX: Essential (primary) hypertension: I10

## 2022-10-31 ENCOUNTER — Encounter (HOSPITAL_COMMUNITY): Payer: Self-pay | Admitting: Emergency Medicine

## 2022-10-31 ENCOUNTER — Other Ambulatory Visit: Payer: Self-pay

## 2022-10-31 ENCOUNTER — Ambulatory Visit (HOSPITAL_BASED_OUTPATIENT_CLINIC_OR_DEPARTMENT_OTHER)
Admission: RE | Admit: 2022-10-31 | Discharge: 2022-10-31 | Disposition: A | Discharge status: Home / Self Care | Payer: No Typology Code available for payment source | Source: Home / Self Care | Attending: Internal Medicine | Admitting: Internal Medicine

## 2022-10-31 ENCOUNTER — Ambulatory Visit (HOSPITAL_COMMUNITY): Payer: No Typology Code available for payment source | Admitting: Anesthesiology

## 2022-10-31 ENCOUNTER — Encounter (HOSPITAL_COMMUNITY)
Admission: RE | Disposition: A | Discharge status: Home / Self Care | Payer: Self-pay | Source: Home / Self Care | Attending: Internal Medicine

## 2022-10-31 ENCOUNTER — Telehealth: Payer: Self-pay

## 2022-10-31 ENCOUNTER — Encounter (HOSPITAL_COMMUNITY): Payer: Self-pay | Admitting: Internal Medicine

## 2022-10-31 ENCOUNTER — Observation Stay (HOSPITAL_COMMUNITY)
Admission: EM | Admit: 2022-10-31 | Discharge: 2022-11-02 | Disposition: A | Discharge status: Home / Self Care | Payer: No Typology Code available for payment source | Attending: Family Medicine | Admitting: Family Medicine

## 2022-10-31 ENCOUNTER — Ambulatory Visit (HOSPITAL_BASED_OUTPATIENT_CLINIC_OR_DEPARTMENT_OTHER): Payer: No Typology Code available for payment source | Admitting: Anesthesiology

## 2022-10-31 DIAGNOSIS — K5730 Diverticulosis of large intestine without perforation or abscess without bleeding: Secondary | ICD-10-CM | POA: Insufficient documentation

## 2022-10-31 DIAGNOSIS — D124 Benign neoplasm of descending colon: Secondary | ICD-10-CM | POA: Insufficient documentation

## 2022-10-31 DIAGNOSIS — D72829 Elevated white blood cell count, unspecified: Secondary | ICD-10-CM | POA: Diagnosis not present

## 2022-10-31 DIAGNOSIS — Z09 Encounter for follow-up examination after completed treatment for conditions other than malignant neoplasm: Secondary | ICD-10-CM

## 2022-10-31 DIAGNOSIS — D123 Benign neoplasm of transverse colon: Secondary | ICD-10-CM

## 2022-10-31 DIAGNOSIS — Z79899 Other long term (current) drug therapy: Secondary | ICD-10-CM | POA: Insufficient documentation

## 2022-10-31 DIAGNOSIS — Z86010 Personal history of colonic polyps: Secondary | ICD-10-CM | POA: Insufficient documentation

## 2022-10-31 DIAGNOSIS — E782 Mixed hyperlipidemia: Secondary | ICD-10-CM

## 2022-10-31 DIAGNOSIS — M1990 Unspecified osteoarthritis, unspecified site: Secondary | ICD-10-CM | POA: Insufficient documentation

## 2022-10-31 DIAGNOSIS — G4730 Sleep apnea, unspecified: Secondary | ICD-10-CM | POA: Insufficient documentation

## 2022-10-31 DIAGNOSIS — E876 Hypokalemia: Secondary | ICD-10-CM | POA: Diagnosis not present

## 2022-10-31 DIAGNOSIS — I10 Essential (primary) hypertension: Secondary | ICD-10-CM | POA: Insufficient documentation

## 2022-10-31 DIAGNOSIS — Z7982 Long term (current) use of aspirin: Secondary | ICD-10-CM | POA: Insufficient documentation

## 2022-10-31 DIAGNOSIS — Y838 Other surgical procedures as the cause of abnormal reaction of the patient, or of later complication, without mention of misadventure at the time of the procedure: Secondary | ICD-10-CM | POA: Insufficient documentation

## 2022-10-31 DIAGNOSIS — K922 Gastrointestinal hemorrhage, unspecified: Secondary | ICD-10-CM | POA: Diagnosis present

## 2022-10-31 DIAGNOSIS — Z87891 Personal history of nicotine dependence: Secondary | ICD-10-CM | POA: Insufficient documentation

## 2022-10-31 DIAGNOSIS — D125 Benign neoplasm of sigmoid colon: Secondary | ICD-10-CM | POA: Insufficient documentation

## 2022-10-31 DIAGNOSIS — T184XXA Foreign body in colon, initial encounter: Secondary | ICD-10-CM | POA: Insufficient documentation

## 2022-10-31 DIAGNOSIS — Z8249 Family history of ischemic heart disease and other diseases of the circulatory system: Secondary | ICD-10-CM | POA: Insufficient documentation

## 2022-10-31 DIAGNOSIS — D62 Acute posthemorrhagic anemia: Secondary | ICD-10-CM | POA: Insufficient documentation

## 2022-10-31 DIAGNOSIS — Z6836 Body mass index (BMI) 36.0-36.9, adult: Secondary | ICD-10-CM | POA: Diagnosis not present

## 2022-10-31 DIAGNOSIS — K649 Unspecified hemorrhoids: Secondary | ICD-10-CM | POA: Insufficient documentation

## 2022-10-31 DIAGNOSIS — I119 Hypertensive heart disease without heart failure: Secondary | ICD-10-CM | POA: Diagnosis not present

## 2022-10-31 DIAGNOSIS — Z1211 Encounter for screening for malignant neoplasm of colon: Secondary | ICD-10-CM | POA: Insufficient documentation

## 2022-10-31 DIAGNOSIS — R7401 Elevation of levels of liver transaminase levels: Secondary | ICD-10-CM

## 2022-10-31 DIAGNOSIS — W449XXA Unspecified foreign body entering into or through a natural orifice, initial encounter: Secondary | ICD-10-CM | POA: Insufficient documentation

## 2022-10-31 DIAGNOSIS — X58XXXA Exposure to other specified factors, initial encounter: Secondary | ICD-10-CM | POA: Diagnosis not present

## 2022-10-31 DIAGNOSIS — K625 Hemorrhage of anus and rectum: Secondary | ICD-10-CM | POA: Diagnosis not present

## 2022-10-31 DIAGNOSIS — K760 Fatty (change of) liver, not elsewhere classified: Secondary | ICD-10-CM | POA: Insufficient documentation

## 2022-10-31 DIAGNOSIS — K91840 Postprocedural hemorrhage and hematoma of a digestive system organ or structure following a digestive system procedure: Principal | ICD-10-CM | POA: Insufficient documentation

## 2022-10-31 HISTORY — PX: HEMOSTASIS CLIP PLACEMENT: SHX6857

## 2022-10-31 HISTORY — PX: POLYPECTOMY: SHX5525

## 2022-10-31 HISTORY — PX: COLONOSCOPY WITH PROPOFOL: SHX5780

## 2022-10-31 LAB — TYPE AND SCREEN
ABO/RH(D): O POS
Antibody Screen: NEGATIVE

## 2022-10-31 LAB — COMPREHENSIVE METABOLIC PANEL
ALT: 98 U/L — ABNORMAL HIGH (ref 0–44)
AST: 66 U/L — ABNORMAL HIGH (ref 15–41)
Albumin: 3.8 g/dL (ref 3.5–5.0)
Alkaline Phosphatase: 56 U/L (ref 38–126)
Anion gap: 12 (ref 5–15)
BUN: 20 mg/dL (ref 6–20)
CO2: 20 mmol/L — ABNORMAL LOW (ref 22–32)
Calcium: 8.8 mg/dL — ABNORMAL LOW (ref 8.9–10.3)
Chloride: 106 mmol/L (ref 98–111)
Creatinine, Ser: 1.26 mg/dL — ABNORMAL HIGH (ref 0.61–1.24)
GFR, Estimated: >60 mL/min (ref >60)
Glucose, Bld: 132 mg/dL — ABNORMAL HIGH (ref 70–99)
Potassium: 4.2 mmol/L (ref 3.5–5.1)
Sodium: 138 mmol/L (ref 135–145)
Total Bilirubin: 0.5 mg/dL (ref 0.3–1.2)
Total Protein: 6.9 g/dL (ref 6.5–8.1)

## 2022-10-31 LAB — CBC
HCT: 41.1 % (ref 39.0–52.0)
Hemoglobin: 14.3 g/dL (ref 13.0–17.0)
MCH: 32.8 pg (ref 26.0–34.0)
MCHC: 34.8 g/dL (ref 30.0–36.0)
MCV: 94.3 fL (ref 80.0–100.0)
Platelets: 319 10*3/uL (ref 150–400)
RBC: 4.36 MIL/uL (ref 4.22–5.81)
RDW: 13.2 % (ref 11.5–15.5)
WBC: 12.5 10*3/uL — ABNORMAL HIGH (ref 4.0–10.5)
nRBC: 0 % (ref 0.0–0.2)

## 2022-10-31 SURGERY — COLONOSCOPY WITH PROPOFOL
Anesthesia: General

## 2022-10-31 MED ORDER — PHENYLEPHRINE HCL (PRESSORS) 10 MG/ML IV SOLN
INTRAVENOUS | Status: DC | PRN
  Administered 2022-10-31 (×3): 160 ug via INTRAVENOUS

## 2022-10-31 MED ORDER — PROPOFOL 10 MG/ML IV BOLUS
INTRAVENOUS | Status: DC | PRN
  Administered 2022-10-31: 100 mg via INTRAVENOUS

## 2022-10-31 MED ORDER — LACTATED RINGERS IV SOLN: INTRAVENOUS | Status: DC

## 2022-10-31 MED ORDER — PROPOFOL 500 MG/50ML IV EMUL
INTRAVENOUS | Status: DC | PRN
  Administered 2022-10-31: 150 ug/kg/min via INTRAVENOUS

## 2022-10-31 MED ORDER — CHLORHEXIDINE GLUCONATE CLOTH 2 % EX PADS
6.0000 | MEDICATED_PAD | Freq: Every day | CUTANEOUS | Status: DC
  Administered 2022-11-01 – 2022-11-02 (×3): 6 via TOPICAL

## 2022-10-31 MED ORDER — SODIUM CHLORIDE 0.9 % IV SOLN
1000.0000 mL | INTRAVENOUS | Status: DC
  Administered 2022-10-31 (×2): 1000 mL via INTRAVENOUS

## 2022-10-31 MED ORDER — SODIUM CHLORIDE 0.9 % IV BOLUS (SEPSIS)
500.0000 mL | Freq: Once | INTRAVENOUS | Status: AC
  Administered 2022-10-31: 500 mL via INTRAVENOUS

## 2022-10-31 NOTE — Anesthesia Preprocedure Evaluation (Signed)
Anesthesia Evaluation  Patient identified by MRN, date of birth, ID band Patient awake    Reviewed: Allergy & Precautions, NPO status , Patient's Chart, lab work & pertinent test results  History of Anesthesia Complications Negative for: history of anesthetic complications  Airway Mallampati: II  TM Distance: >3 FB Neck ROM: Full    Dental  (+) Dental Advisory Given, Missing, Chipped   Pulmonary shortness of breath and with exertion, sleep apnea , former smoker   Pulmonary exam normal breath sounds clear to auscultation       Cardiovascular hypertension, Pt. on medications + angina  Normal cardiovascular exam Rhythm:Regular Rate:Normal     Neuro/Psych    GI/Hepatic negative GI ROS, Neg liver ROS,,,Fatty liver   Endo/Other  negative endocrine ROS    Renal/GU negative Renal ROS     Musculoskeletal  (+) Arthritis , Osteoarthritis,    Abdominal   Peds  Hematology negative hematology ROS (+)   Anesthesia Other Findings   Reproductive/Obstetrics                             Anesthesia Physical Anesthesia Plan  ASA: 3  Anesthesia Plan: General   Post-op Pain Management: Minimal or no pain anticipated   Induction: Intravenous  PONV Risk Score and Plan: Propofol infusion  Airway Management Planned: Nasal Cannula and Natural Airway  Additional Equipment:   Intra-op Plan:   Post-operative Plan:   Informed Consent: I have reviewed the patients History and Physical, chart, labs and discussed the procedure including the risks, benefits and alternatives for the proposed anesthesia with the patient or authorized representative who has indicated his/her understanding and acceptance.     Dental advisory given  Plan Discussed with: CRNA and Surgeon  Anesthesia Plan Comments:         Anesthesia Quick Evaluation

## 2022-10-31 NOTE — Op Note (Signed)
[facility] Patient Name: Daniel Crane Procedure Date: 10/31/2022 7:09 AM MRN: [id number] Date of Birth: September 09, 1961 Attending MD: Norvel Richards , MD, [id number] CSN: [id number] Age: 61 Admit Type: Outpatient Procedure:                Colonoscopy Indications:              High risk colon cancer surveillance: Personal                            history of colonic polyps Providers:                Norvel Richards, MD, Russellton Page, Tammy                            Vaught, RN, Kristine L. Risa Grill, Technician Referring MD:              Medicines:                Propofol per Anesthesia Complications:            No immediate complications. Estimated Blood Loss:     Estimated blood loss was minimal. Procedure:                Pre-Anesthesia Assessment:                           - Prior to the procedure, a History and Physical                            was performed, and patient medications and                            allergies were reviewed. The patient's tolerance of                            previous anesthesia was also reviewed. The risks                            and benefits of the procedure and the sedation                            options and risks were discussed with the patient.                            All questions were answered, and informed consent                            was obtained. Prior Anticoagulants: The patient has                            taken no anticoagulant or antiplatelet agents. ASA                            Grade Assessment: III - A patient with severe  systemic disease. After reviewing the risks and                            benefits, the patient was deemed in satisfactory                            condition to undergo the procedure.                           After obtaining informed consent, the colonoscope                            was passed under direct vision. Throughout the                             procedure, the patient's blood pressure, pulse, and                            oxygen saturations were monitored continuously. The                            [id number]) scope was introduced through the                            anus and advanced to the the cecum, identified by                            appendiceal orifice and ileocecal valve. The                            colonoscopy was performed without difficulty. The                            patient tolerated the procedure well. The quality                            of the bowel preparation was adequate. The                            ileocecal valve, appendiceal orifice, and rectum                            were photographed. The ileocecal valve, appendiceal                            orifice, and rectum were photographed. Scope In: 7:53:12 AM Scope Out: 8:19:47 AM Scope Withdrawal Time: 0 hours 23 minutes 29 seconds  Total Procedure Duration: 0 hours 26 minutes 35 seconds  Findings:      The perianal and digital rectal examinations were normal.      Scattered medium-mouthed diverticula were found in the sigmoid colon and       descending colon.      6 semi-pedunculated polyps were found in the mid descending colon,       transverse colon and mid sigmoid. The  polyps were 4 to 8 mm in size.       These polyps were removed with a cold snare. Resection and retrieval       were complete. Estimated blood loss was minimal. In the mid sigmoid       there was a 1.2 cm pedunculated polyp on a long stalk. It was engaged       with snare hot snare polypectomy attempted but due to technical issues       cold snare polypectomy performed with minimal bleeding. Single clip was       placed which was not approximated in good position; a second clip placed       directly on the stalk with immediate hemostasis. Overall blood loss       minimal Impression:               6 polyps removed from the transverse, descending                             and sigmoid segments 4 to 8 mm cold snared.                           - Diverticulosis in the sigmoid colon and in the                            descending colon.                           -Single 1.2 cm likely polyp in his mid sigmoid                            attempted hot snare but was cold snare due to                            equipment malfunction. Clip placed.                           -Normal hemorrhoids Moderate Sedation:      Moderate (conscious) sedation was personally administered by an       anesthesia professional. The following parameters were monitored: oxygen       saturation, heart rate, blood pressure, respiratory rate, EKG, adequacy       of pulmonary ventilation, and response to care. Recommendation:           - Patient has a contact number available for                            emergencies. The signs and symptoms of potential                            delayed complications were discussed with the                            patient. Return to normal activities tomorrow.                            Written discharge instructions were provided to the  patient.                           - Advance diet as tolerated.                           - Continue present medications.                           - Repeat colonoscopy date to be determined after                            pending pathology results are reviewed for                            surveillance.                           - Return to GI office in 6 weeks. Procedure Code(s):        --- Professional ---                           [phone], Colonoscopy, flexible; with removal of                            tumor(s), polyp(s), or other lesion(s) by snare                            technique Diagnosis Code(s):        --- Professional ---                           Z86.010, Personal history of colonic polyps                           D12.4, Benign neoplasm of descending colon                            D12.3, Benign neoplasm of transverse colon (hepatic                            flexure or splenic flexure)                           K57.30, Diverticulosis of large intestine without                            perforation or abscess without bleeding CPT copyright 2022 American Medical Association. All rights reserved. The codes documented in this report are preliminary and upon coder review may  be revised to meet current compliance requirements. Cristopher Estimable. Jet Traynham, MD Norvel Richards, MD 10/31/2022 8:38:20 AM This report has been signed electronically. Number of Addenda: 0

## 2022-10-31 NOTE — Discharge Instructions (Addendum)
  Colonoscopy Discharge Instructions  Read the instructions outlined below and refer to this sheet in the next few weeks. These discharge instructions provide you with general information on caring for yourself after you leave the hospital. Your doctor may also give you specific instructions. While your treatment has been planned according to the most current medical practices available, unavoidable complications occasionally occur. If you have any problems or questions after discharge, call Dr. Gala Romney at [phone]. ACTIVITY You may resume your regular activity, but move at a slower pace for the next 24 hours.  Take frequent rest periods for the next 24 hours.  Walking will help get rid of the air and reduce the bloated feeling in your belly (abdomen).  No driving for 24 hours (because of the medicine (anesthesia) used during the test).   Do not sign any important legal documents or operate any machinery for 24 hours (because of the anesthesia used during the test).  NUTRITION Drink plenty of fluids.  You may resume your normal diet as instructed by your doctor.  Begin with a light meal and progress to your normal diet. Heavy or fried foods are harder to digest and may make you feel sick to your stomach (nauseated).  Avoid alcoholic beverages for 24 hours or as instructed.  MEDICATIONS You may resume your normal medications unless your doctor tells you otherwise.  WHAT YOU CAN EXPECT TODAY Some feelings of bloating in the abdomen.  Passage of more gas than usual.  Spotting of blood in your stool or on the toilet paper.  IF YOU HAD POLYPS REMOVED DURING THE COLONOSCOPY: No aspirin products for 7 days or as instructed.  No alcohol for 7 days or as instructed.  Eat a soft diet for the next 24 hours.  FINDING OUT THE RESULTS OF YOUR TEST Not all test results are available during your visit. If your test results are not back during the visit, make an appointment with your caregiver to find out the  results. Do not assume everything is normal if you have not heard from your caregiver or the medical facility. It is important for you to follow up on all of your test results.  SEEK IMMEDIATE MEDICAL ATTENTION IF: You have more than a spotting of blood in your stool.  Your belly is swollen (abdominal distention).  You are nauseated or vomiting.  You have a temperature over 101.  You have abdominal pain or discomfort that is severe or gets worse throughout the day.     7 polyps removed from your colon today  Clips placed to prevent bleeding  Colon polyp and diverticulosis information provided  Office visit with Neil Crouch in 6 weeks--Office to call with appointment  No future MRI until clips gone

## 2022-10-31 NOTE — ED Provider Notes (Signed)
[facility] EMERGENCY DEPARTMENT Provider Note   CSN: [id number] Arrival date & time: 10/31/22  1844     History  Chief Complaint  Patient presents with   GI Bleeding    Daniel Crane is a 61 y.o. male.  HPI   Patient presents to the ED with acute GI bleeding.  Patient has history of heart disease hypercholesterolemia, hypertension.  He is not on anticoagulation.  Patient had a colonoscopy performed today.  Patient had several polyps removed.  Patient started to notice bloody stools this evening.  He otherwise was not having trouble with abdominal pain.  No lightheadedness.  Patient called the GI doctor on-call.  Dr. Gala Romney noted that the patient did have a large left colon polyp that had some bleeding post removal.  The stalk was clipped with good hemostasis at that time.  Patient states since speaking to Dr. Gala Romney he has had several more episodes of bloody stools.  He was incontinent of blood.  He has felt lightheaded dizzy and like he is going to pass out  Home Medications Prior to Admission medications   Medication Sig Start Date End Date Taking? Authorizing Provider  amLODipine (NORVASC) 5 MG tablet Take 1 tablet (5 mg total) by mouth daily. 07/12/22   Dettinger, [name], MD  aspirin EC 81 MG tablet Take 81 mg by mouth daily.    [provider]  Cyanocobalamin (VITAMIN B12 PO) Take 1 tablet by mouth daily.    [provider]  diclofenac (VOLTAREN) 75 MG EC tablet Take 1 tablet by mouth twice daily 10/10/22   Dettinger, [name], MD  diclofenac Sodium (VOLTAREN) 1 % GEL Apply 2 g topically 4 (four) times daily. Patient taking differently: Apply 2 g topically daily as needed (pain). 06/25/21   Dettinger, [name], MD  MAGNESIUM PO Take 2 tablets by mouth 2 (two) times daily.    [provider]  Multiple Vitamins-Minerals (MULTIVITAMIN WITH MINERALS) tablet Take 1 tablet by mouth daily.    [provider]  Omega-3 Fatty Acids (FISH OIL PO) Take 1  capsule by mouth daily.    [provider]  rosuvastatin (CRESTOR) 20 MG tablet Take 1 tablet (20 mg total) by mouth daily. Patient taking differently: Take 10 mg by mouth daily. 07/12/22   Dettinger, [name], MD  traZODone (DESYREL) 50 MG tablet Take 1 tablet (50 mg total) by mouth at bedtime. 07/12/22   Dettinger, [name], MD  Turmeric 400 MG CAPS Take 400 mg by mouth 2 (two) times daily.     [provider]  vitamin C (ASCORBIC ACID) 500 MG tablet Take 500 mg by mouth daily.    [provider]  zinc gluconate 50 MG tablet Take 50 mg by mouth daily.    [provider]      Allergies    Patient has no known allergies.    Review of Systems   Review of Systems  Physical Exam Updated Vital Signs BP 136/78   Pulse 76   Temp 97.6 F (36.4 C) (Oral)   Resp 15   SpO2 97%  Physical Exam Vitals and nursing note reviewed.  Constitutional:      Appearance: He is well-developed. He is ill-appearing.  HENT:     Head: Normocephalic and atraumatic.     Right Ear: External ear normal.     Left Ear: External ear normal.  Eyes:     General: No scleral icterus.  Right eye: No discharge.        Left eye: No discharge.     Conjunctiva/sclera: Conjunctivae normal.  Neck:     Trachea: No tracheal deviation.  Cardiovascular:     Rate and Rhythm: Normal rate and regular rhythm.  Pulmonary:     Effort: Pulmonary effort is normal. No respiratory distress.     Breath sounds: Normal breath sounds. No stridor. No wheezing or rales.  Abdominal:     General: Bowel sounds are normal. There is no distension.     Palpations: Abdomen is soft.     Tenderness: There is no abdominal tenderness. There is no guarding or rebound.  Genitourinary:    Comments: Large amount of clotted blood noted coming from the rectum Musculoskeletal:        General: No tenderness or deformity.     Cervical back: Neck supple.  Skin:    General: Skin is warm and dry.     Coloration:  Skin is pale.     Findings: No rash.  Neurological:     General: No focal deficit present.     Mental Status: He is alert.     Cranial Nerves: No cranial nerve deficit (no facial droop, extraocular movements intact, no slurred speech).     Sensory: No sensory deficit.     Motor: No abnormal muscle tone or seizure activity.     Coordination: Coordination normal.  Psychiatric:        Mood and Affect: Mood normal.     ED Results / Procedures / Treatments   Labs (all labs ordered are listed, but only abnormal results are displayed) Labs Reviewed  COMPREHENSIVE METABOLIC PANEL - Abnormal; Notable for the following components:      Result Value   CO2 20 (*)    Glucose, Bld 132 (*)    Creatinine, Ser 1.26 (*)    Calcium 8.8 (*)    AST 66 (*)    ALT 98 (*)    All other components within normal limits  CBC - Abnormal; Notable for the following components:   WBC 12.5 (*)    All other components within normal limits  POC OCCULT BLOOD, ED  I-STAT CHEM 8, ED  TYPE AND SCREEN    EKG None  Radiology No results found.  Procedures Procedures    Medications Ordered in ED Medications  sodium chloride 0.9 % bolus 500 mL (0 mLs Intravenous Stopped 10/31/22 2036)    Followed by  0.9 %  sodium chloride infusion (1,000 mLs Intravenous New Bag/Given 10/31/22 2036)    ED Course/ Medical Decision Making/ A&P Clinical Course as of 10/31/22 2049  Thu Oct 31, 2022  1941 Discussed with Dr Gala Romney.  Will plan on seeing him in the am, colonoscopy in the am.    [JK]  1947 Initial hemoglobin stable [JK]  2047 Comprehensive metabolic panel(!) Creatinine increased compared to previous values [JK]    Clinical Course User Index [JK] Dorie Rank, MD                           Medical Decision Making Problems Addressed: Gastrointestinal hemorrhage associated with anorectal source: acute illness or injury that poses a threat to life or bodily functions  Amount and/or Complexity of Data  Reviewed Labs: ordered. Decision-making details documented in ED Course.  Risk Prescription drug management.   Patient presents ED for evaluation of acute GI bleeding after colonoscopy today.  Patient's initial hemoglobin  is stable.  No indications for transfusion at this time.  Patient's blood pressure is also stabilized.  I discussed the case with Dr. Gala Romney gastroenterology.  Plan will be for repeat colonoscopy in the morning.  Plan on serial hemoglobins.  At this time he remained stable.  I will consult the medical service for admission.        Final Clinical Impression(s) / ED Diagnoses Final diagnoses:  Gastrointestinal hemorrhage associated with anorectal source    Rx / DC Orders ED Discharge Orders     None         Dorie Rank, MD 11/01/22 1514

## 2022-10-31 NOTE — ED Triage Notes (Signed)
Pt had colonoscopy this am. Had had 8 bright red bloody stools since. Pt is pale and diaphoretic. C/o dizziness. A/o.

## 2022-10-31 NOTE — ED Notes (Signed)
Pt given ice water per Dr Donney Dice ok, pt spouse at bedside, both pt and spouse given warm blanket.

## 2022-10-31 NOTE — H&P (Signed)
$'@LOGO'x$ @   Primary Care Physician:  Dettinger, [name], MD Primary Gastroenterologist:  Dr. Gala Romney  Pre-Procedure History & Physical: HPI:  Daniel Crane is a 61 y.o. male here for surveillance colonoscopy.  History of multiple colonic adenomas removed 2016; somewhat overdue for follow-up.  No bowel symptoms at this time.  Past Medical History:  Diagnosis Date   Anginal pain ([place])    history of 11 years ago. No meds at present.   Arthritis    Collagen vascular disease ([place])    Heart disease    catheterization in remote past, medically managed   Hypercholesteremia    Hypertension    Sleep apnea     Past Surgical History:  Procedure Laterality Date   APPENDECTOMY     CARDIAC CATHETERIZATION     CATARACT EXTRACTION W/PHACO Left 04/25/2016   Procedure: CATARACT EXTRACTION PHACO AND INTRAOCULAR LENS PLACEMENT ([place]);  Surgeon: Baruch Goldmann, MD;  Location: [facility];  Service: Ophthalmology;  Laterality: Left;  CDE 3.23   CATARACT EXTRACTION W/PHACO Right 05/23/2016   Procedure: CATARACT EXTRACTION PHACO AND INTRAOCULAR LENS PLACEMENT RIGHT EYE CDE=1.11;  Surgeon: Baruch Goldmann, MD;  Location: [facility];  Service: Ophthalmology;  Laterality: Right;   COLONOSCOPY N/A 10/09/2015   Procedure: COLONOSCOPY;  Surgeon: Daneil Dolin, MD;  Location: [facility] SUITE;  Service: Endoscopy;  Laterality: N/A;  1230 - pt can't come earlier - transportation issues   dental implants      Prior to Admission medications   Medication Sig Start Date End Date Taking? Authorizing Provider  amLODipine (NORVASC) 5 MG tablet Take 1 tablet (5 mg total) by mouth daily. 07/12/22  Yes Dettinger, [name], MD  aspirin EC 81 MG tablet Take 81 mg by mouth daily.   Yes [provider]  Cyanocobalamin (VITAMIN B12 PO) Take 1 tablet by mouth daily.   Yes [provider]  diclofenac (VOLTAREN) 75 MG EC tablet Take 1 tablet by mouth twice daily 10/10/22  Yes Dettinger, [name], MD  MAGNESIUM PO Take 2  tablets by mouth 2 (two) times daily.   Yes [provider]  Multiple Vitamins-Minerals (MULTIVITAMIN WITH MINERALS) tablet Take 1 tablet by mouth daily.   Yes [provider]  Omega-3 Fatty Acids (FISH OIL PO) Take 1 capsule by mouth daily.   Yes [provider]  rosuvastatin (CRESTOR) 20 MG tablet Take 1 tablet (20 mg total) by mouth daily. Patient taking differently: Take 10 mg by mouth daily. 07/12/22  Yes Dettinger, [name], MD  traZODone (DESYREL) 50 MG tablet Take 1 tablet (50 mg total) by mouth at bedtime. 07/12/22  Yes Dettinger, [name], MD  Turmeric 400 MG CAPS Take 400 mg by mouth 2 (two) times daily.    Yes [provider]  vitamin C (ASCORBIC ACID) 500 MG tablet Take 500 mg by mouth daily.   Yes [provider]  zinc gluconate 50 MG tablet Take 50 mg by mouth daily.   Yes [provider]  diclofenac Sodium (VOLTAREN) 1 % GEL Apply 2 g topically 4 (four) times daily. Patient taking differently: Apply 2 g topically daily as needed (pain). 06/25/21   Dettinger, [name], MD    Allergies as of 09/30/2022   (No Known Allergies)    Family History  Problem Relation Age of Onset   Other Mother        Chron's Dz   Other Father        shot by wife, patient's step  mother   Alcoholism Father    Hypertension Father    Other Sister        h/o substance abuse   Thyroid disease Sister    Hypertension Brother    Heart attack Paternal Grandfather    Stroke Brother    Colon cancer Neg Hx     Social History   Socioeconomic History   Marital status: Married    Spouse name: Not on file   Number of children: Not on file   Years of education: Not on file   Highest education level: Not on file  Occupational History   Not on file  Tobacco Use   Smoking status: Former    Types: Pipe, Cigarettes    Start date: 12/15/1978   Smokeless tobacco: Never   Tobacco comments:    quit smoking cigarettes age 38 & started smoking a pipe  when he was in his mid 23's  Vaping Use   Vaping Use: Never used  Substance and Sexual Activity   Alcohol use: Yes    Alcohol/week: 3.0 standard drinks of alcohol    Types: 1 Glasses of wine, 2 Cans of beer per week    Comment: 2-3 times per week    Drug use: No   Sexual activity: Yes    Birth control/protection: None  Other Topics Concern   Not on file  Social History Narrative   Not on file   Social Determinants of Health   Financial Resource Strain: Not on file  Food Insecurity: Not on file  Transportation Needs: Not on file  Physical Activity: Not on file  Stress: Not on file  Social Connections: Not on file  Intimate Partner Violence: Not on file    Review of Systems: See HPI, otherwise negative ROS  Physical Exam: BP (!) 182/98   Pulse 73   Temp 98 F (36.7 C)   Resp 12   Ht '6\' 4"'$  (1.93 m)   Wt (!) 143 kg   SpO2 98%   BMI 38.37 kg/m  General:   Alert,  Well-developed, well-nourished, pleasant and cooperative in NAD Neck:  Supple; no masses or thyromegaly. No significant cervical adenopathy. Lungs:  Clear throughout to auscultation.   No wheezes, crackles, or rhonchi. No acute distress. Heart:  Regular rate and rhythm; no murmurs, clicks, rubs,  or gallops. Abdomen: Non-distended, normal bowel sounds.  Soft and nontender without appreciable mass or hepatosplenomegaly.  Pulses:  Normal pulses noted. Extremities:  Without clubbing or edema.  Impression/Plan: 61 year old gentleman history colonic adenoma; here for surveillance colonoscopy per plan. The risks, benefits, limitations, alternatives and imponderables have been reviewed with the patient. Questions have been answered. All parties are agreeable.       Notice: This dictation was prepared with Dragon dictation along with smaller phrase technology. Any transcriptional errors that result from this process are unintentional and may not be corrected upon review.

## 2022-10-31 NOTE — H&P (Addendum)
History and Physical    Patient: Daniel Crane BOF:[id number] DOB: Jan 11, 1961 DOA: 10/31/2022 DOS: the patient was seen and examined on 11/01/2022 PCP: Dettinger, [name], MD  Patient coming from: Home  Chief Complaint:  Chief Complaint  Patient presents with   GI Bleeding   HPI: Daniel Crane is a 61 y.o. male with medical history significant of hypertension, hyperlipidemia who presents to the emergency department due to GI bleed.  Patient had colonoscopy earlier today during which several polyps were removed.  After the procedure, patient had breakfast with wife and or using the restroom after this, he noted bloody stools and patient states that he had about 7 episodes of blood in the commode after which he called the GI doctor on-call.  During the colonoscopy this morning, Dr. Gala Romney noted the patient had a large left colon polyp which was bleeding after being removed.  The stalk was clipped with good hemostasis during the procedure.  Patient was asked to go to the ED for further evaluation and management and GI will see patient in the morning.  He endorsed being lightheaded and dizzy, patient was noted to be pale on arrival to the ED.  He denies fevers, chills, chest pain, shortness of breath.  ED Course:  In the emergency department, he was hemodynamically stable.  Work-up in the ED showed normal CBC except for WBC of 12.5.  BMP showed sodium 138, potassium 4.2, chloride 106, bicarb 20, glucose 132, BUN 20, creatinine 1.26 (baseline creatinine 0.9-1.0).  AST 66, ALT 98, alkaline phosphatase 56. Colonoscopy Impression: - 6 polyps removed from the transverse, descending and sigmoid segments 4 to 8 mm cold snared. - Diverticulosis in the sigmoid colon and in the descending colon. -Single 1.2 cm likely polyp in his mid sigmoid attempted hot snare but was cold snare due to equipment malfunction. Clip placed.  - Normal hemorrhoids IV hydration was provided.  Hospitalist was asked to admit  patient for further evaluation and management.  Review of Systems: Review of systems as noted in the HPI. All other systems reviewed and are negative.   Past Medical History:  Diagnosis Date   Anginal pain ([place])    history of 11 years ago. No meds at present.   Arthritis    Collagen vascular disease ([place])    Heart disease    catheterization in remote past, medically managed   Hypercholesteremia    Hypertension    Sleep apnea    Past Surgical History:  Procedure Laterality Date   APPENDECTOMY     CARDIAC CATHETERIZATION     CATARACT EXTRACTION W/PHACO Left 04/25/2016   Procedure: CATARACT EXTRACTION PHACO AND INTRAOCULAR LENS PLACEMENT ([place]);  Surgeon: Baruch Goldmann, MD;  Location: [facility];  Service: Ophthalmology;  Laterality: Left;  CDE 3.23   CATARACT EXTRACTION W/PHACO Right 05/23/2016   Procedure: CATARACT EXTRACTION PHACO AND INTRAOCULAR LENS PLACEMENT RIGHT EYE CDE=1.11;  Surgeon: Baruch Goldmann, MD;  Location: [facility];  Service: Ophthalmology;  Laterality: Right;   COLONOSCOPY N/A 10/09/2015   Procedure: COLONOSCOPY;  Surgeon: Daneil Dolin, MD;  Location: [facility];  Service: Endoscopy;  Laterality: N/A;  1230 - pt can't come earlier - transportation issues   dental implants      Social History:  reports that he has quit smoking. His smoking use included pipe and cigarettes. He started smoking about 43 years ago. He has never used smokeless tobacco. He reports current alcohol use of about 3.0 standard drinks of alcohol per week.  He reports that he does not use drugs.   No Known Allergies  Family History  Problem Relation Age of Onset   Other Mother        Chron's Dz   Other Father        shot by wife, patient's step mother   Alcoholism Father    Hypertension Father    Other Sister        h/o substance abuse   Thyroid disease Sister    Hypertension Brother    Heart attack Paternal Grandfather    Stroke Brother    Colon cancer Neg Hx       Prior to  Admission medications   Medication Sig Start Date End Date Taking? Authorizing Provider  amLODipine (NORVASC) 5 MG tablet Take 1 tablet (5 mg total) by mouth daily. 07/12/22  Yes Dettinger, [name], MD  aspirin EC 81 MG tablet Take 81 mg by mouth daily.   Yes [provider]  Cyanocobalamin (VITAMIN B12 PO) Take 1 tablet by mouth daily.   Yes [provider]  cyclobenzaprine (FLEXERIL) 10 MG tablet Take 10 mg by mouth at bedtime.   Yes [provider]  diclofenac (VOLTAREN) 75 MG EC tablet Take 1 tablet by mouth twice daily 10/10/22  Yes Dettinger, [name], MD  diclofenac Sodium (VOLTAREN) 1 % GEL Apply 2 g topically 4 (four) times daily. Patient taking differently: Apply 2 g topically daily as needed (pain). 06/25/21  Yes Dettinger, [name], MD  MAGNESIUM PO Take 1 tablet by mouth See admin instructions. Take 1 tablet in the morning and 2 tablets every evening   Yes [provider]  Milk Thistle 1000 MG CAPS Take 1 tablet by mouth every evening.   Yes [provider]  Multiple Vitamins-Minerals (MULTIVITAMIN WITH MINERALS) tablet Take 1 tablet by mouth daily.   Yes [provider]  Omega-3 Fatty Acids (FISH OIL PO) Take 1 capsule by mouth daily.   Yes [provider]  rosuvastatin (CRESTOR) 20 MG tablet Take 1 tablet (20 mg total) by mouth daily. Patient taking differently: Take 10 mg by mouth daily. 07/12/22  Yes Dettinger, [name], MD  traZODone (DESYREL) 50 MG tablet Take 1 tablet (50 mg total) by mouth at bedtime. 07/12/22  Yes Dettinger, [name], MD  Turmeric 400 MG CAPS Take 400 mg by mouth 2 (two) times daily.    Yes [provider]  vitamin C (ASCORBIC ACID) 500 MG tablet Take 500 mg by mouth daily.   Yes [provider]  zinc gluconate 50 MG tablet Take 50 mg by mouth daily.   Yes [provider]    Physical Exam: BP 137/71   Pulse 71   Temp 97.8 F (36.6 C) (Oral)   Resp 19   Ht '6\' 4"'$   (1.93 m)   Wt 135.5 kg   SpO2 97%   BMI 36.36 kg/m   General: 61 y.o. year-old male ill appearing, but in no acute distress.  Alert and oriented x3. HEENT: NCAT, EOMI Neck: Supple, trachea medial Cardiovascular: Regular rate and rhythm with no rubs or gallops.  No thyromegaly or JVD noted.  No lower extremity edema. 2/4 pulses in all 4 extremities. Respiratory: Clear to auscultation with no wheezes or rales. Good inspiratory effort. Abdomen: Soft, nontender nondistended with normal bowel sounds x4 quadrants. Muskuloskeletal: No cyanosis, clubbing or edema noted bilaterally Neuro: CN II-XII intact, sensation, reflexes intact Skin: Pale.  No ulcerative lesions noted or rashes Psychiatry:  Judgement and insight appear normal. Mood is appropriate for condition and setting          Labs on Admission:  Basic Metabolic Panel: Recent Labs  Lab 10/31/22 1936  NA 138  K 4.2  CL 106  CO2 20*  GLUCOSE 132*  BUN 20  CREATININE 1.26*  CALCIUM 8.8*   Liver Function Tests: Recent Labs  Lab 10/31/22 1936  AST 66*  ALT 98*  ALKPHOS 56  BILITOT 0.5  PROT 6.9  ALBUMIN 3.8   No results for input(s): "LIPASE", "AMYLASE" in the last 168 hours. No results for input(s): "AMMONIA" in the last 168 hours. CBC: Recent Labs  Lab 10/31/22 1936  WBC 12.5*  HGB 14.3  HCT 41.1  MCV 94.3  PLT 319   Cardiac Enzymes: No results for input(s): "CKTOTAL", "CKMB", "CKMBINDEX", "TROPONINI" in the last 168 hours.  BNP (last 3 results) No results for input(s): "BNP" in the last 8760 hours.  ProBNP (last 3 results) No results for input(s): "PROBNP" in the last 8760 hours.  CBG: No results for input(s): "GLUCAP" in the last 168 hours.  Radiological Exams on Admission: No results found.  EKG: I independently viewed the EKG done and my findings are as followed: EKG was not done in the ED  Assessment/Plan Present on Admission:  GI bleed  Essential hypertension  Mixed  hyperlipidemia  Principal Problem:   GI bleed Active Problems:   Mixed hyperlipidemia   Essential hypertension   Leukocytosis  GI bleed H/H= 14.3/41.1, this was 15.9/46.7 about 2 months ago Large amount of clotted blood noted coming from patient's rectum per ED medical record Continue IV Protonix  Gastroenterologist  will be consulted in the morning  Leukocytosis possibly reactive WBC 12.5, continue to monitor WBC with morning labs  Essential hypertension Continue amlodipine  Mixed hyperlipidemia Continue Crestor  Transaminitis AST 66, ALT 98, patient denies any abdominal pain Continue to monitor liver enzymes with morning labs   DVT prophylaxis: SCDs  Code Status: Full code  Consults: Gastroenterology  Family Communication: Wife at bedside (all questions answered to satisfaction  Severity of Illness: The appropriate patient status for this patient is OBSERVATION. Observation status is judged to be reasonable and necessary in order to provide the required intensity of service to ensure the patient's safety. The patient's presenting symptoms, physical exam findings, and initial radiographic and laboratory data in the context of their medical condition is felt to place them at decreased risk for further clinical deterioration. Furthermore, it is anticipated that the patient will be medically stable for discharge from the hospital within 2 midnights of admission.   Author: Bernadette Hoit, DO 11/01/2022 3:25 AM  For on call review [email or web address].

## 2022-10-31 NOTE — Telephone Encounter (Signed)
Spoke with Dr. Gala Romney and he advised me to instruct the pt to proceed to the ER if bleeding does not begin to taper off. Pt is to also hold aspirin for the next five days and continue a clear liquid diet over night. Will reach out to patient in the morning if pt does not proceed to the ER over night. Pt verbalized understanding.

## 2022-10-31 NOTE — Telephone Encounter (Signed)
Pt called stating that he had a colonoscopy today and states that he had clips put in and that he has been to the bathroom three times and all three times the toilet was full and bright red. Pt is not having any abdominal pain. Pt is wanting to know if this is normal or if he should be concerned. Pt is requesting that we return his call at [phone]. Please advise.

## 2022-10-31 NOTE — Anesthesia Postprocedure Evaluation (Signed)
Anesthesia Post Note  Patient: Daniel Crane  Procedure(s) Performed: COLONOSCOPY WITH PROPOFOL POLYPECTOMY HEMOSTASIS CLIP PLACEMENT  Patient location during evaluation: Phase II Anesthesia Type: General Level of consciousness: awake and alert and oriented Pain management: pain level controlled Vital Signs Assessment: post-procedure vital signs reviewed and stable Respiratory status: spontaneous breathing, nonlabored ventilation and respiratory function stable Cardiovascular status: blood pressure returned to baseline and stable Postop Assessment: no apparent nausea or vomiting Anesthetic complications: no   No notable events documented.   Last Vitals:  Vitals:   10/31/22 0645 10/31/22 0825  BP: (!) 182/98 100/65  Pulse: 73 78  Resp: 12 16  Temp: 36.7 C 36.9 C  SpO2: 98%     Last Pain:  Vitals:   10/31/22 0825  TempSrc: Oral  PainSc: 0-No pain                 Annie Saephan C Latiesha Harada

## 2022-10-31 NOTE — Transfer of Care (Signed)
Immediate Anesthesia Transfer of Care Note  Patient: Daniel Crane  Procedure(s) Performed: COLONOSCOPY WITH PROPOFOL POLYPECTOMY HEMOSTASIS CLIP PLACEMENT  Patient Location: PACU and Short Stay  Anesthesia Type:General  Level of Consciousness: awake, alert , oriented, and patient cooperative  Airway & Oxygen Therapy: Patient Spontanous Breathing  Post-op Assessment: Report given to RN, Post -op Vital signs reviewed and stable, and Patient moving all extremities X 4  Post vital signs: Reviewed and stable  Last Vitals:  Vitals Value Taken Time  BP 100/65 10/31/22 0825  Temp 36.9 C 10/31/22 0825  Pulse 78 10/31/22 0825  Resp 16 10/31/22 0825  SpO2      Last Pain:  Vitals:   10/31/22 0825  TempSrc: Oral  PainSc: 0-No pain         Complications: No notable events documented.

## 2022-11-01 ENCOUNTER — Encounter (HOSPITAL_COMMUNITY)
Admission: EM | Disposition: A | Discharge status: Home / Self Care | Payer: Self-pay | Source: Home / Self Care | Attending: Emergency Medicine

## 2022-11-01 ENCOUNTER — Observation Stay (HOSPITAL_COMMUNITY): Payer: No Typology Code available for payment source | Admitting: Anesthesiology

## 2022-11-01 ENCOUNTER — Observation Stay (HOSPITAL_BASED_OUTPATIENT_CLINIC_OR_DEPARTMENT_OTHER): Payer: No Typology Code available for payment source | Admitting: Anesthesiology

## 2022-11-01 ENCOUNTER — Encounter (HOSPITAL_COMMUNITY): Payer: Self-pay | Admitting: Internal Medicine

## 2022-11-01 DIAGNOSIS — D72829 Elevated white blood cell count, unspecified: Secondary | ICD-10-CM | POA: Insufficient documentation

## 2022-11-01 DIAGNOSIS — D62 Acute posthemorrhagic anemia: Secondary | ICD-10-CM | POA: Diagnosis not present

## 2022-11-01 DIAGNOSIS — I10 Essential (primary) hypertension: Secondary | ICD-10-CM | POA: Diagnosis not present

## 2022-11-01 DIAGNOSIS — K922 Gastrointestinal hemorrhage, unspecified: Secondary | ICD-10-CM | POA: Diagnosis not present

## 2022-11-01 DIAGNOSIS — T184XXA Foreign body in colon, initial encounter: Secondary | ICD-10-CM | POA: Diagnosis not present

## 2022-11-01 DIAGNOSIS — K635 Polyp of colon: Secondary | ICD-10-CM | POA: Diagnosis not present

## 2022-11-01 DIAGNOSIS — D125 Benign neoplasm of sigmoid colon: Secondary | ICD-10-CM | POA: Diagnosis not present

## 2022-11-01 DIAGNOSIS — D124 Benign neoplasm of descending colon: Secondary | ICD-10-CM | POA: Diagnosis not present

## 2022-11-01 DIAGNOSIS — K760 Fatty (change of) liver, not elsewhere classified: Secondary | ICD-10-CM | POA: Diagnosis present

## 2022-11-01 DIAGNOSIS — K625 Hemorrhage of anus and rectum: Secondary | ICD-10-CM

## 2022-11-01 DIAGNOSIS — Z87891 Personal history of nicotine dependence: Secondary | ICD-10-CM

## 2022-11-01 HISTORY — PX: COLONOSCOPY WITH PROPOFOL: SHX5780

## 2022-11-01 LAB — BASIC METABOLIC PANEL
Anion gap: 7 (ref 5–15)
BUN: 18 mg/dL (ref 6–20)
CO2: 23 mmol/L (ref 22–32)
Calcium: 8.2 mg/dL — ABNORMAL LOW (ref 8.9–10.3)
Chloride: 107 mmol/L (ref 98–111)
Creatinine, Ser: 0.82 mg/dL (ref 0.61–1.24)
GFR, Estimated: >60 mL/min (ref >60)
Glucose, Bld: 110 mg/dL — ABNORMAL HIGH (ref 70–99)
Potassium: 3.6 mmol/L (ref 3.5–5.1)
Sodium: 137 mmol/L (ref 135–145)

## 2022-11-01 LAB — HEMOGLOBIN AND HEMATOCRIT, BLOOD
HCT: 35 % — ABNORMAL LOW (ref 39.0–52.0)
Hemoglobin: 12 g/dL — ABNORMAL LOW (ref 13.0–17.0)

## 2022-11-01 LAB — HEPATITIS PANEL, ACUTE
HCV Ab: NONREACTIVE
Hep A IgM: NONREACTIVE
Hep B C IgM: NONREACTIVE
Hepatitis B Surface Ag: NONREACTIVE

## 2022-11-01 LAB — CBC
HCT: 35.3 % — ABNORMAL LOW (ref 39.0–52.0)
Hemoglobin: 11.9 g/dL — ABNORMAL LOW (ref 13.0–17.0)
MCH: 32.3 pg (ref 26.0–34.0)
MCHC: 33.7 g/dL (ref 30.0–36.0)
MCV: 95.9 fL (ref 80.0–100.0)
Platelets: 253 10*3/uL (ref 150–400)
RBC: 3.68 MIL/uL — ABNORMAL LOW (ref 4.22–5.81)
RDW: 13.6 % (ref 11.5–15.5)
WBC: 10.7 10*3/uL — ABNORMAL HIGH (ref 4.0–10.5)
nRBC: 0 % (ref 0.0–0.2)

## 2022-11-01 LAB — SURGICAL PATHOLOGY

## 2022-11-01 LAB — PHOSPHORUS: Phosphorus: 3.5 mg/dL (ref 2.5–4.6)

## 2022-11-01 LAB — MAGNESIUM: Magnesium: 2 mg/dL (ref 1.7–2.4)

## 2022-11-01 SURGERY — COLONOSCOPY WITH PROPOFOL
Anesthesia: General

## 2022-11-01 MED ORDER — PEG 3350-KCL-NA BICARB-NACL 420 G PO SOLR
2000.0000 mL | ORAL | Status: AC
  Administered 2022-11-01: 2000 mL via ORAL

## 2022-11-01 MED ORDER — ROSUVASTATIN CALCIUM 20 MG PO TABS
20.0000 mg | ORAL_TABLET | Freq: Every day | ORAL | Status: DC
  Administered 2022-11-01 – 2022-11-02 (×2): 20 mg via ORAL
  Filled 2022-11-01 (×2): qty 1

## 2022-11-01 MED ORDER — LIDOCAINE HCL 1 % IJ SOLN
INTRAMUSCULAR | Status: DC | PRN
  Administered 2022-11-01: 50 mg via INTRADERMAL

## 2022-11-01 MED ORDER — PROPOFOL 500 MG/50ML IV EMUL
INTRAVENOUS | Status: DC | PRN
  Administered 2022-11-01: 150 ug/kg/min via INTRAVENOUS

## 2022-11-01 MED ORDER — AMLODIPINE BESYLATE 5 MG PO TABS
5.0000 mg | ORAL_TABLET | Freq: Every day | ORAL | Status: DC
  Administered 2022-11-01 – 2022-11-02 (×2): 5 mg via ORAL
  Filled 2022-11-01 (×2): qty 1

## 2022-11-01 MED ORDER — LACTATED RINGERS IV SOLN: INTRAVENOUS | Status: DC | PRN

## 2022-11-01 MED ORDER — PANTOPRAZOLE SODIUM 40 MG IV SOLR
40.0000 mg | Freq: Two times a day (BID) | INTRAVENOUS | Status: DC
  Administered 2022-11-01 – 2022-11-02 (×3): 40 mg via INTRAVENOUS
  Filled 2022-11-01 (×3): qty 10

## 2022-11-01 MED ORDER — LACTATED RINGERS IV SOLN: INTRAVENOUS | Status: DC

## 2022-11-01 MED ORDER — PROPOFOL 10 MG/ML IV BOLUS
INTRAVENOUS | Status: DC | PRN
  Administered 2022-11-01 (×2): 50 mg via INTRAVENOUS
  Administered 2022-11-01: 30 mg via INTRAVENOUS
  Administered 2022-11-01: 100 mg via INTRAVENOUS

## 2022-11-01 MED ORDER — SODIUM CHLORIDE 0.9 % IV SOLN: INTRAVENOUS | Status: DC

## 2022-11-01 NOTE — Anesthesia Postprocedure Evaluation (Signed)
Anesthesia Post Note  Patient: Daniel Crane  Procedure(s) Performed: COLONOSCOPY WITH PROPOFOL  Patient location during evaluation: PACU Anesthesia Type: General Level of consciousness: awake and alert Pain management: pain level controlled Vital Signs Assessment: post-procedure vital signs reviewed and stable Respiratory status: spontaneous breathing Cardiovascular status: blood pressure returned to baseline and stable Postop Assessment: no apparent nausea or vomiting Anesthetic complications: no   No notable events documented.   Last Vitals:  Vitals:   11/01/22 1503 11/01/22 1553  BP: (!) 155/89 (!) 86/65  Pulse: 79 73  Resp:  15  Temp:  (!) 36.4 C  SpO2: 100% 98%    Last Pain:  Vitals:   11/01/22 1553  TempSrc:   PainSc: Asleep                 Shawntae Lowy

## 2022-11-01 NOTE — Transfer of Care (Signed)
Immediate Anesthesia Transfer of Care Note  Patient: Daniel Crane  Procedure(s) Performed: COLONOSCOPY WITH PROPOFOL  Patient Location: PACU  Anesthesia Type:General  Level of Consciousness: awake  Airway & Oxygen Therapy: Patient Spontanous Breathing  Post-op Assessment: Report given to RN  Post vital signs: Reviewed  Last Vitals:  Vitals Value Taken Time  BP 100/70 11/01/22 1555  Temp 36.4 C 11/01/22 1553  Pulse 65 11/01/22 1556  Resp 14 11/01/22 1556  SpO2 99 % 11/01/22 1556  Vitals shown include unvalidated device data.  Last Pain:  Vitals:   11/01/22 1553  TempSrc:   PainSc: Asleep         Complications: No notable events documented.

## 2022-11-01 NOTE — Consult Note (Signed)
Gastroenterology Consult   Referring Provider: No ref. provider found Primary Care Physician:  Dettinger, [name], MD Primary Gastroenterologist:  Cristopher Estimable.Rourk, MD   Patient ID: BIRD SWETZ; [id number]; January 27, 1961   Admit date: 10/31/2022  LOS: 0 days   Date of Consultation: 11/01/2022  Reason for Consultation:  post polypectomy GI bleed  History of Present Illness   Daniel Crane is a 61 y.o. year old male with history of HTN and HLD who presented to the ED at the recommendation of Dr. Gala Romney given multiple episodes of rectal bleeding post colonoscopy yesterday morning.  GI consulted for further management of post polypectomy bleeding and symptomatic anemia.  ED course: Hemodynamically stable, did have syncopal episode Hemoglobin 14.1, WBC 12.5, MCV 94.3, elevated blood glucose at 110, normal LFTs and renal function, magnesium and phosphorus within normal limits. IV hydration ordered.  Admitted for observation   Consult: Colonoscopy 10/31/22:  -6 polyps removed from the transverse, descending, sigmoid colon (4-8 mm) -Sigmoid and descending diverticulosis -Single 1.2 cm polyp in the mid sigmoid (hot snare attempted but was cold snared due to equipment malfunction and clip placed x2 to the site with hemostasis) -Hemorrhoids  Had breakfast yesterday around 10 am after his procedure yesterday morning and then began having BRBPR. Reported about 7 BM from 10am to 5pm and then had syncopal episode in the ED followed by 3 more episodes of BRBPR that caused him to have his pants cut off of him. Denies abdominal pain, nausea, or vomiting. Only had clear liquids yesterday afternoon and nothing since being in the ED. He has expressed his concerns regarding equipment malfunction mentioned in his TCS report. Currently not feeling any dizziness, lightheadedness, chest pain, shortness of breath, or restless leg but has not been out of bed since he arrived at the hospital.    Past Medical  History:  Diagnosis Date   Anginal pain ([place])    history of 11 years ago. No meds at present.   Arthritis    Collagen vascular disease ([place])    Heart disease    catheterization in remote past, medically managed   Hypercholesteremia    Hypertension    Sleep apnea     Past Surgical History:  Procedure Laterality Date   APPENDECTOMY     CARDIAC CATHETERIZATION     CATARACT EXTRACTION W/PHACO Left 04/25/2016   Procedure: CATARACT EXTRACTION PHACO AND INTRAOCULAR LENS PLACEMENT ([place]);  Surgeon: Baruch Goldmann, MD;  Location: [facility];  Service: Ophthalmology;  Laterality: Left;  CDE 3.23   CATARACT EXTRACTION W/PHACO Right 05/23/2016   Procedure: CATARACT EXTRACTION PHACO AND INTRAOCULAR LENS PLACEMENT RIGHT EYE CDE=1.11;  Surgeon: Baruch Goldmann, MD;  Location: [facility];  Service: Ophthalmology;  Laterality: Right;   COLONOSCOPY N/A 10/09/2015   Procedure: COLONOSCOPY;  Surgeon: Daneil Dolin, MD;  Location: [facility];  Service: Endoscopy;  Laterality: N/A;  1230 - pt can't come earlier - transportation issues   dental implants      Prior to Admission medications   Medication Sig Start Date End Date Taking? Authorizing Provider  amLODipine (NORVASC) 5 MG tablet Take 1 tablet (5 mg total) by mouth daily. 07/12/22  Yes Dettinger, [name], MD  aspirin EC 81 MG tablet Take 81 mg by mouth daily.   Yes [provider]  Cyanocobalamin (VITAMIN B12 PO) Take 1 tablet by mouth daily.   Yes [provider]  cyclobenzaprine (FLEXERIL) 10 MG tablet Take 10 mg by mouth at bedtime.  Yes [provider]  diclofenac (VOLTAREN) 75 MG EC tablet Take 1 tablet by mouth twice daily 10/10/22  Yes Dettinger, [name], MD  diclofenac Sodium (VOLTAREN) 1 % GEL Apply 2 g topically 4 (four) times daily. Patient taking differently: Apply 2 g topically daily as needed (pain). 06/25/21  Yes Dettinger, [name], MD  MAGNESIUM PO Take 1 tablet by mouth See admin instructions. Take 1 tablet in  the morning and 2 tablets every evening   Yes [provider]  Milk Thistle 1000 MG CAPS Take 1 tablet by mouth every evening.   Yes [provider]  Multiple Vitamins-Minerals (MULTIVITAMIN WITH MINERALS) tablet Take 1 tablet by mouth daily.   Yes [provider]  Omega-3 Fatty Acids (FISH OIL PO) Take 1 capsule by mouth daily.   Yes [provider]  rosuvastatin (CRESTOR) 20 MG tablet Take 1 tablet (20 mg total) by mouth daily. Patient taking differently: Take 10 mg by mouth daily. 07/12/22  Yes Dettinger, [name], MD  traZODone (DESYREL) 50 MG tablet Take 1 tablet (50 mg total) by mouth at bedtime. 07/12/22  Yes Dettinger, [name], MD  Turmeric 400 MG CAPS Take 400 mg by mouth 2 (two) times daily.    Yes [provider]  vitamin C (ASCORBIC ACID) 500 MG tablet Take 500 mg by mouth daily.   Yes [provider]  zinc gluconate 50 MG tablet Take 50 mg by mouth daily.   Yes [provider]    Current Facility-Administered Medications  Medication Dose Route Frequency Provider Last Rate Last Admin   0.9 %  sodium chloride infusion  1,000 mL Intravenous Continuous Adefeso, Oladapo, DO   Stopped at 10/31/22 2304   amLODipine (NORVASC) tablet 5 mg  5 mg Oral Daily Adefeso, Oladapo, DO   5 mg at 11/01/22 0846   Chlorhexidine Gluconate Cloth 2 % PADS 6 each  6 each Topical Daily Adefeso, Oladapo, DO   6 each at 11/01/22 0846   pantoprazole (PROTONIX) injection 40 mg  40 mg Intravenous Q12H Adefeso, Oladapo, DO   40 mg at 11/01/22 0847   rosuvastatin (CRESTOR) tablet 20 mg  20 mg Oral Daily Adefeso, Oladapo, DO   20 mg at 11/01/22 0846    Allergies as of 10/31/2022   (No Known Allergies)    Family History  Problem Relation Age of Onset   Other Mother        Chron's Dz   Other Father        shot by wife, patient's step mother   Alcoholism Father    Hypertension Father    Other Sister        h/o substance abuse   Thyroid disease  Sister    Hypertension Brother    Heart attack Paternal Grandfather    Stroke Brother    Colon cancer Neg Hx     Social History   Socioeconomic History   Marital status: Married    Spouse name: Not on file   Number of children: Not on file   Years of education: Not on file   Highest education level: Not on file  Occupational History   Not on file  Tobacco Use   Smoking status: Former    Types: Pipe, Cigarettes    Start date: 12/15/1978   Smokeless tobacco: Never   Tobacco comments:    quit smoking cigarettes age 36 & started smoking a pipe when he was in his mid 6's  Vaping Use  Vaping Use: Never used  Substance and Sexual Activity   Alcohol use: Yes    Alcohol/week: 3.0 standard drinks of alcohol    Types: 1 Glasses of wine, 2 Cans of beer per week    Comment: 2-3 times per week    Drug use: No   Sexual activity: Yes    Birth control/protection: None  Other Topics Concern   Not on file  Social History Narrative   Not on file   Social Determinants of Health   Financial Resource Strain: Not on file  Food Insecurity: No Food Insecurity (11/01/2022)   Hunger Vital Sign    Worried About Running Out of Food in the Last Year: Never true    Ran Out of Food in the Last Year: Never true  Transportation Needs: No Transportation Needs (11/01/2022)   PRAPARE - Hydrologist (Medical): No    Lack of Transportation (Non-Medical): No  Physical Activity: Not on file  Stress: Not on file  Social Connections: Not on file  Intimate Partner Violence: Not At Risk (11/01/2022)   Humiliation, Afraid, Rape, and Kick questionnaire    Fear of Current or Ex-Partner: No    Emotionally Abused: No    Physically Abused: No    Sexually Abused: No     Review of Systems   Gen: Denies any fever, chills, loss of appetite, change in weight or weight loss CV: Denies chest pain, heart palpitations, syncope, edema  Resp: Denies shortness of breath with rest, cough,  wheezing, coughing up blood, and pleurisy. GI: see HPI GU : Denies urinary burning, blood in urine, urinary frequency, and urinary incontinence. MS: Denies joint pain, limitation of movement, swelling, cramps, and atrophy.  Derm: Denies rash, itching, dry skin, hives. Psych: Denies depression, anxiety, memory loss, hallucinations, and confusion. Heme: Denies bruising or bleeding Neuro:  Denies any headaches, dizziness, paresthesias, shaking  Physical Exam   Vital Signs in last 24 hours: Temp:  [97.6 F (36.4 C)-98.4 F (36.9 C)] 97.8 F (36.6 C) (11/03 0000) Pulse Rate:  [64-93] 64 (11/03 0800) Resp:  [12-19] 16 (11/03 0800) BP: (104-146)/(61-85) 117/62 (11/03 0800) SpO2:  [94 %-100 %] 95 % (11/03 0800) Weight:  [135.5 kg] 135.5 kg (11/03 0000) Last BM Date : 10/31/22  General:   Alert,  Well-developed, well-nourished, pleasant and cooperative in NAD Head:  Normocephalic and atraumatic. Eyes:  Sclera clear, no icterus.   Conjunctiva pink. Ears:  Normal auditory acuity. Neck:  Supple; no masses Abdomen:  Soft, nontender and nondistended. No masses, hepatosplenomegaly or hernias noted. Normal bowel sounds, without guarding, and without rebound.   Rectal: deferred   Neurologic:  Alert and  oriented x4. Skin:  Intact without significant lesions or rashes. Psych:  Alert and cooperative. Normal mood and affect.  Intake/Output from previous day: 11/02 0701 - 11/03 0700 In: 1000 [I.V.:500; IV Piggyback:500] Out: -  Intake/Output this shift: Total I/O In: -  Out: 625 [Urine:625]   Labs/Studies   Recent Labs Recent Labs    10/31/22 1936 11/01/22 0621  WBC 12.5* 10.7*  HGB 14.3 11.9*  HCT 41.1 35.3*  PLT 319 253   BMET Recent Labs    10/31/22 1936 11/01/22 0621  NA 138 137  K 4.2 3.6  CL 106 107  CO2 20* 23  GLUCOSE 132* 110*  BUN 20 18  CREATININE 1.26* 0.82  CALCIUM 8.8* 8.2*   LFT Recent Labs    10/31/22 1936  PROT 6.9  ALBUMIN  3.8  AST 66*  ALT  98*  ALKPHOS 56  BILITOT 0.5   PT/INR No results for input(s): "LABPROT", "INR" in the last 72 hours. Hepatitis Panel No results for input(s): "HEPBSAG", "HCVAB", "HEPAIGM", "HEPBIGM" in the last 72 hours. C-Diff No results for input(s): "CDIFFTOX" in the last 72 hours.  Radiology/Studies No results found.   Assessment   WING SCHOCH is a 61 y.o. year old male with history of HTN and HLD who presented to the ED at the recommendation of Dr. Gala Romney given multiple episodes of rectal bleeding post colonoscopy yesterday morning.  GI consulted for further management of post polypectomy bleeding and symptomatic anemia.  Post polypectomy GI bleed, anemia: Colonoscopy performed yesterday as outpatient with removal of 6 polyps from the transverse, descending, and sigmoid ranging various sizes, diverticulosis in the sigmoid and descending colon.  He had a larger pedunculated 1.2 cm polyp with a long stalk that was attempted to be removed via hot snare but ended up Rehman cold snare due to equipment malfunction.  There was minimal bleeding and clip placed x2 to site with immediate hemostasis.  After leaving procedure patient proceeded to have breakfast around 10 AM and shortly after he began having multiple episodes of bright red rectal bleeding without stool.  He contacted on-call GI physician who recommended ED evaluation and observation.  He did experience a syncopal episode in the ED with 3 more episodes of rectal bleeding but has not had any more since admission to his hospital room.  Given his drop in hemoglobin from 14.1 to 11.9 this morning we will further evaluate with repeat colonoscopy today with Dr. Jenetta Downer, he will be drinking his prep from around 10 AM until noon and then be n.p.o. with plan for procedure around 3 PM.   Plan / Recommendations   Half colon prep from 10am-noon NPO at noon Colonoscopy today with Dr. Jenetta Downer Further recommendations to follow     11/01/2022, 8:59  AM  Venetia Night, MSN, FNP-BC, AGACNP-BC [facility]

## 2022-11-01 NOTE — Progress Notes (Signed)
  Transition of Care [facility]) Screening Note   Patient Details  Name: Daniel Crane Date of Birth: Mar 18, 1961   Transition of Care [facility]) CM/SW Contact:    Ihor Gully, LCSW Phone Number: 11/01/2022, 4:05 PM    Transition of Care Department [facility]) has reviewed patient and no TOC needs have been identified at this time. We will continue to monitor patient advancement through interdisciplinary progression rounds. If new patient transition needs arise, please place a TOC consult.

## 2022-11-01 NOTE — Anesthesia Preprocedure Evaluation (Addendum)
Anesthesia Evaluation  Patient identified by MRN, date of birth, ID band Patient awake    Reviewed: Allergy & Precautions, NPO status , Patient's Chart, lab work & pertinent test results  History of Anesthesia Complications Negative for: history of anesthetic complications  Airway Mallampati: II  TM Distance: >3 FB Neck ROM: Full    Dental  (+) Dental Advisory Given, Missing, Chipped   Pulmonary shortness of breath and with exertion, sleep apnea , former smoker   Pulmonary exam normal breath sounds clear to auscultation       Cardiovascular hypertension, Pt. on medications + angina  Normal cardiovascular exam Rhythm:Regular Rate:Normal     Neuro/Psych    GI/Hepatic Neg liver ROS, Bowel prep,,,Fatty liver Post colonoscopy lower GI bleeding   Endo/Other  negative endocrine ROS    Renal/GU negative Renal ROS     Musculoskeletal  (+) Arthritis , Osteoarthritis,    Abdominal   Peds  Hematology negative hematology ROS (+)   Anesthesia Other Findings   Reproductive/Obstetrics                             Anesthesia Physical Anesthesia Plan  ASA: 3  Anesthesia Plan: General   Post-op Pain Management: Minimal or no pain anticipated   Induction: Intravenous  PONV Risk Score and Plan: Propofol infusion  Airway Management Planned: Nasal Cannula and Natural Airway  Additional Equipment:   Intra-op Plan:   Post-operative Plan:   Informed Consent: I have reviewed the patients History and Physical, chart, labs and discussed the procedure including the risks, benefits and alternatives for the proposed anesthesia with the patient or authorized representative who has indicated his/her understanding and acceptance.     Dental advisory given  Plan Discussed with: CRNA and Surgeon  Anesthesia Plan Comments:         Anesthesia Quick Evaluation

## 2022-11-01 NOTE — Progress Notes (Addendum)
PROGRESS NOTE  Daniel Crane, is a 61 y.o. male, DOB - [birth year], JSE:[id number]  Admit date - 10/31/2022   Admitting Physician Bernadette Hoit, DO  Outpatient Primary MD for the patient is Dettinger, [name], MD  LOS - 0  Chief Complaint  Patient presents with   GI Bleeding       Brief Narrative:  a 61 y.o. male with medical history significant of hypertension, hyperlipidemia and obesity admitted on 10/31/2022 with rectal bleeding after colonoscopy with polypectomy on 10/31/2022    -Assessment and Plan: 1) acute GI bleed----patient had colonoscopy with polypectomy on 10/31/2022,  Dr. Gala Romney noted the patient had a large left colon polyp during colonoscopy on 10/31/2022, apparently there was bleeding after polypectomy,  The stalk of the resected polyp was clipped with good hemostasis during the colonoscopy procedure. -When patient got home postprocedure he had bright red blood per the rectum with large amount of clotted blood -GI plans for repeat colonoscopy on 11/01/2022 to try to achieve hemostasis -  2) acute symptomatic anemia due to acute blood loss as above #1 -Hgb is down to 11.9 from a baseline usually around 15 -Monitor Hgb and transfuse as clinically indicated  3)Transaminitis/hepatic steatosis --- ALT 98 AST 66 -Review of record reveals persistently mildly elevated LFTs usually with ALT being higher -Check viral hepatitis profile  4)HTN--stable, continue amlodipine 5 mg daily  5)HLD--- continue Crestor, LFT elevation not significant enough to hold Crestor at this time  6)Morbid Obesity- -Low calorie diet, portion control and increase physical activity discussed with patient -Body mass index is 36.36 kg/m.  Disposition/Need for in-Hospital Stay- patient unable to be discharged at this time due to --postpolypectomy rectal bleeding requiring endoluminal evaluation and control of bleeding*  Status is: Inpatient   Disposition: The patient is from: Home               Anticipated d/c is to: Home              Anticipated d/c date is: 1 day              Patient currently is not medically stable to d/c. Barriers: Not Clinically Stable-   Code Status : -  Code Status: Full Code   Family Communication:  NA (patient is alert, awake and coherent)  DVT Prophylaxis  :   - SCDs /GI bleed    Lab Results  Component Value Date   PLT 253 11/01/2022    Inpatient Medications  Scheduled Meds:  amLODipine  5 mg Oral Daily   Chlorhexidine Gluconate Cloth  6 each Topical Daily   pantoprazole (PROTONIX) IV  40 mg Intravenous Q12H   rosuvastatin  20 mg Oral Daily   Continuous Infusions:  sodium chloride Stopped (10/31/22 2304)   PRN Meds:.  Anti-infectives (From admission, onward)    None        Subjective: Daniel Crane today has no fevers, no emesis,  No chest pain,  -Concerns about rectal bleeding after colonoscopy -Currently n.p.o.  Objective: Vitals:   11/01/22 0700 11/01/22 0800 11/01/22 0900 11/01/22 1000  BP: 132/64 117/62 136/69 133/79  Pulse: 72 64 66 72  Resp: '17 16 15 15  '$ Temp:      TempSrc:      SpO2: 97% 95% 97% 97%  Weight:      Height:        Intake/Output Summary (Last 24 hours) at 11/01/2022 1111 Last data filed at 11/01/2022 0847 Gross per 24 hour  Intake 1000  ml  Output 625 ml  Net 375 ml   Filed Weights   11/01/22 0000  Weight: 135.5 kg   Physical Exam  Gen:- Awake Alert,, in no acute distress HEENT:- [place].AT, No sclera icterus Neck-Supple Neck,No JVD,.  Lungs-  CTAB , fair symmetrical air movement CV- S1, S2 normal, regular  Abd-  +ve B.Sounds, Abd Soft, No tenderness, increased truncal adiposity Extremity/Skin:- No  edema, pedal pulses present  Psych-affect is appropriate, oriented x3 Neuro-no new focal deficits, no tremors  Data Reviewed: I have personally reviewed following labs and imaging studies  CBC: Recent Labs  Lab 10/31/22 1936 11/01/22 0621  WBC 12.5* 10.7*  HGB 14.3 11.9*  HCT 41.1 35.3*   MCV 94.3 95.9  PLT 319 631   Basic Metabolic Panel: Recent Labs  Lab 10/31/22 1936 11/01/22 0621  NA 138 137  K 4.2 3.6  CL 106 107  CO2 20* 23  GLUCOSE 132* 110*  BUN 20 18  CREATININE 1.26* 0.82  CALCIUM 8.8* 8.2*  MG  --  2.0  PHOS  --  3.5   GFR: Estimated Creatinine Clearance: 144 mL/min (by C-G formula based on SCr of 0.82 mg/dL). Liver Function Tests: Recent Labs  Lab 10/31/22 1936  AST 66*  ALT 98*  ALKPHOS 56  BILITOT 0.5  PROT 6.9  ALBUMIN 3.8   Radiology Studies:  Scheduled Meds:  amLODipine  5 mg Oral Daily   Chlorhexidine Gluconate Cloth  6 each Topical Daily   pantoprazole (PROTONIX) IV  40 mg Intravenous Q12H   rosuvastatin  20 mg Oral Daily   Continuous Infusions:  sodium chloride Stopped (10/31/22 2304)    LOS: 0 days   Roxan Hockey M.D on 11/01/2022 at 11:11 AM  Go to [email or web address] - for contact info  [facility] - Office  [phone]  If 7PM-7AM, please contact night-coverage [email or web address] 11/01/2022, 11:11 AM

## 2022-11-01 NOTE — Plan of Care (Signed)

## 2022-11-01 NOTE — Op Note (Signed)
[facility] Patient Name: Trevell Crane Procedure Date: 11/01/2022 3:04 PM MRN: [id number] Date of Birth: 10-11-1961 Attending MD: Maylon Peppers , , [id number] CSN: [id number] Age: 60 Admit Type: Outpatient Procedure:                Colonoscopy Indications:              Rectal bleeding, history of recent polypectomy Providers:                Maylon Peppers, Crystal Page, Raphael Gibney,                            Technician Referring MD:              Medicines:                Monitored Anesthesia Care Complications:            No immediate complications. Estimated Blood Loss:     Estimated blood loss: none. Procedure:                Pre-Anesthesia Assessment:                           - Prior to the procedure, a History and Physical                            was performed, and patient medications, allergies                            and sensitivities were reviewed. The patient's                            tolerance of previous anesthesia was reviewed.                           - The risks and benefits of the procedure and the                            sedation options and risks were discussed with the                            patient. All questions were answered and informed                            consent was obtained.                           - ASA Grade Assessment: II - A patient with mild                            systemic disease.                           After obtaining informed consent, the colonoscope                            was passed under direct vision. Throughout the  procedure, the patient's blood pressure, pulse, and                            oxygen saturations were monitored continuously. The                            [id number]-HQ190L ([id number]) scope was introduced through                            the anus and advanced to the the terminal ileum.                            The colonoscopy was performed without difficulty.                             The patient tolerated the procedure well. The                            quality of the bowel preparation was fair. Scope In: 3:18:48 PM Scope Out: 3:45:18 PM Scope Withdrawal Time: 0 hours 17 minutes 48 seconds  Total Procedure Duration: 0 hours 26 minutes 30 seconds  Findings:      The perianal and digital rectal examinations were normal.      Clotted blood was found in the entire colon, but predominantly in the       left side of the colon. Polypectomy sites were thoroughly inspected and       no active bleeding/vessels were identified.      Two hemostatic clips were found in the sigmoid colon. They were firmly       attached to the mucosa at the polypectomy sites. One of the polypectomy       sites had stigmata of probable recent bleeding but clip was keeping       hemostasis.      Three sessile polyps were found in the sigmoid colon and descending       colon. The polyps were 2 to 4 mm in size.      The retroflexed view of the distal rectum and anal verge was normal and       showed no anal or rectal abnormalities.      Note: small polyps were not removed given recent episode of severe       bleeding. Impression:               - Preparation of the colon was fair.                           - Blood in the entire examined colon.                           - Foreign body in the sigmoid colon.                           - Three 2 to 4 mm polyps in the sigmoid colon and                            in the descending colon.                           -  The distal rectum and anal verge are normal on                            retroflexion view.                           - No specimens collected. Moderate Sedation:      Per Anesthesia Care Recommendation:           - Return patient to hospital ward for ongoing care.                           - Soft diet.                           - Consider discharging home in the morning if no                            ongoing  fresh blood in stool or significant drop in                            hemoglobin.                           - H/H in AM. Procedure Code(s):        --- Professional ---                           [phone], Colonoscopy, flexible; diagnostic, including                            collection of specimen(s) by brushing or washing,                            when performed (separate procedure) Diagnosis Code(s):        --- Professional ---                           K92.2, Gastrointestinal hemorrhage, unspecified                           T18.4XXA, Foreign body in colon, initial encounter                           D12.5, Benign neoplasm of sigmoid colon                           D12.4, Benign neoplasm of descending colon                           K62.5, Hemorrhage of anus and rectum CPT copyright 2022 [employer]. All rights reserved. The codes documented in this report are preliminary and upon coder review may  be revised to meet current compliance requirements. Maylon Peppers, MD Maylon Peppers,  11/01/2022 3:53:41 PM This report has been signed electronically. Number of Addenda: 0

## 2022-11-01 NOTE — Telephone Encounter (Signed)
Communication noted.  

## 2022-11-01 NOTE — Brief Op Note (Signed)
10/31/2022 - 11/01/2022  3:52 PM  PATIENT:  Daniel Crane  61 y.o. male  PRE-OPERATIVE DIAGNOSIS:  post polypectomy GI bleed, symptomatic anemia  POST-OPERATIVE DIAGNOSIS:  no active bleeding, 2 descending polyps, 1 sigmoid polyp, two polypectomy sites with clips with no active bleeding.  PROCEDURE:  Procedure(s): COLONOSCOPY WITH PROPOFOL (N/A)  SURGEON:  Surgeon(s) and Role:    * Harvel Quale, MD - Primary  Patient underwent colonoscopy under propofol sedation.  Tolerated the procedure adequately.  Clotted blood was found in the entire colon, but predominantly in the left side of the colon. Polypectomy sites were thoroughly inspected and no active bleeding/vessels were identified. Two hemostatic clips were found in the sigmoid colon. They were firmly attached to the mucosa at the polypectomy sites. One of the polypectomy sites had stigmata of probable recent bleeding but clip was keeping hemostasis. Three sessile polyps were found in the sigmoid colon and descending colon.  The polyps were 2 to 4 mm in size.   RECOMMENDATIONS - Return patient to hospital ward for ongoing care.  - Soft diet.  - Consider discharging home in the morning if no ongoing fresh blood in stool or significant drop in hemoglobin. - H/H in AM.  Maylon Peppers, MD [facility]

## 2022-11-02 DIAGNOSIS — T184XXA Foreign body in colon, initial encounter: Secondary | ICD-10-CM | POA: Diagnosis not present

## 2022-11-02 DIAGNOSIS — K625 Hemorrhage of anus and rectum: Secondary | ICD-10-CM | POA: Diagnosis not present

## 2022-11-02 DIAGNOSIS — K635 Polyp of colon: Secondary | ICD-10-CM | POA: Diagnosis not present

## 2022-11-02 DIAGNOSIS — K922 Gastrointestinal hemorrhage, unspecified: Secondary | ICD-10-CM | POA: Diagnosis not present

## 2022-11-02 LAB — COMPREHENSIVE METABOLIC PANEL
ALT: 75 U/L — ABNORMAL HIGH (ref 0–44)
AST: 43 U/L — ABNORMAL HIGH (ref 15–41)
Albumin: 3.3 g/dL — ABNORMAL LOW (ref 3.5–5.0)
Alkaline Phosphatase: 45 U/L (ref 38–126)
Anion gap: 6 (ref 5–15)
BUN: 11 mg/dL (ref 6–20)
CO2: 23 mmol/L (ref 22–32)
Calcium: 8.6 mg/dL — ABNORMAL LOW (ref 8.9–10.3)
Chloride: 108 mmol/L (ref 98–111)
Creatinine, Ser: 0.79 mg/dL (ref 0.61–1.24)
GFR, Estimated: >60 mL/min (ref >60)
Glucose, Bld: 132 mg/dL — ABNORMAL HIGH (ref 70–99)
Potassium: 3.4 mmol/L — ABNORMAL LOW (ref 3.5–5.1)
Sodium: 137 mmol/L (ref 135–145)
Total Bilirubin: 0.3 mg/dL (ref 0.3–1.2)
Total Protein: 5.9 g/dL — ABNORMAL LOW (ref 6.5–8.1)

## 2022-11-02 LAB — CBC
HCT: 32.7 % — ABNORMAL LOW (ref 39.0–52.0)
Hemoglobin: 11.1 g/dL — ABNORMAL LOW (ref 13.0–17.0)
MCH: 32.7 pg (ref 26.0–34.0)
MCHC: 33.9 g/dL (ref 30.0–36.0)
MCV: 96.5 fL (ref 80.0–100.0)
Platelets: 214 10*3/uL (ref 150–400)
RBC: 3.39 MIL/uL — ABNORMAL LOW (ref 4.22–5.81)
RDW: 13.5 % (ref 11.5–15.5)
WBC: 7.5 10*3/uL (ref 4.0–10.5)
nRBC: 0 % (ref 0.0–0.2)

## 2022-11-02 LAB — PROTIME-INR
INR: 1 (ref 0.8–1.2)
Prothrombin Time: 13 seconds (ref 11.4–15.2)

## 2022-11-02 MED ORDER — ASPIRIN EC 81 MG PO TBEC: 81.0000 mg | DELAYED_RELEASE_TABLET | Freq: Every day | ORAL | 11 refills | Status: AC

## 2022-11-02 MED ORDER — PANTOPRAZOLE SODIUM 40 MG PO TBEC: 40.0000 mg | DELAYED_RELEASE_TABLET | Freq: Every day | ORAL | 1 refills | Status: DC

## 2022-11-02 MED ORDER — POTASSIUM CHLORIDE CRYS ER 20 MEQ PO TBCR
40.0000 meq | EXTENDED_RELEASE_TABLET | ORAL | Status: AC
  Administered 2022-11-02 (×2): 40 meq via ORAL
  Filled 2022-11-02: qty 2

## 2022-11-02 NOTE — Discharge Instructions (Signed)
1)Avoid ibuprofen/Advil/Aleve/Motrin/Goody Powders/Naproxen/BC powders/Meloxicam/Diclofenac/Indomethacin and other Nonsteroidal anti-inflammatory medications as these will make you more likely to bleed and can cause stomach ulcers, can also cause Kidney problems.   2) follow-up with gastroenterologist as previously advised  3) repeat CBC and CMP blood test with primary care physician in 1 to 2 weeks

## 2022-11-02 NOTE — Discharge Summary (Signed)
Daniel Crane, is a 61 y.o. male  DOB [birth year]  MRN [id number].  Admission date:  10/31/2022  Admitting Physician  Bernadette Hoit, DO  Discharge Date:  11/02/2022   Primary MD  Dettinger, [name], MD  Recommendations for primary care physician for things to follow:   1)Avoid ibuprofen/Advil/Aleve/Motrin/Goody Powders/Naproxen/BC powders/Meloxicam/Diclofenac/Indomethacin and other Nonsteroidal anti-inflammatory medications as these will make you more likely to bleed and can cause stomach ulcers, can also cause Kidney problems.   2) follow-up with gastroenterologist as previously advised  3) repeat CBC and CMP blood test with primary care physician in 1 to 2 weeks  Admission Diagnosis  GI bleed [K92.2] Gastrointestinal hemorrhage associated with anorectal source [K62.5]   Discharge Diagnosis  GI bleed [K92.2] Gastrointestinal hemorrhage associated with anorectal source [K62.5]    Principal Problem:   Acute lower GI bleeding Active Problems:   Acute blood loss anemia   Mixed hyperlipidemia   Essential hypertension   GI bleed   Leukocytosis   Hepatic steatosis      Past Medical History:  Diagnosis Date   Anginal pain ([place])    history of 11 years ago. No meds at present.   Arthritis    Collagen vascular disease ([place])    Heart disease    catheterization in remote past, medically managed   Hypercholesteremia    Hypertension    Sleep apnea     Past Surgical History:  Procedure Laterality Date   APPENDECTOMY     CARDIAC CATHETERIZATION     CATARACT EXTRACTION W/PHACO Left 04/25/2016   Procedure: CATARACT EXTRACTION PHACO AND INTRAOCULAR LENS PLACEMENT ([place]);  Surgeon: Baruch Goldmann, MD;  Location: [facility];  Service: Ophthalmology;  Laterality: Left;  CDE 3.23   CATARACT EXTRACTION W/PHACO Right 05/23/2016   Procedure: CATARACT EXTRACTION PHACO AND INTRAOCULAR LENS PLACEMENT RIGHT EYE  CDE=1.11;  Surgeon: Baruch Goldmann, MD;  Location: [facility];  Service: Ophthalmology;  Laterality: Right;   COLONOSCOPY N/A 10/09/2015   Procedure: COLONOSCOPY;  Surgeon: Daneil Dolin, MD;  Location: [facility];  Service: Endoscopy;  Laterality: N/A;  1230 - pt can't come earlier - transportation issues   dental implants         HPI  from the history and physical done on the day of admission:     ***  ****     Hospital Course:     No notes on file  ***** Assessment and Plan: No notes have been filed under this hospital service. Service: Hospitalist       Discharge Condition: ***  Follow UP   Follow-up Information     Dettinger, [name], MD. Schedule an appointment as soon as possible for a visit in 1 week(s).   Specialties: Family Medicine, Cardiology Why: Repeat CBC and CMP blood work in about a week or so Contact information: [address] [place] [address] [phone]                  Consults obtained - ***  Diet and Activity  recommendation:  As advised  Discharge Instructions    **** Discharge Instructions     Call MD for:  difficulty breathing, headache or visual disturbances   Complete by: As directed    Call MD for:  persistant dizziness or light-headedness   Complete by: As directed    Call MD for:  persistant nausea and vomiting   Complete by: As directed    Call MD for:  severe uncontrolled pain   Complete by: As directed    Call MD for:  temperature >100.4   Complete by: As directed    Diet - low sodium heart healthy   Complete by: As directed    Discharge instructions   Complete by: As directed    1)Avoid ibuprofen/Advil/Aleve/Motrin/Goody Powders/Naproxen/BC powders/Meloxicam/Diclofenac/Indomethacin and other Nonsteroidal anti-inflammatory medications as these will make you more likely to bleed and can cause stomach ulcers, can also cause Kidney problems.   2) follow-up with gastroenterologist as previously  advised  3) repeat CBC and CMP blood test with primary care physician in 1 to 2 weeks   Increase activity slowly   Complete by: As directed          Discharge Medications     Allergies as of 11/02/2022   No Known Allergies      Medication List     STOP taking these medications    diclofenac 75 MG EC tablet Commonly known as: VOLTAREN       TAKE these medications    amLODipine 5 MG tablet Commonly known as: NORVASC Take 1 tablet (5 mg total) by mouth daily.   ascorbic acid 500 MG tablet Commonly known as: VITAMIN C Take 500 mg by mouth daily.   aspirin EC 81 MG tablet Take 1 tablet (81 mg total) by mouth daily with breakfast. What changed: when to take this   cyclobenzaprine 10 MG tablet Commonly known as: FLEXERIL Take 10 mg by mouth at bedtime.   diclofenac Sodium 1 % Gel Commonly known as: Voltaren Apply 2 g topically 4 (four) times daily. What changed:  when to take this reasons to take this   FISH OIL PO Take 1 capsule by mouth daily.   MAGNESIUM PO Take 1 tablet by mouth See admin instructions. Take 1 tablet in the morning and 2 tablets every evening   Milk Thistle 1000 MG Caps Take 1 tablet by mouth every evening.   multivitamin with minerals tablet Take 1 tablet by mouth daily.   pantoprazole 40 MG tablet Commonly known as: Protonix Take 1 tablet (40 mg total) by mouth daily.   rosuvastatin 20 MG tablet Commonly known as: CRESTOR Take 1 tablet (20 mg total) by mouth daily. What changed: how much to take   traZODone 50 MG tablet Commonly known as: DESYREL Take 1 tablet (50 mg total) by mouth at bedtime.   Turmeric 400 MG Caps Take 400 mg by mouth 2 (two) times daily.   VITAMIN B12 PO Take 1 tablet by mouth daily.   zinc gluconate 50 MG tablet Take 50 mg by mouth daily.        Major procedures and Radiology Reports - PLEASE review detailed and final reports for all details, in brief -   ***  No results found.  Micro  Results   *** No results found for this or any previous visit (from the past 240 hour(s)).  Today   Subjective    Ab Leaming today has no ***  Patient has been seen and examined prior to discharge   Objective   Blood pressure 138/65, pulse 74, temperature (!) 97.4 F (36.3 C), temperature source Oral, resp. rate 11, height '6\' 4"'$  (1.93 m), weight 135.5 kg, SpO2 98 %.   Intake/Output Summary (Last 24 hours) at 11/02/2022 0924 Last data filed at 11/02/2022 0000 Gross per 24 hour  Intake 500 ml  Output 250 ml  Net 250 ml    Exam Gen:- Awake Alert, no acute distress *** HEENT:- [place].AT, No sclera icterus Neck-Supple Neck,No JVD,.  Lungs-  CTAB , good air movement bilaterally CV- S1, S2 normal, regular Abd-  +ve B.Sounds, Abd Soft, No tenderness,    Extremity/Skin:- No  edema,   good pulses Psych-affect is appropriate, oriented x3 Neuro-no new focal deficits, no tremors ***   Data Review   CBC w Diff:  Lab Results  Component Value Date   WBC 7.5 11/02/2022   HGB 11.1 (L) 11/02/2022   HGB 15.9 08/12/2022   HCT 32.7 (L) 11/02/2022   HCT 46.7 08/12/2022   PLT 214 11/02/2022   PLT 276 08/12/2022   LYMPHOPCT 29 04/18/2016   MONOPCT 10 04/18/2016   EOSPCT 4 04/18/2016   BASOPCT 0 04/18/2016    CMP:  Lab Results  Component Value Date   NA 137 11/02/2022   NA 141 08/12/2022   K 3.4 (L) 11/02/2022   CL 108 11/02/2022   CO2 23 11/02/2022   BUN 11 11/02/2022   BUN 13 08/12/2022   CREATININE 0.79 11/02/2022   PROT 5.9 (L) 11/02/2022   PROT 7.6 08/12/2022   ALBUMIN 3.3 (L) 11/02/2022   ALBUMIN 4.7 08/12/2022   BILITOT 0.3 11/02/2022   BILITOT 0.5 08/12/2022   ALKPHOS 45 11/02/2022   AST 43 (H) 11/02/2022   ALT 75 (H) 11/02/2022  .  Total Discharge time is about 33 minutes  Roxan Hockey M.D on 11/02/2022 at 9:24 AM  Go to [email or web address] -  for contact info  [facility] - Office  [phone]

## 2022-11-02 NOTE — Progress Notes (Signed)
Daniel Crane, M.D. Gastroenterology & Hepatology   Interval History:  No acute events overnight.  Patient reports feeling well and denies having any complaints, states that he had 1 regular bowel movement today that was brown in color. No more lightheadedness or dizziness.  Hemoglobin remained relatively stable with most recent this result of 11.1.  Inpatient Medications:  Current Facility-Administered Medications:    [COMPLETED] sodium chloride 0.9 % bolus 500 mL, 500 mL, Intravenous, Once, Stopped at 10/31/22 2036 **FOLLOWED BY** 0.9 %  sodium chloride infusion, 1,000 mL, Intravenous, Continuous, Adefeso, Oladapo, DO, Stopped at 10/31/22 2304   0.9 %  sodium chloride infusion, , Intravenous, Continuous, Mickle Mallory, Courtney L, NP, Stopped at 11/01/22 1633   amLODipine (NORVASC) tablet 5 mg, 5 mg, Oral, Daily, Adefeso, Oladapo, DO, 5 mg at 11/01/22 0846   Chlorhexidine Gluconate Cloth 2 % PADS 6 each, 6 each, Topical, Daily, Adefeso, Oladapo, DO, 6 each at 11/01/22 1949   lactated ringers infusion, , Intravenous, Continuous, Battula, Rajamani C, MD, Stopped at 11/01/22 1634   pantoprazole (PROTONIX) injection 40 mg, 40 mg, Intravenous, Q12H, Adefeso, Oladapo, DO, 40 mg at 11/01/22 2238   potassium chloride SA (KLOR-CON M) CR tablet 40 mEq, 40 mEq, Oral, Q3H, Emokpae, Courage, MD   rosuvastatin (CRESTOR) tablet 20 mg, 20 mg, Oral, Daily, Adefeso, Oladapo, DO, 20 mg at 11/01/22 0846   I/O    Intake/Output Summary (Last 24 hours) at 11/02/2022 0837 Last data filed at 11/02/2022 0000 Gross per 24 hour  Intake 500 ml  Output 875 ml  Net -375 ml     Physical Exam: Temp:  [97.4 F (36.3 C)-98.9 F (37.2 C)] 97.4 F (36.3 C) (11/04 0809) Pulse Rate:  [62-88] 72 (11/04 0800) Resp:  [12-21] 17 (11/04 0800) BP: (86-167)/(40-91) 138/65 (11/04 0800) SpO2:  [95 %-100 %] 97 % (11/04 0800)  Temp (24hrs), Avg:97.9 F (36.6 C), Min:97.4 F (36.3 C), Max:98.9 F (37.2 C) GENERAL: The patient is  AO x3, in no acute distress. HEENT: Head is normocephalic and atraumatic. EOMI are intact. Mouth is well hydrated and without lesions. NECK: Supple. No masses LUNGS: Clear to auscultation. No presence of rhonchi/wheezing/rales. Adequate chest expansion HEART: RRR, normal s1 and s2. ABDOMEN: Soft, nontender, no guarding, no peritoneal signs, and nondistended. BS +. No masses. EXTREMITIES: Without any cyanosis, clubbing, rash, lesions or edema. NEUROLOGIC: AOx3, no focal motor deficit. SKIN: no jaundice, no rashes  Laboratory Data: CBC:     Component Value Date/Time   WBC 7.5 11/02/2022 0327   RBC 3.39 (L) 11/02/2022 0327   HGB 11.1 (L) 11/02/2022 0327   HGB 15.9 08/12/2022 1517   HCT 32.7 (L) 11/02/2022 0327   HCT 46.7 08/12/2022 1517   PLT 214 11/02/2022 0327   PLT 276 08/12/2022 1517   MCV 96.5 11/02/2022 0327   MCV 94 08/12/2022 1517   MCH 32.7 11/02/2022 0327   MCHC 33.9 11/02/2022 0327   RDW 13.5 11/02/2022 0327   RDW 13.0 08/12/2022 1517   LYMPHSABS 1.5 08/12/2022 1517   MONOABS 0.6 04/18/2016 0908   EOSABS 0.2 08/12/2022 1517   BASOSABS 0.1 08/12/2022 1517   COAG:  Lab Results  Component Value Date   INR 1.0 11/02/2022    BMP:     Latest Ref Rng & Units 11/02/2022    3:27 AM 11/01/2022    6:21 AM 10/31/2022    7:36 PM  BMP  Glucose 70 - 99 mg/dL 132  110  132   BUN 6 -  20 mg/dL _0 Creatinine 0.61 - 1.24 mg/dL 0.79  0.82  1.26   Sodium 135 - 145 mmol/L 137  137  138   Potassium 3.5 - 5.1 mmol/L 3.4  3.6  4.2   Chloride 98 - 111 mmol/L 108  107  106   CO2 22 - 32 mmol/L _1 Calcium 8.9 - 10.3 mg/dL 8.6  8.2  8.8     HEPATIC:     Latest Ref Rng & Units 11/02/2022    3:27 AM 10/31/2022    7:36 PM 08/12/2022    3:17 PM  Hepatic Function  Total Protein 6.5 - 8.1 g/dL 5.9  6.9  7.6   Albumin 3.5 - 5.0 g/dL 3.3  3.8  4.7   AST 15 - 41 U/L 43  66  46   ALT 0 - 44 U/L 75  98  65   Alk Phosphatase 38 - 126 U/L 45  56  71   Total Bilirubin 0.3  - 1.2 mg/dL 0.3  0.5  0.5     CARDIAC:  Lab Results  Component Value Date   CKTOTAL 110 10/08/2010   CKMB 2.7 10/08/2010   TROPONINI <0.01        NO INDICATION OF MYOCARDIAL INJURY. 10/08/2010      Imaging: I personally reviewed and interpreted the available labs, imaging and endoscopic files.   Assessment/Plan: 61 year old male with past medical history of HTN and HLD, who came to the hospital for evaluation after presenting bleeding after recent colonoscopy with polypectomy.  Patient presented a syncopal episode and decided come to the ER for further evaluation.  Patient had a significant drop in his hemoglobin down to 11.9.  Due to this, a decision was made to repeat a colonoscopy yesterday.  Thorough evaluation of the colon did not show any active bleeding, polypectomy sites were thoroughly inspected and there was presence of polypectomy changes but no active bleeding at the sites, 2 clips were seen in the sigmoid colon achieving adequate hemostasis.  Patient likely bled from large polypectomy in the sigmoid as there was another clot in this area.  3 diminutive polyps were seen as well.  Pathology of the polyps were consistent with tubular adenomas which I informed to the patient.  As patient has presented improvement of his symptoms with stable hemoglobin, he will be able to go home today.  Timing for repeat colonoscopy will be discussed between his primary gastroenterologist (Dr. Gala Romney) and the patient.  Patient can go home with regular diet.  Daniel Peppers, MD Gastroenterology and Hepatology [facility]

## 2022-11-02 NOTE — Progress Notes (Signed)
Patient given discharge instructions. Verbalized understanding. One peripheral IV removed prior to discharge. Patient taken down via wheelchair to main entrance to be discharged to home with wife.

## 2022-11-03 ENCOUNTER — Telehealth: Payer: Self-pay | Admitting: Internal Medicine

## 2022-11-04 ENCOUNTER — Encounter: Payer: Self-pay | Admitting: Internal Medicine

## 2022-11-04 NOTE — Telephone Encounter (Signed)
Called patient last evening to check on him.  He was doing well status post post polypectomy bleeding for which she was admitted to [facility] and underwent a repeat colonoscopy.  Apparently, bleeding ceased at time colonoscopy was performed.  Significant drop in hemoglobin no blood transfusion needed.  Discussed with Dr. Jenetta Downer.  He felt there were a couple of residual tiny polyps present in his sigmoid colon. Discussed all with patient.  He is at home doing well.  Commended follow-up in 1 year regarding elevated transaminases.  Surveillance colonoscopy recommended in 3 years.

## 2022-11-06 ENCOUNTER — Encounter (HOSPITAL_COMMUNITY): Payer: Self-pay | Admitting: Internal Medicine

## 2022-11-07 ENCOUNTER — Ambulatory Visit (INDEPENDENT_AMBULATORY_CARE_PROVIDER_SITE_OTHER): Payer: No Typology Code available for payment source | Admitting: Family Medicine

## 2022-11-07 ENCOUNTER — Encounter: Payer: Self-pay | Admitting: Family Medicine

## 2022-11-07 VITALS — BP 146/76 | HR 88 | Temp 98.9°F | Ht 76.0 in | Wt 311.0 lb

## 2022-11-07 DIAGNOSIS — R7401 Elevation of levels of liver transaminase levels: Secondary | ICD-10-CM | POA: Diagnosis not present

## 2022-11-07 DIAGNOSIS — K922 Gastrointestinal hemorrhage, unspecified: Secondary | ICD-10-CM

## 2022-11-07 DIAGNOSIS — D649 Anemia, unspecified: Secondary | ICD-10-CM | POA: Diagnosis not present

## 2022-11-07 NOTE — Progress Notes (Signed)
BP (!) 146/76   Pulse 88   Temp 98.9 F (37.2 C)   Ht _0  (1.93 m)   Wt (!) 311 lb (141.1 kg)   SpO2 92%   BMI 37.86 kg/m    Subjective:   Patient ID: Daniel Crane, male    DOB: 13-Dec-1961, 61 y.o.   MRN: [id number]  HPI: Daniel Crane is a 61 y.o. male presenting on 11/07/2022 for Hospitalization Follow-up (Pt states he is doing a lot better than he was , dont have any complaints )   HPI GI bleed Patient is coming in today for hospital follow-up for GI bleed.  He was admitted on 10/31/2022 and discharged on 11/02/2022.  Prior to the hospitalization looks like patient had a colonoscopy earlier that day and several polyps removed and then he was having blood in the stool on the small scale but then had larger amounts of blood discharged.  During the ED he was monitored and had a repeat colonoscopy on 11/01/2022 without visible bleeding.  Upon discharge his hemoglobin was 11.1 which was slightly down from his baseline of 15.  Patient was also noted to have transaminitis.  Since leaving the hospital he has had no further bleeding.  He denies any lightheadedness or dizziness and he feels like his energy is back.  He is feeling pretty well.  Relevant past medical, surgical, family and social history reviewed and updated as indicated. Interim medical history since our last visit reviewed. Allergies and medications reviewed and updated.  Review of Systems  Constitutional:  Negative for chills and fever.  Eyes:  Negative for visual disturbance.  Respiratory:  Negative for shortness of breath and wheezing.   Cardiovascular:  Negative for chest pain and leg swelling.  Musculoskeletal:  Negative for back pain and gait problem.  Skin:  Negative for rash.  Neurological:  Negative for dizziness, weakness and light-headedness.  All other systems reviewed and are negative.   Per HPI unless specifically indicated above   Allergies as of 11/07/2022   No Known Allergies      Medication List         Accurate as of November 07, 2022  9:26 AM. If you have any questions, ask your nurse or doctor.          amLODipine 5 MG tablet Commonly known as: NORVASC Take 1 tablet (5 mg total) by mouth daily.   ascorbic acid 500 MG tablet Commonly known as: VITAMIN C Take 500 mg by mouth daily.   aspirin EC 81 MG tablet Take 1 tablet (81 mg total) by mouth daily with breakfast.   cyclobenzaprine 10 MG tablet Commonly known as: FLEXERIL Take 10 mg by mouth at bedtime.   diclofenac Sodium 1 % Gel Commonly known as: Voltaren Apply 2 g topically 4 (four) times daily. What changed:  when to take this reasons to take this   FISH OIL PO Take 1 capsule by mouth daily.   MAGNESIUM PO Take 1 tablet by mouth See admin instructions. Take 1 tablet in the morning and 2 tablets every evening   Milk Thistle 1000 MG Caps Take 1 tablet by mouth every evening.   multivitamin with minerals tablet Take 1 tablet by mouth daily.   pantoprazole 40 MG tablet Commonly known as: Protonix Take 1 tablet (40 mg total) by mouth daily.   rosuvastatin 20 MG tablet Commonly known as: CRESTOR Take 1 tablet (20 mg total) by mouth daily. What changed: how much to take  traZODone 50 MG tablet Commonly known as: DESYREL Take 1 tablet (50 mg total) by mouth at bedtime.   Turmeric 400 MG Caps Take 400 mg by mouth 2 (two) times daily.   VITAMIN B12 PO Take 1 tablet by mouth daily.   zinc gluconate 50 MG tablet Take 50 mg by mouth daily.         Objective:   BP (!) 146/76   Pulse 88   Temp 98.9 F (37.2 C)   Ht _0  (1.93 m)   Wt (!) 311 lb (141.1 kg)   SpO2 92%   BMI 37.86 kg/m   Wt Readings from Last 3 Encounters:  11/07/22 (!) 311 lb (141.1 kg)  11/01/22 298 lb 11.6 oz (135.5 kg)  10/31/22 (!) 315 lb 4.1 oz (143 kg)    Physical Exam Vitals and nursing note reviewed.  Constitutional:      General: He is not in acute distress.    Appearance: He is well-developed. He is  not diaphoretic.  Eyes:     General: No scleral icterus.    Conjunctiva/sclera: Conjunctivae normal.  Neck:     Thyroid: No thyromegaly.  Cardiovascular:     Rate and Rhythm: Normal rate and regular rhythm.     Heart sounds: Normal heart sounds. No murmur heard. Pulmonary:     Effort: Pulmonary effort is normal. No respiratory distress.     Breath sounds: Normal breath sounds. No wheezing.  Musculoskeletal:        General: Normal range of motion.     Cervical back: Neck supple.  Lymphadenopathy:     Cervical: No cervical adenopathy.  Skin:    General: Skin is warm and dry.     Findings: No rash.  Neurological:     Mental Status: He is alert and oriented to person, place, and time.     Coordination: Coordination normal.  Psychiatric:        Behavior: Behavior normal.       Assessment & Plan:   Problem List Items Addressed This Visit       Digestive   Acute lower GI bleeding - Primary   Relevant Orders   CBC with Differential/Platelet   CMP14+EGFR   Other Visit Diagnoses     Symptomatic anemia       Relevant Orders   CBC with Differential/Platelet   CMP14+EGFR   Transaminitis       Relevant Orders   CBC with Differential/Platelet   CMP14+EGFR     We will recheck patient's blood counts but it sounds like he is doing well and stabilized physical extremity  Follow up plan: Return if symptoms worsen or fail to improve.  Counseling provided for all of the vaccine components Orders Placed This Encounter  Procedures   CBC with Differential/Platelet   CMP14+EGFR    Caryl Pina, MD [facility] 11/07/2022, 9:26 AM     Hospital follow-up

## 2022-11-08 LAB — CMP14+EGFR
ALT: 55 IU/L — ABNORMAL HIGH (ref 0–44)
AST: 40 IU/L (ref 0–40)
Albumin/Globulin Ratio: 1.7 (ref 1.2–2.2)
Albumin: 4.5 g/dL (ref 3.8–4.9)
Alkaline Phosphatase: 64 IU/L (ref 44–121)
BUN/Creatinine Ratio: 15 (ref 10–24)
BUN: 12 mg/dL (ref 8–27)
Bilirubin Total: 0.4 mg/dL (ref 0.0–1.2)
CO2: 21 mmol/L (ref 20–29)
Calcium: 9 mg/dL (ref 8.6–10.2)
Chloride: 104 mmol/L (ref 96–106)
Creatinine, Ser: 0.82 mg/dL (ref 0.76–1.27)
Globulin, Total: 2.6 g/dL (ref 1.5–4.5)
Glucose: 107 mg/dL — ABNORMAL HIGH (ref 70–99)
Potassium: 4.2 mmol/L (ref 3.5–5.2)
Sodium: 141 mmol/L (ref 134–144)
Total Protein: 7.1 g/dL (ref 6.0–8.5)
eGFR: 101 mL/min/{1.73_m2} (ref >59)

## 2022-11-08 LAB — CBC WITH DIFFERENTIAL/PLATELET
Basophils Absolute: 0.1 10*3/uL (ref 0.0–0.2)
Basos: 1 %
EOS (ABSOLUTE): 0.3 10*3/uL (ref 0.0–0.4)
Eos: 4 %
Hematocrit: 35.1 % — ABNORMAL LOW (ref 37.5–51.0)
Hemoglobin: 11.7 g/dL — ABNORMAL LOW (ref 13.0–17.7)
Immature Grans (Abs): 0 10*3/uL (ref 0.0–0.1)
Immature Granulocytes: 0 %
Lymphocytes Absolute: 1.9 10*3/uL (ref 0.7–3.1)
Lymphs: 26 %
MCH: 31.9 pg (ref 26.6–33.0)
MCHC: 33.3 g/dL (ref 31.5–35.7)
MCV: 96 fL (ref 79–97)
Monocytes Absolute: 0.6 10*3/uL (ref 0.1–0.9)
Monocytes: 8 %
Neutrophils Absolute: 4.4 10*3/uL (ref 1.4–7.0)
Neutrophils: 61 %
Platelets: 301 10*3/uL (ref 150–450)
RBC: 3.67 x10E6/uL — ABNORMAL LOW (ref 4.14–5.80)
RDW: 13.3 % (ref 11.6–15.4)
WBC: 7.1 10*3/uL (ref 3.4–10.8)

## 2022-11-11 ENCOUNTER — Encounter (HOSPITAL_COMMUNITY): Payer: Self-pay | Admitting: Gastroenterology

## 2022-11-27 ENCOUNTER — Ambulatory Visit: Payer: No Typology Code available for payment source | Admitting: Family Medicine

## 2022-12-13 ENCOUNTER — Ambulatory Visit (INDEPENDENT_AMBULATORY_CARE_PROVIDER_SITE_OTHER): Payer: No Typology Code available for payment source | Admitting: Gastroenterology

## 2022-12-13 ENCOUNTER — Encounter: Payer: Self-pay | Admitting: Gastroenterology

## 2022-12-13 VITALS — BP 166/91 | HR 75 | Temp 97.7°F | Ht 76.0 in | Wt 320.2 lb

## 2022-12-13 DIAGNOSIS — Z86010 Personal history of colonic polyps: Secondary | ICD-10-CM | POA: Diagnosis not present

## 2022-12-13 DIAGNOSIS — R7989 Other specified abnormal findings of blood chemistry: Secondary | ICD-10-CM | POA: Diagnosis not present

## 2022-12-13 DIAGNOSIS — K760 Fatty (change of) liver, not elsewhere classified: Secondary | ICD-10-CM | POA: Diagnosis not present

## 2022-12-13 NOTE — Progress Notes (Signed)
GI Office Note    Referring Provider: Dettinger, [name], MD Primary Care Physician:  Dettinger, [name], MD  Primary Gastroenterologist: Garfield Cornea, MD   Chief Complaint   Chief Complaint  Patient presents with   Follow-up    States that he is having a hard time getting his energy level back, all else is good.     History of Present Illness   Daniel Crane is a 61 y.o. male presenting today for hospital follow up. He recently underwent a colonoscopy and had numerous polyps removed. He developed a postpolypectomy bleed same day and was admitted. Colonoscopy the following day with blood throughout the colon. One of the polypectomy sites had stigmata of probable recent bleeding but clip was keeping hemostasis. At presentation to the ER, his Hgb was 14.3. Hgb dropped down to 11.1.   Patient states he is feeling well except for fatigue. No abdominal pain. No heartburn. BM regular. No melena, brbpr. He is dealing with arthritis, stopped his diclofenac when he presented with bleeding.   He also has history of chronic intermittent elevation of LFTs. HCV RNA negative recently (wife previously had Hep C). U/S with fatty liver. Patient is on crestor.  11/01/2022: - Preparation of the colon was fair. - Blood in the entire examined colon. - Foreign body in the sigmoid colon. - Three 2 to 4 mm polyps in the sigmoid colon and in the descending colon. - The distal rectum and anal verge are normal on retroflexion view. - No specimens collected.  10/31/2022: -6 polyps removed from the transverse, descending and sigmoid segments 4 to 8 mm cold snared. - Diverticulosis in the sigmoid colon and in the descending colon. -Single 1.2 cm likely polyp in his mid sigmoid attempted hot snare but was cold snare due to equipment malfunction. Clip placed. -Normal hemorrhoids -tubular adenomas -next surveillance in 3 years   Medications   Current Outpatient Medications  Medication Sig  Dispense Refill   amLODipine (NORVASC) 5 MG tablet Take 1 tablet (5 mg total) by mouth daily. 90 tablet 3   aspirin EC 81 MG tablet Take 1 tablet (81 mg total) by mouth daily with breakfast. 30 tablet 11   Cyanocobalamin (VITAMIN B12 PO) Take 1 tablet by mouth daily.     cyclobenzaprine (FLEXERIL) 10 MG tablet Take 10 mg by mouth at bedtime.     MAGNESIUM PO Take 1 tablet by mouth See admin instructions. Take 1 tablet in the morning and 2 tablets every evening     Milk Thistle 1000 MG CAPS Take 1 tablet by mouth every evening.     Multiple Vitamins-Minerals (MULTIVITAMIN WITH MINERALS) tablet Take 1 tablet by mouth daily.     Omega-3 Fatty Acids (FISH OIL PO) Take 1 capsule by mouth daily.     rosuvastatin (CRESTOR) 20 MG tablet Take 1 tablet (20 mg total) by mouth daily. (Patient taking differently: Take 10 mg by mouth daily.) 90 tablet 3   traZODone (DESYREL) 50 MG tablet Take 1 tablet (50 mg total) by mouth at bedtime. 90 tablet 3   Turmeric 400 MG CAPS Take 400 mg by mouth 2 (two) times daily.      vitamin C (ASCORBIC ACID) 500 MG tablet Take 500 mg by mouth daily.     zinc gluconate 50 MG tablet Take 50 mg by mouth daily.     No current facility-administered medications for this visit.    Allergies   Allergies as of 12/13/2022   (  No Known Allergies)       Review of Systems   General: Negative for anorexia, weight loss, fever, chills,  weakness. +fatigue ENT: Negative for hoarseness, difficulty swallowing , nasal congestion. CV: Negative for chest pain, angina, palpitations, dyspnea on exertion, peripheral edema.  Respiratory: Negative for dyspnea at rest, dyspnea on exertion, cough, sputum, wheezing.  GI: See history of present illness. GU:  Negative for dysuria, hematuria, urinary incontinence, urinary frequency, nocturnal urination.  Endo: Negative for unusual weight change.     Physical Exam   BP (!) 166/91 (BP Location: Right Arm, Patient Position: Sitting, Cuff Size:  Large)   Pulse 75   Temp 97.7 F (36.5 C) (Oral)   Ht '6\' 4"'$  (1.93 m)   Wt (!) 320 lb 3.2 oz (145.2 kg)   SpO2 95%   BMI 38.98 kg/m    General: Well-nourished, well-developed in no acute distress.  Eyes: No icterus. Mouth: Oropharyngeal mucosa moist and pink , no lesions erythema or exudate. Lungs: Clear to auscultation bilaterally.  Heart: Regular rate and rhythm, no murmurs rubs or gallops.  Abdomen: Bowel sounds are normal, nontender, nondistended, no hepatosplenomegaly or masses,  no abdominal bruits or hernia , no rebound or guarding.  Rectal: not performed Extremities: No lower extremity edema. No clubbing or deformities. Neuro: Alert and oriented x 4   Skin: Warm and dry, no jaundice.   Psych: Alert and cooperative, normal mood and affect.  Labs   Lab Results  Component Value Date   CREATININE 0.82 11/07/2022   BUN 12 11/07/2022   NA 141 11/07/2022   K 4.2 11/07/2022   CL 104 11/07/2022   CO2 21 11/07/2022   Lab Results  Component Value Date   ALT 55 (H) 11/07/2022   AST 40 11/07/2022   ALKPHOS 64 11/07/2022   BILITOT 0.4 11/07/2022   Lab Results  Component Value Date   WBC 7.1 11/07/2022   HGB 11.7 (L) 11/07/2022   HCT 35.1 (L) 11/07/2022   MCV 96 11/07/2022   PLT 301 11/07/2022   No results found for: "IRON", "TIBC", "FERRITIN"   Imaging Studies   No results found.  Assessment   History of adenomatous colon polyps: Numerous tubular adenomas seen on recent colonoscopy.  Post polypectomy colonoscopy noted to have three 2 to 4 mm polyps present but not removed in the setting of post polypectomy bleeding.  Discussed with Dr. Gala Romney, surveillance colonoscopy as planned in 3 years.  Elevated AST/ALT: Noted back in June 2022.  Recent HCVRNA negative.  Fatty liver on ultrasound.  Also on Crestor.  Suspect fatty liver and/or medication related.  Will rule out hemochromatosis, check autoimmune labs to be sure.  The patient was found to have elevated blood  pressure when vital signs were checked in the office. The blood pressure was rechecked by the nursing staff and it was found be persistently elevated >140/90 mmHg. I personally advised to the patient to follow up closely with his PCP for hypertension control.   PLAN   Colonoscopy in November 2026. Update labs. Consider starting ferrous sulfate 325 mg twice daily, can wait until updated labs but given fatigue I suspect he may need.   Laureen Ochs. Bobby Rumpf, [place], [facility]

## 2022-12-13 NOTE — Patient Instructions (Addendum)
Please have your labs done at your convenience.  You can start ferrous sulfate '325mg'$  twice daily now or wait until we see your lab results.  I spoke with Dr. Gala Romney, he states your next colonoscopy will still be in 3 years even with the small polyps that were left.

## 2022-12-19 ENCOUNTER — Other Ambulatory Visit (HOSPITAL_COMMUNITY)
Admission: RE | Admit: 2022-12-19 | Discharge: 2022-12-19 | Disposition: A | Discharge status: Home / Self Care | Payer: No Typology Code available for payment source | Source: Ambulatory Visit | Attending: Gastroenterology | Admitting: Gastroenterology

## 2022-12-19 DIAGNOSIS — R7989 Other specified abnormal findings of blood chemistry: Secondary | ICD-10-CM | POA: Diagnosis not present

## 2022-12-19 DIAGNOSIS — Z86010 Personal history of colonic polyps: Secondary | ICD-10-CM | POA: Insufficient documentation

## 2022-12-19 DIAGNOSIS — K760 Fatty (change of) liver, not elsewhere classified: Secondary | ICD-10-CM | POA: Insufficient documentation

## 2022-12-19 LAB — CBC WITH DIFFERENTIAL/PLATELET
Abs Immature Granulocytes: 0.01 10*3/uL (ref 0.00–0.07)
Basophils Absolute: 0 10*3/uL (ref 0.0–0.1)
Basophils Relative: 1 %
Eosinophils Absolute: 0.2 10*3/uL (ref 0.0–0.5)
Eosinophils Relative: 4 %
HCT: 43.1 % (ref 39.0–52.0)
Hemoglobin: 14.5 g/dL (ref 13.0–17.0)
Immature Granulocytes: 0 %
Lymphocytes Relative: 26 %
Lymphs Abs: 1.8 10*3/uL (ref 0.7–4.0)
MCH: 31.9 pg (ref 26.0–34.0)
MCHC: 33.6 g/dL (ref 30.0–36.0)
MCV: 94.9 fL (ref 80.0–100.0)
Monocytes Absolute: 0.6 10*3/uL (ref 0.1–1.0)
Monocytes Relative: 9 %
Neutro Abs: 4.1 10*3/uL (ref 1.7–7.7)
Neutrophils Relative %: 60 %
Platelets: 261 10*3/uL (ref 150–400)
RBC: 4.54 MIL/uL (ref 4.22–5.81)
RDW: 13.2 % (ref 11.5–15.5)
WBC: 6.8 10*3/uL (ref 4.0–10.5)
nRBC: 0 % (ref 0.0–0.2)

## 2022-12-19 LAB — IRON AND TIBC
Iron: 68 ug/dL (ref 45–182)
Saturation Ratios: 16 % — ABNORMAL LOW (ref 17.9–39.5)
TIBC: 424 ug/dL (ref 250–450)
UIBC: 356 ug/dL

## 2022-12-20 LAB — ANTI-SMOOTH MUSCLE ANTIBODY, IGG: F-Actin IgG: 7 Units (ref 0–19)

## 2022-12-20 LAB — MITOCHONDRIAL ANTIBODIES: Mitochondrial M2 Ab, IgG: <20 Units (ref 0.0–20.0)

## 2022-12-20 LAB — IGG, IGA, IGM
IgA: 211 mg/dL (ref 61–437)
IgG (Immunoglobin G), Serum: 1132 mg/dL (ref 603–1613)
IgM (Immunoglobulin M), Srm: 83 mg/dL (ref 20–172)

## 2022-12-20 LAB — TISSUE TRANSGLUTAMINASE, IGA: Tissue Transglutaminase Ab, IgA: <2 U/mL (ref 0–3)

## 2022-12-20 LAB — ANA: Anti Nuclear Antibody (ANA): NEGATIVE

## 2022-12-23 ENCOUNTER — Encounter (HOSPITAL_COMMUNITY): Payer: Self-pay

## 2022-12-23 ENCOUNTER — Other Ambulatory Visit: Payer: Self-pay

## 2022-12-23 ENCOUNTER — Emergency Department (HOSPITAL_COMMUNITY)
Admission: EM | Admit: 2022-12-23 | Discharge: 2022-12-23 | Disposition: A | Discharge status: Home / Self Care | Payer: No Typology Code available for payment source | Attending: Emergency Medicine | Admitting: Emergency Medicine

## 2022-12-23 DIAGNOSIS — Z7982 Long term (current) use of aspirin: Secondary | ICD-10-CM | POA: Diagnosis not present

## 2022-12-23 DIAGNOSIS — J101 Influenza due to other identified influenza virus with other respiratory manifestations: Secondary | ICD-10-CM | POA: Insufficient documentation

## 2022-12-23 DIAGNOSIS — Z20822 Contact with and (suspected) exposure to covid-19: Secondary | ICD-10-CM | POA: Diagnosis not present

## 2022-12-23 DIAGNOSIS — I10 Essential (primary) hypertension: Secondary | ICD-10-CM | POA: Diagnosis not present

## 2022-12-23 DIAGNOSIS — Z79899 Other long term (current) drug therapy: Secondary | ICD-10-CM | POA: Insufficient documentation

## 2022-12-23 DIAGNOSIS — R509 Fever, unspecified: Secondary | ICD-10-CM | POA: Diagnosis present

## 2022-12-23 LAB — RESP PANEL BY RT-PCR (RSV, FLU A&B, COVID)  RVPGX2
Influenza A by PCR: POSITIVE — AB
Influenza B by PCR: NEGATIVE
Resp Syncytial Virus by PCR: NEGATIVE
SARS Coronavirus 2 by RT PCR: NEGATIVE

## 2022-12-23 NOTE — ED Provider Notes (Signed)
[name] EMERGENCY DEPARTMENT Provider Note   CSN: [id number] Arrival date & time: 12/23/22  1453     History  Chief Complaint  Patient presents with   Fever    Daniel Crane is a 60 y.o. male.  Patient presents the emergency department with a 1 day history of cough, fever, muscle aches, chills, headache, and sore throat.  Patient denies shortness of breath, nausea, vomiting.  Past medical history significant for hypertension  HPI     Home Medications Prior to Admission medications   Medication Sig Start Date End Date Taking? Authorizing Provider  amLODipine (NORVASC) 5 MG tablet Take 1 tablet (5 mg total) by mouth daily. 07/12/22   Dettinger, [name], MD  aspirin EC 81 MG tablet Take 1 tablet (81 mg total) by mouth daily with breakfast. 11/02/22   Emokpae, Courage, MD  Cyanocobalamin (VITAMIN B12 PO) Take 1 tablet by mouth daily.    [provider]  cyclobenzaprine (FLEXERIL) 10 MG tablet Take 10 mg by mouth at bedtime.    [provider]  MAGNESIUM PO Take 1 tablet by mouth See admin instructions. Take 1 tablet in the morning and 2 tablets every evening    [provider]  Milk Thistle 1000 MG CAPS Take 1 tablet by mouth every evening.    [provider]  Multiple Vitamins-Minerals (MULTIVITAMIN WITH MINERALS) tablet Take 1 tablet by mouth daily.    [provider]  Omega-3 Fatty Acids (FISH OIL PO) Take 1 capsule by mouth daily.    [provider]  rosuvastatin (CRESTOR) 20 MG tablet Take 1 tablet (20 mg total) by mouth daily. Patient taking differently: Take 10 mg by mouth daily. 07/12/22   Dettinger, [name], MD  traZODone (DESYREL) 50 MG tablet Take 1 tablet (50 mg total) by mouth at bedtime. 07/12/22   Dettinger, [name], MD  Turmeric 400 MG CAPS Take 400 mg by mouth 2 (two) times daily.     [provider]  vitamin C (ASCORBIC ACID) 500 MG tablet Take 500 mg by mouth daily.    [provider]  zinc  gluconate 50 MG tablet Take 50 mg by mouth daily.    [provider]      Allergies    Patient has no known allergies.    Review of Systems   Review of Systems  Constitutional:  Positive for chills and fever.  Respiratory:  Positive for cough.   Gastrointestinal:  Negative for abdominal pain and nausea.  Musculoskeletal:  Positive for myalgias.  Neurological:  Positive for headaches.    Physical Exam Updated Vital Signs Ht '6\' 4"'$  (1.93 m)   Wt (!) 148.8 kg   BMI 39.93 kg/m  Physical Exam Vitals and nursing note reviewed.  Constitutional:      General: He is not in acute distress.    Appearance: He is well-developed. He is obese.  HENT:     Head: Normocephalic and atraumatic.  Eyes:     Conjunctiva/sclera: Conjunctivae normal.  Cardiovascular:     Rate and Rhythm: Normal rate and regular rhythm.     Heart sounds: No murmur heard. Pulmonary:     Effort: Pulmonary effort is normal. No respiratory distress.     Breath sounds: Normal breath sounds.  Abdominal:     Palpations: Abdomen is soft.     Tenderness: There is no abdominal tenderness.  Musculoskeletal:        General: No swelling.     Cervical  back: Neck supple.  Skin:    General: Skin is warm and dry.     Capillary Refill: Capillary refill takes less than 2 seconds.  Neurological:     Mental Status: He is alert.  Psychiatric:        Mood and Affect: Mood normal.     ED Results / Procedures / Treatments   Labs (all labs ordered are listed, but only abnormal results are displayed) Labs Reviewed  RESP PANEL BY RT-PCR (RSV, FLU A&B, COVID)  RVPGX2 - Abnormal; Notable for the following components:      Result Value   Influenza A by PCR POSITIVE (*)    All other components within normal limits    EKG None  Radiology No results found.  Procedures Procedures    Medications Ordered in ED Medications - No data to display  ED Course/ Medical Decision Making/ A&P                            Medical Decision Making  Patient presents to the emergency room with a chief complaint of bodyaches, chills, fever, cough, headache.  Differential diagnosis includes but is not limited to COVID-19, influenza, RSV, and others  The patient has completed use including hypertension  I reviewed the patient's past medical history which included notes from hospitalization in early November due to a GI hemorrhage with subsequent colonoscopy finding colonic polyps  I ordered and reviewed labs.  Pertinent results include positive influenza A result  The patient's lungs are clear at this time and I see no indication for chest imaging  The patient has a new diagnosis of influenza.  I discussed Tamiflu with the patient and he has no interest at this time.  Patient to discharge home with instructions on supportive care including over-the-counter medications for pain and fever control.  Return precautions include shortness of breath        Final Clinical Impression(s) / ED Diagnoses Final diagnoses:  Influenza A    Rx / DC Orders ED Discharge Orders     None         Daniel Crane, Daniel Crane 12/23/22 1628    Sherwood Gambler, MD 12/23/22 1705

## 2022-12-23 NOTE — Discharge Instructions (Addendum)
You were diagnosed today with influenza A.  You declined Tamiflu at this time.  Please use over-the-counter medications such as ibuprofen for fever and pain control.   If you develop shortness of breath or other life-threatening symptoms please return to the emergency department

## 2022-12-23 NOTE — ED Triage Notes (Signed)
Pt presents wither cough, fever, muscle aches, chills, HA, sore throat that started yesterday.

## 2022-12-24 ENCOUNTER — Telehealth: Payer: Self-pay

## 2022-12-24 NOTE — Telephone Encounter (Signed)
Transition Care Management Follow-up Telephone Call Date of discharge and from where: Daniel Crane 12/23/2022 How have you been since you were released from the hospital? Feeling bad Any questions or concerns? No  Items Reviewed: Did the pt receive and understand the discharge instructions provided? Yes  Medications obtained and verified? Yes  Other? No  Any new allergies since your discharge? No  Dietary orders reviewed? Yes Do you have support at home? Yes   Home Care and Equipment/Supplies: Were home health services ordered? not applicable If so, what is the name of the agency? N/a  Has the agency set up a time to come to the patient's home? not applicable Were any new equipment or medical supplies ordered?  No What is the name of the medical supply agency? N/a Were you able to get the supplies/equipment? not applicable Do you have any questions related to the use of the equipment or supplies? No  Functional Questionnaire: (I = Independent and D = Dependent) ADLs: I  Bathing/Dressing- i  Meal Prep- i  Eating- i  Maintaining continence- i  Transferring/Ambulation- i  Managing Meds- i  Follow up appointments reviewed:  PCP Hospital f/u appt confirmed? Yes  Scheduled to see 12/25/2022  Video Visit per Barbaraann Rondo   no time given being worked in to schedule  [facility] f/u appt confirmed? No  Are transportation arrangements needed? No  If their condition worsens, is the pt aware to call PCP or go to the Emergency Dept.? Yes Was the patient provided with contact information for the PCP's office or ED? Yes Was to pt encouraged to call back with questions or concerns? Yes

## 2022-12-25 ENCOUNTER — Telehealth (INDEPENDENT_AMBULATORY_CARE_PROVIDER_SITE_OTHER): Payer: No Typology Code available for payment source | Admitting: Nurse Practitioner

## 2022-12-25 ENCOUNTER — Encounter: Payer: Self-pay | Admitting: Nurse Practitioner

## 2022-12-25 DIAGNOSIS — J101 Influenza due to other identified influenza virus with other respiratory manifestations: Secondary | ICD-10-CM

## 2022-12-25 MED ORDER — OSELTAMIVIR PHOSPHATE 75 MG PO CAPS: 75.0000 mg | ORAL_CAPSULE | Freq: Two times a day (BID) | ORAL | 0 refills | Status: DC

## 2022-12-25 MED ORDER — PROMETHAZINE-DM 6.25-15 MG/5ML PO SYRP: 5.0000 mL | ORAL_SOLUTION | Freq: Four times a day (QID) | ORAL | 0 refills | Status: DC | PRN

## 2022-12-25 NOTE — Progress Notes (Signed)
Virtual Visit Consent   Daniel Crane, you are scheduled for a virtual visit with Mary-Margaret Hassell Done, [place], a [facility] provider, today.     Just as with appointments in the office, your consent must be obtained to participate.  Your consent will be active for this visit and any virtual visit you may have with one of our providers in the next 365 days.     If you have a MyChart account, a copy of this consent can be sent to you electronically.  All virtual visits are billed to your insurance company just like a traditional visit in the office.    As this is a virtual visit, video technology does not allow for your provider to perform a traditional examination.  This may limit your provider's ability to fully assess your condition.  If your provider identifies any concerns that need to be evaluated in person or the need to arrange testing (such as labs, EKG, etc.), we will make arrangements to do so.     Although advances in technology are sophisticated, we cannot ensure that it will always work on either your end or our end.  If the connection with a video visit is poor, the visit may have to be switched to a telephone visit.  With either a video or telephone visit, we are not always able to ensure that we have a secure connection.     I need to obtain your verbal consent now.   Are you willing to proceed with your visit today? YES   Daniel Crane has provided verbal consent on 12/25/2022 for a virtual visit (video or telephone).   Mary-Margaret Hassell Done, FNP   Date: 12/25/2022 8:13 AM   Virtual Visit via Video Note   I, Mary-Margaret Hassell Done, connected with Daniel Crane ([id number], 29-Jul-1961) on 12/25/22 at  5:00 PM EST by a video-enabled telemedicine application and verified that I am speaking with the correct person using two identifiers.  Location: Patient: Virtual Visit Location Patient: Home Provider: Virtual Visit Location Provider: Mobile   I discussed the limitations of  evaluation and management by telemedicine and the availability of in person appointments. The patient expressed understanding and agreed to proceed.    History of Present Illness: Daniel Crane is a 61 y.o. who identifies as a male who was assigned male at birth, and is being seen today for flu .  HPI: Patient went  to ED Monday and tested positive for flu. Refused tamiflu there. He thought they were talking about covid medicine.  Influenza This is a new problem. Episode onset: monday. The problem occurs intermittently. The problem has been gradually worsening. Associated symptoms include chills, congestion, coughing, a fever and myalgias. Nothing aggravates the symptoms. He has tried acetaminophen (nyquil) for the symptoms. The treatment provided mild relief.    Review of Systems  Constitutional:  Positive for chills and fever.  HENT:  Positive for congestion.   Respiratory:  Positive for cough.   Musculoskeletal:  Positive for myalgias.    Problems:  Patient Active Problem List   Diagnosis Date Noted   Leukocytosis 11/01/2022   Hepatic steatosis 11/01/2022   Acute blood loss anemia 11/01/2022   Acute lower GI bleeding 11/01/2022   GI bleed 10/31/2022   Elevated LFTs 08/19/2022   Essential hypertension 11/26/2021   Mixed hyperlipidemia 04/01/2017   Insomnia 11/29/2016   History of colonic polyps    Diverticulosis of colon without hemorrhage    Encounter for screening colonoscopy  09/13/2015    Allergies: No Known Allergies Medications:  Current Outpatient Medications:    amLODipine (NORVASC) 5 MG tablet, Take 1 tablet (5 mg total) by mouth daily., Disp: 90 tablet, Rfl: 3   aspirin EC 81 MG tablet, Take 1 tablet (81 mg total) by mouth daily with breakfast., Disp: 30 tablet, Rfl: 11   Cyanocobalamin (VITAMIN B12 PO), Take 1 tablet by mouth daily., Disp: , Rfl:    cyclobenzaprine (FLEXERIL) 10 MG tablet, Take 10 mg by mouth at bedtime., Disp: , Rfl:    MAGNESIUM PO, Take 1  tablet by mouth See admin instructions. Take 1 tablet in the morning and 2 tablets every evening, Disp: , Rfl:    Milk Thistle 1000 MG CAPS, Take 1 tablet by mouth every evening., Disp: , Rfl:    Multiple Vitamins-Minerals (MULTIVITAMIN WITH MINERALS) tablet, Take 1 tablet by mouth daily., Disp: , Rfl:    Omega-3 Fatty Acids (FISH OIL PO), Take 1 capsule by mouth daily., Disp: , Rfl:    rosuvastatin (CRESTOR) 20 MG tablet, Take 1 tablet (20 mg total) by mouth daily. (Patient taking differently: Take 10 mg by mouth daily.), Disp: 90 tablet, Rfl: 3   traZODone (DESYREL) 50 MG tablet, Take 1 tablet (50 mg total) by mouth at bedtime., Disp: 90 tablet, Rfl: 3   Turmeric 400 MG CAPS, Take 400 mg by mouth 2 (two) times daily. , Disp: , Rfl:    vitamin C (ASCORBIC ACID) 500 MG tablet, Take 500 mg by mouth daily., Disp: , Rfl:    zinc gluconate 50 MG tablet, Take 50 mg by mouth daily., Disp: , Rfl:   Observations/Objective: Patient is well-developed, well-nourished in no acute distress.  Resting comfortably  at home.  Head is normocephalic, atraumatic.  No labored breathing.  Speech is clear and coherent with logical content.  Patient is alert and oriented at baseline.  Voice raspy Dry cough  Assessment and Plan:  [name] in today with chief complaint of No chief complaint on file.   1. Influenza A 1. Take meds as prescribed 2. Use a cool mist humidifier especially during the winter months and when heat has been humid. 3. Use saline nose sprays frequently 4. Saline irrigations of the nose can be very helpful if done frequently.  * 4X daily for 1 week*  * Use of a nettie pot can be helpful with this. Follow directions with this* 5. Drink plenty of fluids 6. Keep thermostat turn down low 7.For any cough or congestion- 8. For fever or aces or pains- take tylenol or ibuprofen appropriate for age and weight.  * for fevers greater than 101 orally you may alternate ibuprofen and tylenol  every  3 hours.   Meds ordered this encounter  Medications   oseltamivir (TAMIFLU) 75 MG capsule    Sig: Take 1 capsule (75 mg total) by mouth 2 (two) times daily.    Dispense:  10 capsule    Refill:  0    Order Specific Question:   Supervising Provider    Answer:   Caryl Pina A [[id number]]   promethazine-dextromethorphan (PROMETHAZINE-DM) 6.25-15 MG/5ML syrup    Sig: Take 5 mLs by mouth 4 (four) times daily as needed.    Dispense:  118 mL    Refill:  0    Order Specific Question:   Supervising Provider    Answer:   Caryl Pina A [[id number]]      Follow Up Instructions: I discussed the assessment and  treatment plan with the patient. The patient was provided an opportunity to ask questions and all were answered. The patient agreed with the plan and demonstrated an understanding of the instructions.  A copy of instructions were sent to the patient via MyChart.  The patient was advised to call back or seek an in-person evaluation if the symptoms worsen or if the condition fails to improve as anticipated.  Time:  I spent 18 minutes with the patient via telehealth technology discussing the above problems/concerns.    Mary-Margaret Hassell Done, FNP

## 2022-12-25 NOTE — Patient Instructions (Signed)

## 2022-12-31 ENCOUNTER — Telehealth: Payer: Self-pay | Admitting: Family Medicine

## 2022-12-31 NOTE — Telephone Encounter (Signed)
  Incoming Patient Call  12/31/2022  What symptoms do you have? Light headed, weakness, sweat and out of breath with exertion  How long have you been sick? Since 12/23/2022  Have you been seen for this problem? Yes televisit 12/25/2022 Has finished taking medication and still no better   If your provider decides to give you a prescription, which pharmacy would you like for it to be sent to? [facility]    Patient informed that this information will be sent to the clinical staff for review and that they should receive a follow up call.

## 2022-12-31 NOTE — Telephone Encounter (Signed)
Pt aware of provider feedback and advised we don't have any openings for today but pt decided to just hold off and will go to ED if needed.

## 2022-12-31 NOTE — Telephone Encounter (Signed)
Needs fae to face visit with PCP

## 2023-01-13 ENCOUNTER — Ambulatory Visit: Payer: No Typology Code available for payment source | Admitting: Family Medicine

## 2023-02-13 ENCOUNTER — Ambulatory Visit: Payer: No Typology Code available for payment source | Admitting: Family Medicine

## 2023-02-13 ENCOUNTER — Encounter: Payer: Self-pay | Admitting: Family Medicine

## 2023-02-13 VITALS — BP 134/80 | HR 76 | Ht 76.0 in | Wt 314.0 lb

## 2023-02-13 DIAGNOSIS — E782 Mixed hyperlipidemia: Secondary | ICD-10-CM | POA: Diagnosis not present

## 2023-02-13 DIAGNOSIS — Z125 Encounter for screening for malignant neoplasm of prostate: Secondary | ICD-10-CM

## 2023-02-13 DIAGNOSIS — Z8719 Personal history of other diseases of the digestive system: Secondary | ICD-10-CM

## 2023-02-13 DIAGNOSIS — G4700 Insomnia, unspecified: Secondary | ICD-10-CM | POA: Diagnosis not present

## 2023-02-13 DIAGNOSIS — I10 Essential (primary) hypertension: Secondary | ICD-10-CM | POA: Diagnosis not present

## 2023-02-13 LAB — LIPID PANEL

## 2023-02-13 MED ORDER — PANTOPRAZOLE SODIUM 40 MG PO TBEC: 40.0000 mg | DELAYED_RELEASE_TABLET | Freq: Every day | ORAL | 3 refills | Status: DC

## 2023-02-13 NOTE — Progress Notes (Signed)
BP 134/80   Pulse 76   Ht 6' 4"$  (1.93 m)   Wt (!) 314 lb (142.4 kg)   SpO2 98%   BMI 38.22 kg/m    Subjective:   Patient ID: Daniel Crane, male    DOB: Sep 03, 1961, 62 y.o.   MRN: [id number]  HPI: Daniel Crane is a 62 y.o. male presenting on 02/13/2023 for Medical Management of Chronic Issues, Hyperlipidemia, and Hypertension   HPI Hypertension Patient is currently on amlodipine, and their blood pressure today is 144/78. Patient denies any lightheadedness or dizziness. Patient denies headaches, blurred vision, chest pains, shortness of breath, or weakness. Denies any side effects from medication and is content with current medication.   Hyperlipidemia Patient is coming in for recheck of his hyperlipidemia. The patient is currently taking Crestor and fish oils. They deny any issues with myalgias or history of liver damage from it. They deny any focal numbness or weakness or chest pain.   Insomnia recheck Patient is coming in today for insomnia recheck.  Patient currently takes trazodone.  Trazodone seems to do well and he seems to sleep well.  He says his stress is also been reduced a lot because of the change in job which has helped a lot.  Patient has a history of a GI bleed back in December and had a ileus secondary to it.  He has anemia has recovered but since then he has been taking pantoprazole and would like a refill for it.  He says its helped him a lot with his abdominal pain and he has not had any bleeding since that time.  Relevant past medical, surgical, family and social history reviewed and updated as indicated. Interim medical history since our last visit reviewed. Allergies and medications reviewed and updated.  Review of Systems  Constitutional:  Negative for chills and fever.  Eyes:  Negative for visual disturbance.  Respiratory:  Negative for shortness of breath and wheezing.   Cardiovascular:  Negative for chest pain and leg swelling.  Musculoskeletal:   Negative for back pain and gait problem.  Skin:  Negative for rash.  Neurological:  Negative for dizziness, weakness and light-headedness.  All other systems reviewed and are negative.   Per HPI unless specifically indicated above   Allergies as of 02/13/2023   No Known Allergies      Medication List        Accurate as of February 13, 2023 11:43 AM. If you have any questions, ask your nurse or doctor.          STOP taking these medications    oseltamivir 75 MG capsule Commonly known as: Tamiflu Stopped by: Worthy Rancher, MD   promethazine-dextromethorphan 6.25-15 MG/5ML syrup Commonly known as: PROMETHAZINE-DM Stopped by: [name] Dorrie Cocuzza, MD       TAKE these medications    amLODipine 5 MG tablet Commonly known as: NORVASC Take 1 tablet (5 mg total) by mouth daily.   ascorbic acid 500 MG tablet Commonly known as: VITAMIN C Take 500 mg by mouth daily.   aspirin EC 81 MG tablet Take 1 tablet (81 mg total) by mouth daily with breakfast.   cyclobenzaprine 10 MG tablet Commonly known as: FLEXERIL Take 10 mg by mouth at bedtime.   FISH OIL PO Take 1 capsule by mouth daily.   MAGNESIUM PO Take 1 tablet by mouth See admin instructions. Take 1 tablet in the morning and 2 tablets every evening   Milk Thistle 1000 MG  Caps Take 1 tablet by mouth every evening.   multivitamin with minerals tablet Take 1 tablet by mouth daily.   pantoprazole 40 MG tablet Commonly known as: PROTONIX Take 1 tablet (40 mg total) by mouth daily. Started by: [name] Maksim Peregoy, MD   rosuvastatin 20 MG tablet Commonly known as: CRESTOR Take 1 tablet (20 mg total) by mouth daily. What changed: how much to take   traZODone 50 MG tablet Commonly known as: DESYREL Take 1 tablet (50 mg total) by mouth at bedtime.   Turmeric 400 MG Caps Take 400 mg by mouth 2 (two) times daily.   VITAMIN B12 PO Take 1 tablet by mouth daily.   zinc gluconate 50 MG tablet Take 50 mg by  mouth daily.         Objective:   BP 134/80   Pulse 76   Ht 6' 4"$  (1.93 m)   Wt (!) 314 lb (142.4 kg)   SpO2 98%   BMI 38.22 kg/m   Wt Readings from Last 3 Encounters:  02/13/23 (!) 314 lb (142.4 kg)  12/23/22 (!) 328 lb (148.8 kg)  12/13/22 (!) 320 lb 3.2 oz (145.2 kg)    Physical Exam Vitals and nursing note reviewed.  Constitutional:      General: He is not in acute distress.    Appearance: He is well-developed. He is not diaphoretic.  Eyes:     General: No scleral icterus.    Conjunctiva/sclera: Conjunctivae normal.  Neck:     Thyroid: No thyromegaly.  Cardiovascular:     Rate and Rhythm: Normal rate and regular rhythm.     Heart sounds: Normal heart sounds. No murmur heard. Pulmonary:     Effort: Pulmonary effort is normal. No respiratory distress.     Breath sounds: Normal breath sounds. No wheezing.  Musculoskeletal:        General: No swelling. Normal range of motion.     Cervical back: Neck supple.  Lymphadenopathy:     Cervical: No cervical adenopathy.  Skin:    General: Skin is warm and dry.     Findings: No rash.  Neurological:     Mental Status: He is alert and oriented to person, place, and time.     Coordination: Coordination normal.  Psychiatric:        Behavior: Behavior normal.       Assessment & Plan:   Problem List Items Addressed This Visit       Cardiovascular and Mediastinum   Essential hypertension   Relevant Orders   CBC with Differential/Platelet   CMP14+EGFR     Other   Insomnia - Primary   Relevant Orders   CBC with Differential/Platelet   Mixed hyperlipidemia   Relevant Orders   CBC with Differential/Platelet   CMP14+EGFR   Lipid panel   Other Visit Diagnoses     Prostate cancer screening       Relevant Orders   PSA, total and free   History of GI bleed       Relevant Medications   pantoprazole (PROTONIX) 40 MG tablet     Has not had any further bleeding, will check blood counts.  Will also refill his  pantoprazole that he continue with that.  Follow up plan: Return in about 6 months (around 08/14/2023), or if symptoms worsen or fail to improve, for Physical exam and hypertension and cholesterol.  Counseling provided for all of the vaccine components Orders Placed This Encounter  Procedures   CBC with  Differential/Platelet   CMP14+EGFR   Lipid panel   PSA, total and free    Caryl Pina, MD [facility] 02/13/2023, 11:43 AM

## 2023-02-14 LAB — CBC WITH DIFFERENTIAL/PLATELET
Basophils Absolute: 0.1 10*3/uL (ref 0.0–0.2)
Basos: 1 %
EOS (ABSOLUTE): 0.4 10*3/uL (ref 0.0–0.4)
Eos: 6 %
Hematocrit: 43.4 % (ref 37.5–51.0)
Hemoglobin: 14.8 g/dL (ref 13.0–17.7)
Immature Grans (Abs): 0 10*3/uL (ref 0.0–0.1)
Immature Granulocytes: 0 %
Lymphocytes Absolute: 1.9 10*3/uL (ref 0.7–3.1)
Lymphs: 30 %
MCH: 30.5 pg (ref 26.6–33.0)
MCHC: 34.1 g/dL (ref 31.5–35.7)
MCV: 89 fL (ref 79–97)
Monocytes Absolute: 0.7 10*3/uL (ref 0.1–0.9)
Monocytes: 11 %
Neutrophils Absolute: 3.3 10*3/uL (ref 1.4–7.0)
Neutrophils: 52 %
Platelets: 268 10*3/uL (ref 150–450)
RBC: 4.86 x10E6/uL (ref 4.14–5.80)
RDW: 13.3 % (ref 11.6–15.4)
WBC: 6.3 10*3/uL (ref 3.4–10.8)

## 2023-02-14 LAB — PSA, TOTAL AND FREE
PSA, Free Pct: 23.7 %
PSA, Free: 0.45 ng/mL
Prostate Specific Ag, Serum: 1.9 ng/mL (ref 0.0–4.0)

## 2023-02-14 LAB — CMP14+EGFR
ALT: 52 IU/L — ABNORMAL HIGH (ref 0–44)
AST: 41 IU/L — ABNORMAL HIGH (ref 0–40)
Albumin/Globulin Ratio: 1.6 (ref 1.2–2.2)
Albumin: 4.2 g/dL (ref 3.9–4.9)
Alkaline Phosphatase: 74 IU/L (ref 44–121)
BUN/Creatinine Ratio: 16 (ref 10–24)
BUN: 13 mg/dL (ref 8–27)
Bilirubin Total: 0.4 mg/dL (ref 0.0–1.2)
CO2: 20 mmol/L (ref 20–29)
Calcium: 8.8 mg/dL (ref 8.6–10.2)
Chloride: 105 mmol/L (ref 96–106)
Creatinine, Ser: 0.8 mg/dL (ref 0.76–1.27)
Globulin, Total: 2.6 g/dL (ref 1.5–4.5)
Glucose: 108 mg/dL — ABNORMAL HIGH (ref 70–99)
Potassium: 4 mmol/L (ref 3.5–5.2)
Sodium: 142 mmol/L (ref 134–144)
Total Protein: 6.8 g/dL (ref 6.0–8.5)
eGFR: 101 mL/min/{1.73_m2} (ref >59)

## 2023-02-14 LAB — LIPID PANEL
Chol/HDL Ratio: 2.7 ratio (ref 0.0–5.0)
Cholesterol, Total: 137 mg/dL (ref 100–199)
HDL: 50 mg/dL (ref >39)
LDL Chol Calc (NIH): 68 mg/dL (ref 0–99)
Triglycerides: 100 mg/dL (ref 0–149)
VLDL Cholesterol Cal: 19 mg/dL (ref 5–40)

## 2023-06-11 ENCOUNTER — Encounter: Payer: Self-pay | Admitting: Internal Medicine

## 2023-06-30 IMAGING — DX DG FOOT COMPLETE 3+V*R*
3 series · 3 of 3 positions shown · non-contrast
Comparison: None.

CLINICAL DATA: Foot pain and swelling

EXAM:
RIGHT FOOT COMPLETE - 3+ VIEW

[foot ap]
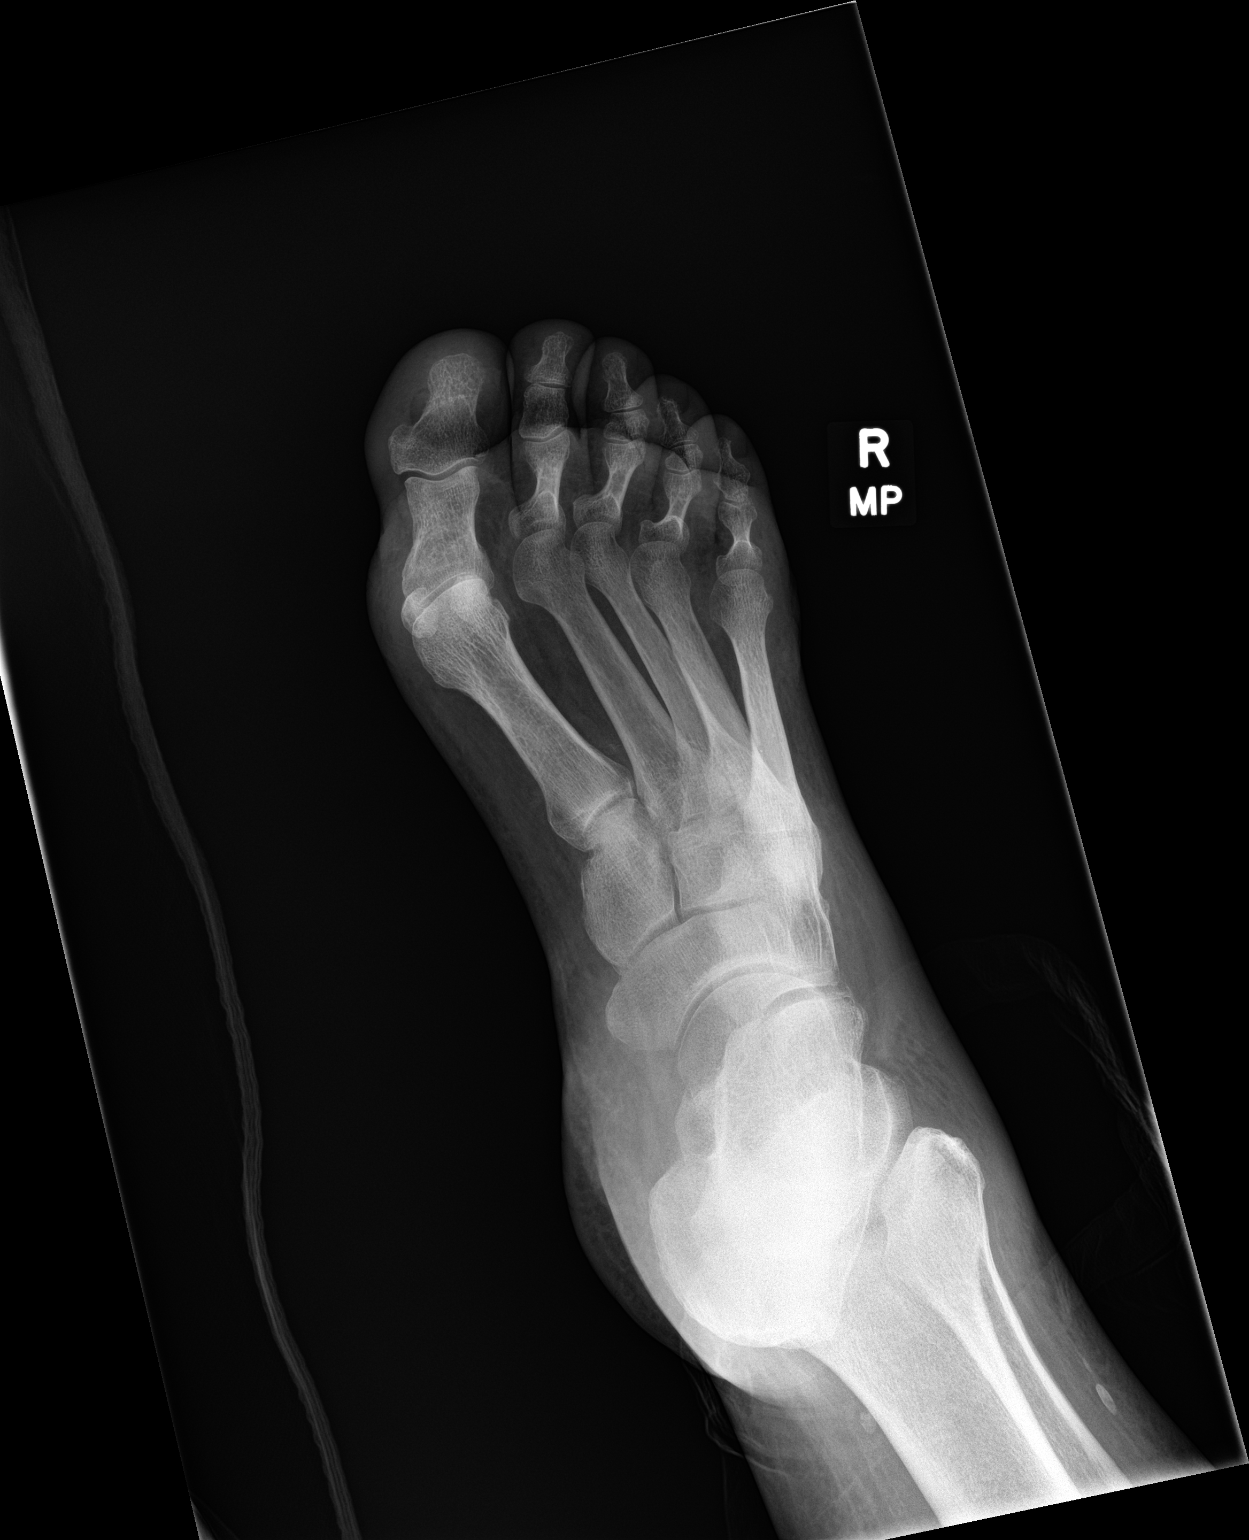

[foot obl]
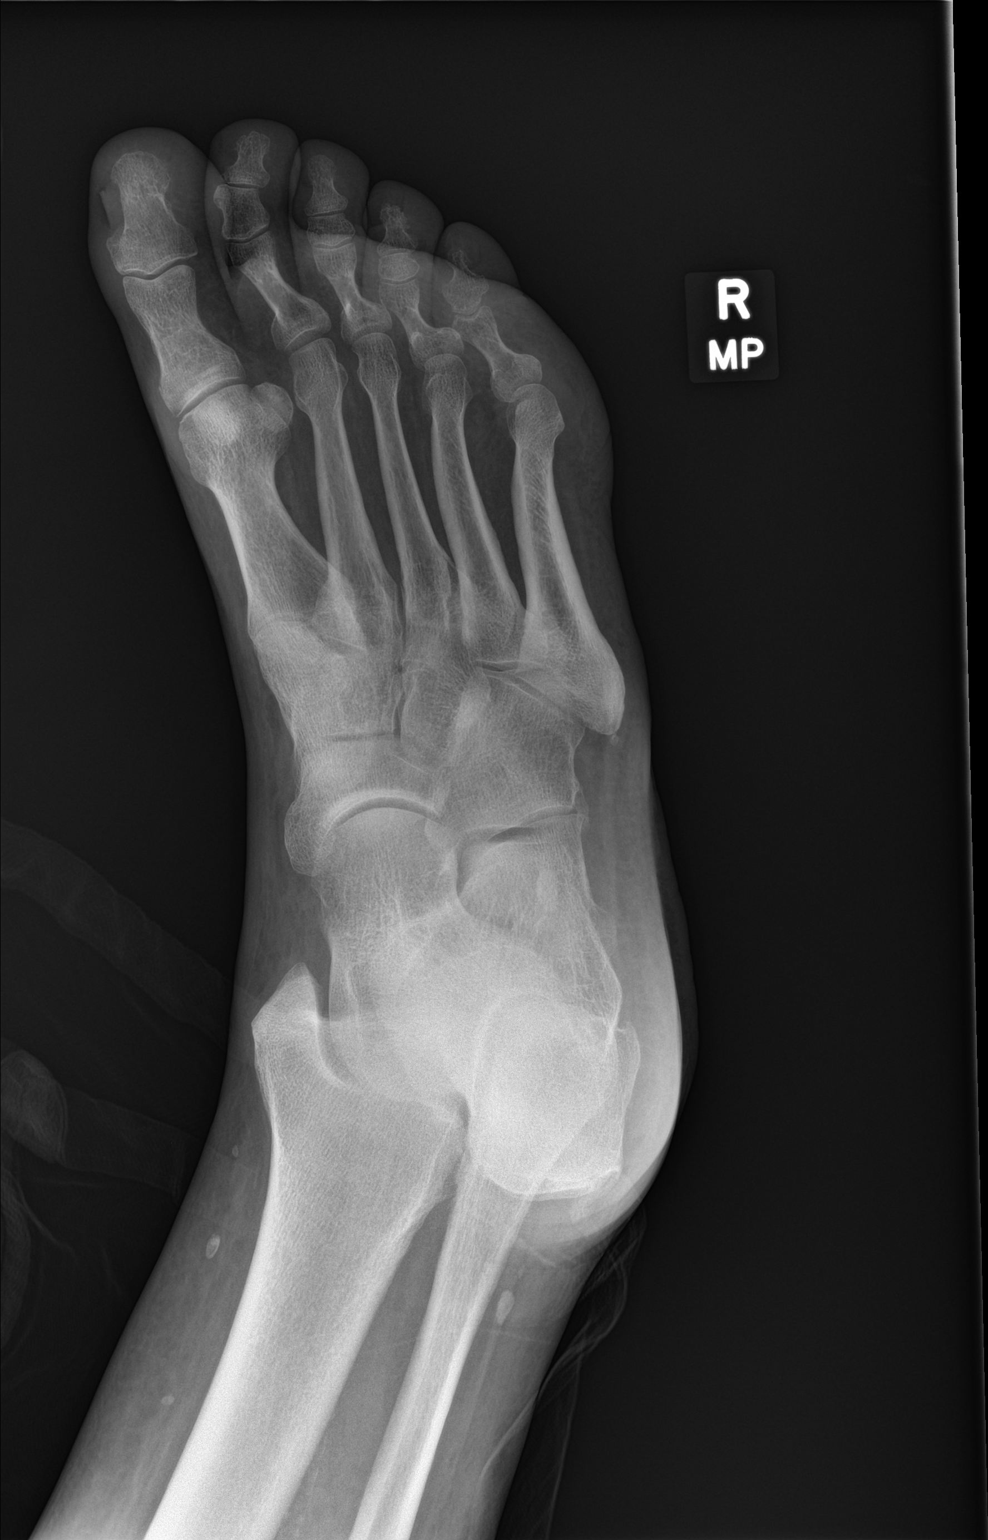

[foot lat]
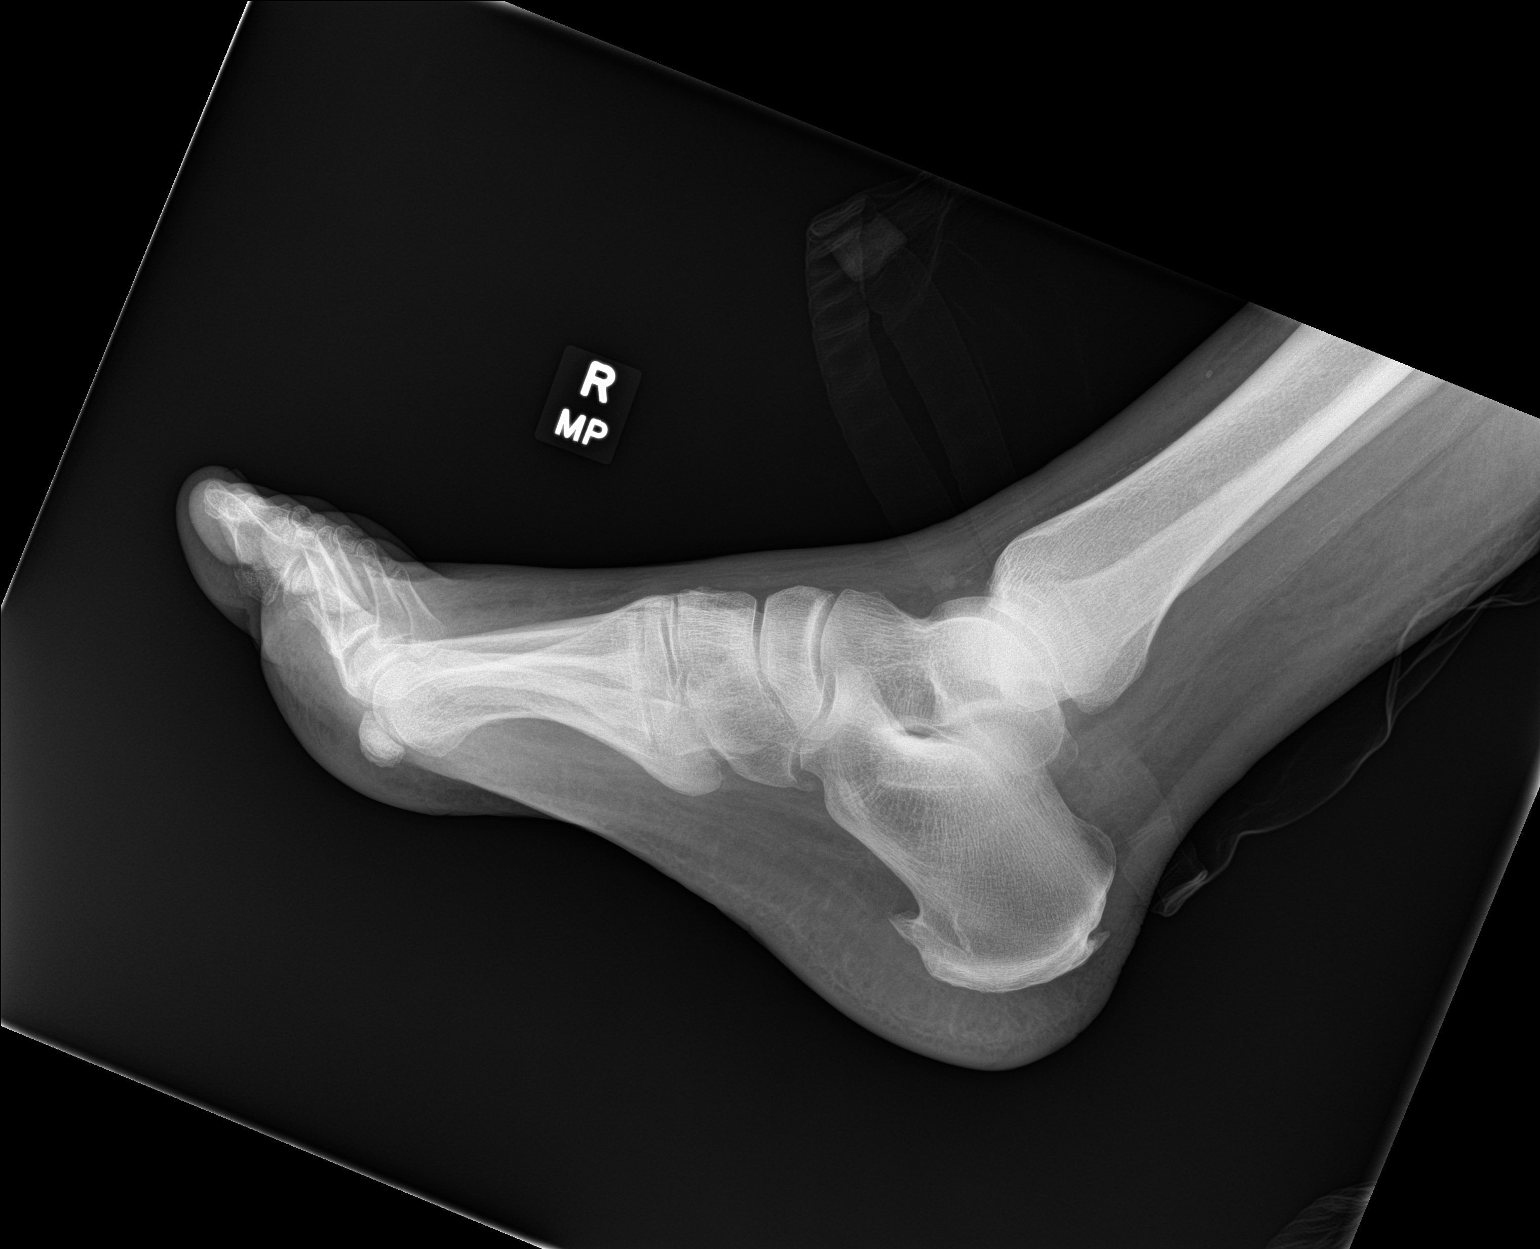

[3 of 3 positions shown; findings below may reference images not displayed]

FINDINGS: No acute fracture or dislocation identified. Joint spaces appear
preserved. Prominent plantar and posterior calcaneal spurs. No
significant soft tissue swelling visualized.
IMPRESSION: No acute osseous abnormality identified.

## 2023-08-04 ENCOUNTER — Other Ambulatory Visit: Payer: Self-pay | Admitting: Family Medicine

## 2023-08-04 DIAGNOSIS — I10 Essential (primary) hypertension: Secondary | ICD-10-CM

## 2023-08-04 DIAGNOSIS — G4700 Insomnia, unspecified: Secondary | ICD-10-CM

## 2023-08-20 ENCOUNTER — Encounter: Payer: Self-pay | Admitting: Family Medicine

## 2023-08-20 ENCOUNTER — Encounter: Payer: No Typology Code available for payment source | Admitting: Family Medicine

## 2023-08-20 DIAGNOSIS — I10 Essential (primary) hypertension: Secondary | ICD-10-CM

## 2023-08-20 DIAGNOSIS — G4700 Insomnia, unspecified: Secondary | ICD-10-CM

## 2023-08-20 DIAGNOSIS — E785 Hyperlipidemia, unspecified: Secondary | ICD-10-CM

## 2023-08-26 ENCOUNTER — Encounter: Payer: Self-pay | Admitting: Internal Medicine

## 2023-09-24 ENCOUNTER — Other Ambulatory Visit: Payer: Self-pay | Admitting: Family Medicine

## 2023-09-24 DIAGNOSIS — G4700 Insomnia, unspecified: Secondary | ICD-10-CM

## 2023-09-26 ENCOUNTER — Encounter: Payer: Self-pay | Admitting: Gastroenterology

## 2023-09-26 ENCOUNTER — Ambulatory Visit (INDEPENDENT_AMBULATORY_CARE_PROVIDER_SITE_OTHER): Payer: No Typology Code available for payment source | Admitting: Gastroenterology

## 2023-09-26 VITALS — BP 158/81 | HR 73 | Temp 97.6°F | Ht 76.0 in | Wt 327.6 lb

## 2023-09-26 DIAGNOSIS — K760 Fatty (change of) liver, not elsewhere classified: Secondary | ICD-10-CM | POA: Diagnosis not present

## 2023-09-26 DIAGNOSIS — R7989 Other specified abnormal findings of blood chemistry: Secondary | ICD-10-CM | POA: Diagnosis not present

## 2023-09-26 NOTE — Patient Instructions (Signed)
Complete labs at [facility] at your convenience. We have one located at [address] in [place]. We will continue to monitor your fatty liver. There is medication available if you develop stage three fibrosis in the future. We will monitor you with labs and ultrasounds.  Return office visit in one year.   Instructions for fatty liver: Recommend 1-2# weight loss per week until ideal body weight through exercise & diet. Low fat/cholesterol diet.   Avoid sweets, sodas, fruit juices, sweetened beverages like tea, etc. Gradually increase exercise from 15 min daily up to 1 hr per day 5 days/week. Limit alcohol use.

## 2023-09-26 NOTE — Progress Notes (Signed)
GI Office Note    Referring Provider: Dettinger, Elige Radon, MD Primary Care Physician:  Dettinger, Elige Radon, MD  Primary Gastroenterologist:Michael Jena Gauss, MD   Chief Complaint   Chief Complaint  Patient presents with   Follow-up    History of Present Illness   Daniel Crane is a 62 y.o. male presenting today for follow up. Last seen 11/2022. H/o chronic intermittent elevated LFTs, fatty liver.   Work up of elevated LFTs 2023 with negative celiac screen, autoimmune liver serologies negative, HCV RNA negative, Hep B surface Ag negative, no iron overload. U/S 07/2022 with probable steatosis.  BMI 39.88. Total cholesterol 137, LDL 68 back in 01/2023.   Feels well. No abdominal pain. BMs regular. No melena, brbpr. Started on PPI within the past year with good control of reflux. No dysphagia.   Colonoscopy 11/01/2022: - Preparation of the colon was fair. - Blood in the entire examined colon. - Foreign body in the sigmoid colon. - Three 2 to 4 mm polyps in the sigmoid colon and in the descending colon. - The distal rectum and anal verge are normal on retroflexion view. - No specimens collected. - surveillance colonoscopy 3 years   Colonoscopy 10/31/2022: -6 polyps removed from the transverse, descending and sigmoid segments 4 to 8 mm cold snared. - Diverticulosis in the sigmoid colon and in the descending colon. -Single 1.2 cm likely polyp in his mid sigmoid attempted hot snare but was cold snare due to equipment malfunction. Clip placed. -Normal hemorrhoids -tubular adenomas -next surveillance in 3 years     Medications   Current Outpatient Medications  Medication Sig Dispense Refill   amLODipine (NORVASC) 5 MG tablet Take 1 tablet by mouth once daily 90 tablet 0   aspirin EC 81 MG tablet Take 1 tablet (81 mg total) by mouth daily with breakfast. 30 tablet 11   Cyanocobalamin (VITAMIN B12 PO) Take 1 tablet by mouth daily.     cyclobenzaprine (FLEXERIL) 10 MG tablet  Take 10 mg by mouth at bedtime.     MAGNESIUM PO Take 1 tablet by mouth See admin instructions. Take 1 tablet in the morning and 2 tablets every evening     Milk Thistle 1000 MG CAPS Take 1 tablet by mouth every evening.     Multiple Vitamins-Minerals (MULTIVITAMIN WITH MINERALS) tablet Take 1 tablet by mouth daily.     Omega-3 Fatty Acids (FISH OIL PO) Take 1 capsule by mouth daily.     pantoprazole (PROTONIX) 40 MG tablet Take 1 tablet (40 mg total) by mouth daily. 90 tablet 3   rosuvastatin (CRESTOR) 20 MG tablet Take 1 tablet (20 mg total) by mouth daily. (Patient taking differently: Take 10 mg by mouth daily.) 90 tablet 3   traZODone (DESYREL) 50 MG tablet TAKE 1 TABLET BY MOUTH AT BEDTIME 30 tablet 1   Turmeric 400 MG CAPS Take 400 mg by mouth 2 (two) times daily.      vitamin C (ASCORBIC ACID) 500 MG tablet Take 500 mg by mouth daily.     zinc gluconate 50 MG tablet Take 50 mg by mouth daily.     No current facility-administered medications for this visit.    Allergies   Allergies as of 09/26/2023   (No Known Allergies)        Review of Systems   General: Negative for anorexia, weight loss, fever, chills, fatigue, weakness. ENT: Negative for hoarseness, difficulty swallowing , nasal congestion. CV: Negative for chest pain, angina,  palpitations, dyspnea on exertion, peripheral edema.  Respiratory: Negative for dyspnea at rest, dyspnea on exertion, cough, sputum, wheezing.  GI: See history of present illness. GU:  Negative for dysuria, hematuria, urinary incontinence, urinary frequency, nocturnal urination.  Endo: Negative for unusual weight change.     Physical Exam   BP (!) 158/81 (BP Location: Right Arm, Patient Position: Sitting, Cuff Size: Large)   Pulse 73   Temp 97.6 F (36.4 C) (Oral)   Ht 6\' 4"  (1.93 m)   Wt (!) 327 lb 9.6 oz (148.6 kg)   SpO2 95%   BMI 39.88 kg/m    General: Well-nourished, well-developed in no acute distress.  Eyes: No icterus. Mouth:  Oropharyngeal mucosa moist and pink   Abdomen: Bowel sounds are normal, nontender, nondistended, no hepatosplenomegaly or masses,  no abdominal bruits or hernia , no rebound or guarding. Limited due to body habitus.  Rectal: not performed  Extremities: No lower extremity edema. No clubbing or deformities. Neuro: Alert and oriented x 4   Skin: Warm and dry, no jaundice.   Psych: Alert and cooperative, normal mood and affect.  Labs   Lab Results  Component Value Date   NA 142 02/13/2023   CL 105 02/13/2023   K 4.0 02/13/2023   CO2 20 02/13/2023   BUN 13 02/13/2023   CREATININE 0.80 02/13/2023   EGFR 101 02/13/2023   CALCIUM 8.8 02/13/2023   PHOS 3.5 11/01/2022   ALBUMIN 4.2 02/13/2023   GLUCOSE 108 (H) 02/13/2023   Lab Results  Component Value Date   ALT 52 (H) 02/13/2023   AST 41 (H) 02/13/2023   ALKPHOS 74 02/13/2023   BILITOT 0.4 02/13/2023   Lab Results  Component Value Date   WBC 6.3 02/13/2023   HGB 14.8 02/13/2023   HCT 43.4 02/13/2023   MCV 89 02/13/2023   PLT 268 02/13/2023   Lab Results  Component Value Date   INR 1.0 11/02/2022    Imaging Studies   No results found.  Assessment   *Fatty liver *Elevated LFTs  Discussed fatty liver today. Need for weight loss towards more healthy weight. Increase physical activity. Cholesterol better managed. We briefly discussed medication available for more advanced steatohepatitis with F3 disease, therefore we recommend ongoing monitoring.     PLAN   Labs to include NASH fibrosis score. Instructions for NASH/diet included.  Congratulated him on improved management of his cholesterol.  Return ov in one year.   Leanna Battles. Melvyn Neth, MHS, PA-C [facility]

## 2023-09-27 ENCOUNTER — Other Ambulatory Visit: Payer: Self-pay | Admitting: Family Medicine

## 2023-09-27 DIAGNOSIS — E785 Hyperlipidemia, unspecified: Secondary | ICD-10-CM

## 2023-10-27 ENCOUNTER — Other Ambulatory Visit: Payer: Self-pay | Admitting: Family Medicine

## 2023-10-27 DIAGNOSIS — E785 Hyperlipidemia, unspecified: Secondary | ICD-10-CM

## 2023-11-04 LAB — CBC WITH DIFFERENTIAL/PLATELET
Basophils Absolute: 0 10*3/uL (ref 0.0–0.2)
Basos: 0 %
EOS (ABSOLUTE): 0.3 10*3/uL (ref 0.0–0.4)
Eos: 5 %
Hematocrit: 46.7 % (ref 37.5–51.0)
Hemoglobin: 15.8 g/dL (ref 13.0–17.7)
Immature Grans (Abs): 0 10*3/uL (ref 0.0–0.1)
Immature Granulocytes: 0 %
Lymphocytes Absolute: 2 10*3/uL (ref 0.7–3.1)
Lymphs: 30 %
MCH: 31.9 pg (ref 26.6–33.0)
MCHC: 33.8 g/dL (ref 31.5–35.7)
MCV: 94 fL (ref 79–97)
Monocytes Absolute: 0.7 10*3/uL (ref 0.1–0.9)
Monocytes: 10 %
Neutrophils Absolute: 3.6 10*3/uL (ref 1.4–7.0)
Neutrophils: 55 %
Platelets: 249 10*3/uL (ref 150–450)
RBC: 4.95 x10E6/uL (ref 4.14–5.80)
RDW: 13 % (ref 11.6–15.4)
WBC: 6.7 10*3/uL (ref 3.4–10.8)

## 2023-11-04 LAB — NASH FIBROSURE(R) PLUS
ALPHA 2-MACROGLOBULINS, QN: 370 mg/dL — ABNORMAL HIGH (ref 110–276)
ALT (SGPT) P5P: 65 IU/L — ABNORMAL HIGH (ref 0–55)
AST (SGOT) P5P: 43 IU/L — ABNORMAL HIGH (ref 0–40)
Apolipoprotein A-1: 163 mg/dL (ref 101–178)
Bilirubin, Total: 0.4 mg/dL (ref 0.0–1.2)
Cholesterol, Total: 173 mg/dL (ref 100–199)
Fibrosis Score: 0.56 — ABNORMAL HIGH (ref 0.00–0.21)
GGT: 39 IU/L (ref 0–65)
Glucose: 102 mg/dL — ABNORMAL HIGH (ref 70–99)
Haptoglobin: 87 mg/dL (ref 32–363)
NASH Score: 0 (ref 0.00–0.25)
Steatosis Score: 0.4 (ref 0.00–0.40)
Triglycerides: 155 mg/dL — ABNORMAL HIGH (ref 0–149)

## 2023-11-04 LAB — PROTIME-INR
INR: 1 (ref 0.9–1.2)
Prothrombin Time: 10.9 s (ref 9.1–12.0)

## 2023-11-04 LAB — HEPATIC FUNCTION PANEL
ALT: 56 [IU]/L — ABNORMAL HIGH (ref 0–44)
AST: 38 [IU]/L (ref 0–40)
Albumin: 4.4 g/dL (ref 3.9–4.9)
Alkaline Phosphatase: 81 [IU]/L (ref 44–121)
Bilirubin Total: 0.6 mg/dL (ref 0.0–1.2)
Bilirubin, Direct: 0.19 mg/dL (ref 0.00–0.40)
Total Protein: 7.2 g/dL (ref 6.0–8.5)

## 2023-12-12 ENCOUNTER — Ambulatory Visit (INDEPENDENT_AMBULATORY_CARE_PROVIDER_SITE_OTHER): Payer: No Typology Code available for payment source | Admitting: Family Medicine

## 2023-12-12 ENCOUNTER — Encounter: Payer: Self-pay | Admitting: Family Medicine

## 2023-12-12 VITALS — BP 169/77 | HR 72 | Temp 97.8°F | Ht 76.0 in | Wt 325.0 lb

## 2023-12-12 DIAGNOSIS — M539 Dorsopathy, unspecified: Secondary | ICD-10-CM

## 2023-12-12 MED ORDER — TRAMADOL HCL 50 MG PO TABS
50.0000 mg | ORAL_TABLET | Freq: Two times a day (BID) | ORAL | 2 refills | Status: DC | PRN
Start: 2023-12-12 — End: 2024-03-12

## 2023-12-12 NOTE — Progress Notes (Signed)
BP (!) 169/77   Pulse 72   Temp 97.8 F (36.6 C) (Temporal)   Ht 6\' 4"  (1.93 m)   Wt (!) 325 lb (147.4 kg)   SpO2 97%   BMI 39.56 kg/m    Subjective:   Patient ID: [name], male    DOB: 08-16-1961, 62 y.o.   MRN: [id number]  HPI: Daniel Crane is a 62 y.o. male presenting on 12/12/2023 for Arthritis (All over back is worse )   HPI Back pain and neck pain Patient is coming in for his chronic back pain neck pain it has been gradually worsening over the past year and is gotten the point where he just cannot take it.  Used to be on an oral anti-inflammatory but then he had a GI bleed so he could be on that as of last year.  He is now currently taking Tylenol and not sit and sometimes occasional ibuprofen and is just not cutting it.  He does have a muscle laxer as well that she is not coming in as well.  Did help some but everything is getting worse and is affecting his job.  He denies any pain radiating down his arms or his legs.  He denies any numbness or weakness in his arms or his legs. Pain assessment: Cause of pain-multilevel degenerative disc disease lumbar and cervical Pain location-neck and lower back Pain on scale of 1-10- 8 Frequency-daily What increases pain-working and being on his feet What makes pain Better-not much, tries muscle laxer is not working as well. Effects on ADL -minimal Any change in general medical condition-none  Current opioids rx-none currently but looking to start tramadol. # meds rx- 60 Effectiveness of current meds-just starting Adverse reactions from pain meds- Morphine equivalent- 10  Pill count performed-Yes Last drug screen - n/a ( high risk q62m, moderate risk q75m, low risk yearly ) Urine drug screen today- Yes Was the [facility] reviewed- yes  If yes were their any concerning findings? - none Pain contract signed on: Today  Relevant past medical, surgical, family and social history reviewed and updated as indicated. Interim medical  history since our last visit reviewed. Allergies and medications reviewed and updated.  Review of Systems  Constitutional:  Negative for chills and fever.  Eyes:  Negative for visual disturbance.  Respiratory:  Negative for shortness of breath and wheezing.   Cardiovascular:  Negative for chest pain and leg swelling.  Musculoskeletal:  Negative for back pain and gait problem.  Skin:  Negative for rash.  Neurological:  Negative for dizziness and light-headedness.  All other systems reviewed and are negative.   Per HPI unless specifically indicated above   Allergies as of 12/12/2023   No Known Allergies      Medication List        Accurate as of December 12, 2023  3:11 PM. If you have any questions, ask your nurse or doctor.          amLODipine 5 MG tablet Commonly known as: NORVASC Take 1 tablet by mouth once daily   ascorbic acid 500 MG tablet Commonly known as: VITAMIN C Take 500 mg by mouth daily.   aspirin EC 81 MG tablet Take 1 tablet (81 mg total) by mouth daily with breakfast.   cyclobenzaprine 10 MG tablet Commonly known as: FLEXERIL Take 10 mg by mouth at bedtime.   FISH OIL PO Take 1 capsule by mouth daily.   MAGNESIUM PO Take 1 tablet by mouth  See admin instructions. Take 1 tablet in the morning and 2 tablets every evening   Milk Thistle 1000 MG Caps Take 1 tablet by mouth every evening.   multivitamin with minerals tablet Take 1 tablet by mouth daily.   pantoprazole 40 MG tablet Commonly known as: PROTONIX Take 1 tablet (40 mg total) by mouth daily.   rosuvastatin 20 MG tablet Commonly known as: CRESTOR Take 1 tablet by mouth once daily   traMADol 50 MG tablet Commonly known as: ULTRAM Take 1 tablet (50 mg total) by mouth every 12 (twelve) hours as needed. Started by: Elige Radon Brunette Lavalle   traZODone 50 MG tablet Commonly known as: DESYREL TAKE 1 TABLET BY MOUTH AT BEDTIME   Turmeric 400 MG Caps Take 400 mg by mouth 2 (two) times  daily.   VITAMIN B12 PO Take 1 tablet by mouth daily.   zinc gluconate 50 MG tablet Take 50 mg by mouth daily.         Objective:   BP (!) 169/77   Pulse 72   Temp 97.8 F (36.6 C) (Temporal)   Ht 6\' 4"  (1.93 m)   Wt (!) 325 lb (147.4 kg)   SpO2 97%   BMI 39.56 kg/m   Wt Readings from Last 3 Encounters:  12/12/23 (!) 325 lb (147.4 kg)  09/26/23 (!) 327 lb 9.6 oz (148.6 kg)  02/13/23 (!) 314 lb (142.4 kg)    Physical Exam Vitals and nursing note reviewed.  Constitutional:      General: He is not in acute distress.    Appearance: He is well-developed. He is not diaphoretic.  Eyes:     General: No scleral icterus.    Conjunctiva/sclera: Conjunctivae normal.  Neck:     Thyroid: No thyromegaly.  Musculoskeletal:        General: Normal range of motion.     Cervical back: Neck supple. Spasms and tenderness present. No deformity, erythema or bony tenderness.     Lumbar back: Tenderness present. No swelling, deformity or bony tenderness. Normal range of motion. Negative right straight leg raise test and negative left straight leg raise test.  Lymphadenopathy:     Cervical: No cervical adenopathy.  Skin:    General: Skin is warm and dry.     Findings: No rash.  Neurological:     Mental Status: He is alert and oriented to person, place, and time.     Coordination: Coordination normal.  Psychiatric:        Behavior: Behavior normal.       Assessment & Plan:   Problem List Items Addressed This Visit   None Visit Diagnoses       Multilevel degenerative disc disease    -  Primary   Relevant Medications   traMADol (ULTRAM) 50 MG tablet   Other Relevant Orders   ToxASSURE Select 13 (MW), Urine       X-ray a few years ago showed multilevel degenerative disc disease, will do low-dose tramadol and see if that is enough to manage the symptoms.  If not may consider therapy or referral to specialist in the future. Follow up plan: Return in about 3 months (around  03/11/2024), or if symptoms worsen or fail to improve, for pain and chronic visit.  Counseling provided for all of the vaccine components Orders Placed This Encounter  Procedures   ToxASSURE Select 13 (MW), Urine    Arville Care, MD [facility] 12/12/2023, 3:11 PM

## 2023-12-15 ENCOUNTER — Telehealth: Payer: Self-pay | Admitting: Family Medicine

## 2023-12-15 ENCOUNTER — Other Ambulatory Visit (HOSPITAL_COMMUNITY): Payer: Self-pay

## 2023-12-15 NOTE — Telephone Encounter (Signed)
Spoke with patient and he is aware a prior authorization has been completed and we are waiting on a decision  Romain Kaiser (Key: [id number]) PA Case ID #: [id number] Rx #: [id number] Need Help? Call us at [phone] Status sent iconSent to Plan today Drug traMADol HCl 50MG  tablets ePA cloud logo Form [facility] Electronic Prior Authorization Form (2017 NCPDP) Original Claim Info 4 1st Opiod:QL=5/day&<62yo=3DS,20+yo=7DSMax 7 Days Supply in 7 DaysTotal MME 10.00MG *WMART*[id number]*Please visit [email or web address] for assistance resolvingthe most common patient rejections

## 2023-12-15 NOTE — Telephone Encounter (Signed)
Pharmacy Patient Advocate Encounter  Received notification from [facility] that Prior Authorization for traMADol HCl 50MG  tablets has been CANCELLED due to: This medication or product is on your plan's list of covered drugs. Prior authorization is not required at this time.   PA #/Case ID/Reference #: [id number]   Ran test claim, came back refill too soon. Last filled 12/14/23. Refill Payable on or after 12/20/23.

## 2023-12-15 NOTE — Telephone Encounter (Signed)
Copied from CRM [id number]. Topic: Clinical - Prescription Issue >> Dec 15, 2023  7:44 AM Daniel Crane wrote: Reason for CRM: Patient is calling to report that the pharmacy let him know that the prescription for  traMADol (ULTRAM) 50 MG tablet needs prior authorization - the pharmacy filled it only for 14 pills as a result. Patient did not have any additional information - he was going to call the insurance company today to find out more but thought it would be better if we called to see what's going on.

## 2023-12-16 ENCOUNTER — Other Ambulatory Visit: Payer: Self-pay | Admitting: Family Medicine

## 2023-12-16 DIAGNOSIS — G4700 Insomnia, unspecified: Secondary | ICD-10-CM

## 2023-12-16 DIAGNOSIS — I10 Essential (primary) hypertension: Secondary | ICD-10-CM

## 2023-12-16 DIAGNOSIS — E785 Hyperlipidemia, unspecified: Secondary | ICD-10-CM

## 2024-01-12 ENCOUNTER — Other Ambulatory Visit: Payer: Self-pay

## 2024-01-12 DIAGNOSIS — R7989 Other specified abnormal findings of blood chemistry: Secondary | ICD-10-CM

## 2024-01-12 DIAGNOSIS — K760 Fatty (change of) liver, not elsewhere classified: Secondary | ICD-10-CM

## 2024-02-20 ENCOUNTER — Other Ambulatory Visit: Payer: Self-pay | Admitting: Family Medicine

## 2024-02-20 DIAGNOSIS — I10 Essential (primary) hypertension: Secondary | ICD-10-CM

## 2024-02-20 DIAGNOSIS — M539 Dorsopathy, unspecified: Secondary | ICD-10-CM

## 2024-02-20 MED ORDER — AMLODIPINE BESYLATE 5 MG PO TABS: 5.0000 mg | ORAL_TABLET | Freq: Every day | ORAL | 0 refills | Status: DC

## 2024-02-20 NOTE — Telephone Encounter (Addendum)
Copied from CRM [id number]. Topic: Clinical - Medication Refill >> Feb 20, 2024  9:11 AM Benetta Spar B wrote: Most Recent Primary Care Visit:  Provider: Arville Care A  Department: [facility]  Visit Type: OFFICE VISIT  Date: 12/12/2023  Medication: traMADol (ULTRAM) 50 MG tablet/amLODipine (NORVASC) 5 MG tablet   Has the patient contacted their pharmacy? yes (Agent: If yes, when and what did the pharmacy advise?)contact pcp  Is this the correct pharmacy for this prescription? yes This is the patient's preferred pharmacy:  [facility] [address] [place] - [address] [place] [address] Phone: [phone] Fax: [phone]   Has the prescription been filled recently? no  Is the patient out of the medication? yes  Has the patient been seen for an appointment in the last year OR does the patient have an upcoming appointment? yes  Can we respond through MyChart? yes  Agent: Please be advised that Rx refills may take up to 3 business days. We ask that you follow-up with your pharmacy.

## 2024-02-24 ENCOUNTER — Other Ambulatory Visit: Payer: Self-pay

## 2024-02-24 DIAGNOSIS — R7989 Other specified abnormal findings of blood chemistry: Secondary | ICD-10-CM

## 2024-02-24 DIAGNOSIS — K760 Fatty (change of) liver, not elsewhere classified: Secondary | ICD-10-CM

## 2024-03-12 ENCOUNTER — Ambulatory Visit: Payer: No Typology Code available for payment source | Admitting: Family Medicine

## 2024-03-12 VITALS — BP 141/75 | HR 70 | Ht 76.0 in | Wt 320.0 lb

## 2024-03-12 DIAGNOSIS — M25511 Pain in right shoulder: Secondary | ICD-10-CM

## 2024-03-12 DIAGNOSIS — I10 Essential (primary) hypertension: Secondary | ICD-10-CM

## 2024-03-12 DIAGNOSIS — M539 Dorsopathy, unspecified: Secondary | ICD-10-CM

## 2024-03-12 DIAGNOSIS — Z8719 Personal history of other diseases of the digestive system: Secondary | ICD-10-CM

## 2024-03-12 DIAGNOSIS — E785 Hyperlipidemia, unspecified: Secondary | ICD-10-CM | POA: Diagnosis not present

## 2024-03-12 DIAGNOSIS — E782 Mixed hyperlipidemia: Secondary | ICD-10-CM | POA: Diagnosis not present

## 2024-03-12 DIAGNOSIS — G4700 Insomnia, unspecified: Secondary | ICD-10-CM

## 2024-03-12 MED ORDER — AMLODIPINE BESYLATE 5 MG PO TABS: 5.0000 mg | ORAL_TABLET | Freq: Every day | ORAL | 3 refills | Status: DC

## 2024-03-12 MED ORDER — PANTOPRAZOLE SODIUM 40 MG PO TBEC: 40.0000 mg | DELAYED_RELEASE_TABLET | Freq: Every day | ORAL | 3 refills | Status: AC

## 2024-03-12 MED ORDER — TRAMADOL HCL 50 MG PO TABS: 50.0000 mg | ORAL_TABLET | Freq: Two times a day (BID) | ORAL | 2 refills | Status: DC | PRN

## 2024-03-12 MED ORDER — ROSUVASTATIN CALCIUM 20 MG PO TABS
20.0000 mg | ORAL_TABLET | Freq: Every day | ORAL | 3 refills | Status: AC
Start: 2024-03-12 — End: ?

## 2024-03-12 MED ORDER — TRAZODONE HCL 50 MG PO TABS: 50.0000 mg | ORAL_TABLET | Freq: Every day | ORAL | 3 refills | Status: AC

## 2024-03-12 NOTE — Progress Notes (Signed)
 BP (!) 141/75   Pulse 70   Ht 6\' 4"  (1.93 m)   Wt (!) 320 lb (145.2 kg)   SpO2 97%   BMI 38.95 kg/m    Subjective:   Patient ID: Daniel Crane, male    DOB: [birth year], 63 y.o.   MRN: [id number]  HPI: Daniel Crane is a 63 y.o. male presenting on 03/12/2024 for Medical Management of Chronic Issues, Hyperlipidemia, and Hypertension   HPI Hypertension Patient is currently on amlodipine, and their blood pressure today is 141/75. Patient denies any lightheadedness or dizziness. Patient denies headaches, blurred vision, chest pains, shortness of breath, or weakness. Denies any side effects from medication and is content with current medication.   Hyperlipidemia Patient is coming in for recheck of his hyperlipidemia. The patient is currently taking Crestor and fish oils. They deny any issues with myalgias or history of liver damage from it. They deny any focal numbness or weakness or chest pain.   Insomnia recheck Patient is coming in for insomnia recheck and is currently using trazodone to help with sleep.  Pain assessment: Cause of pain-degenerative disc disease Pain location-lumbar Pain on scale of 1-10- 2 Frequency-most days What increases pain-weather changes and being on his feet. What makes pain Better-tramadol Effects on ADL -minimal Any change in general medical condition-none  Current opioids rx-tramadol 50 mg twice daily as needed # meds rx-60/month Effectiveness of current meds-works well for the most part Adverse reactions from pain meds-none Morphine equivalent-10  Pill count performed-No Last drug screen -12/22/2023 ( high risk q60m, moderate risk q25m, low risk yearly ) Urine drug screen today- No Was the [facility] reviewed-yes  If yes were their any concerning findings? -Somehow they filled the third prescription out of 2 were sent but he has been very consistent about his refills. Pain contract signed on: 12/22/2023  Patient has recently been having some  problems with his right upper back where he feels like he has a knot.  He feels like he was pushed and pulled on some things at work and has been hurting over the past couple weeks since then.  He has used a muscle relaxer and takes his tramadol and he has used some muscle rubs on it it does not seem to be getting better.  Relevant past medical, surgical, family and social history reviewed and updated as indicated. Interim medical history since our last visit reviewed. Allergies and medications reviewed and updated.  Review of Systems  Constitutional:  Negative for chills and fever.  Eyes:  Negative for visual disturbance.  Respiratory:  Negative for shortness of breath and wheezing.   Cardiovascular:  Negative for chest pain and leg swelling.  Musculoskeletal:  Positive for arthralgias, back pain and myalgias. Negative for gait problem.  Skin:  Negative for rash.  All other systems reviewed and are negative.   Per HPI unless specifically indicated above   Allergies as of 03/12/2024   No Known Allergies      Medication List        Accurate as of March 12, 2024  3:38 PM. If you have any questions, ask your nurse or doctor.          amLODipine 5 MG tablet Commonly known as: NORVASC Take 1 tablet (5 mg total) by mouth daily.   ascorbic acid 500 MG tablet Commonly known as: VITAMIN C Take 500 mg by mouth daily.   aspirin EC 81 MG tablet Take 1 tablet (81 mg total) by mouth  daily with breakfast.   cyclobenzaprine 10 MG tablet Commonly known as: FLEXERIL Take 10 mg by mouth at bedtime.   FISH OIL PO Take 1 capsule by mouth daily.   MAGNESIUM PO Take 1 tablet by mouth See admin instructions. Take 1 tablet in the morning and 2 tablets every evening   Milk Thistle 1000 MG Caps Take 1 tablet by mouth every evening.   multivitamin with minerals tablet Take 1 tablet by mouth daily.   pantoprazole 40 MG tablet Commonly known as: PROTONIX Take 1 tablet (40 mg total) by  mouth daily.   rosuvastatin 20 MG tablet Commonly known as: CRESTOR Take 1 tablet (20 mg total) by mouth daily.   traMADol 50 MG tablet Commonly known as: ULTRAM Take 1 tablet (50 mg total) by mouth every 12 (twelve) hours as needed.   traZODone 50 MG tablet Commonly known as: DESYREL Take 1 tablet (50 mg total) by mouth at bedtime.   Turmeric 400 MG Caps Take 400 mg by mouth 2 (two) times daily.   VITAMIN B12 PO Take 1 tablet by mouth daily.   zinc gluconate 50 MG tablet Take 50 mg by mouth daily.         Objective:   BP (!) 141/75   Pulse 70   Ht 6\' 4"  (1.93 m)   Wt (!) 320 lb (145.2 kg)   SpO2 97%   BMI 38.95 kg/m   Wt Readings from Last 3 Encounters:  03/12/24 (!) 320 lb (145.2 kg)  12/12/23 (!) 325 lb (147.4 kg)  09/26/23 (!) 327 lb 9.6 oz (148.6 kg)    Physical Exam Vitals and nursing note reviewed.  Constitutional:      General: He is not in acute distress.    Appearance: He is well-developed. He is not diaphoretic.  Eyes:     General: No scleral icterus.    Conjunctiva/sclera: Conjunctivae normal.  Neck:     Thyroid: No thyromegaly.  Cardiovascular:     Rate and Rhythm: Normal rate and regular rhythm.     Heart sounds: Normal heart sounds. No murmur heard. Pulmonary:     Effort: Pulmonary effort is normal. No respiratory distress.     Breath sounds: Normal breath sounds. No wheezing.  Musculoskeletal:        General: Normal range of motion.       Arms:     Cervical back: Neck supple.  Lymphadenopathy:     Cervical: No cervical adenopathy.  Skin:    General: Skin is warm and dry.     Findings: No rash.  Neurological:     Mental Status: He is alert and oriented to person, place, and time.     Coordination: Coordination normal.  Psychiatric:        Behavior: Behavior normal.       Assessment & Plan:   Problem List Items Addressed This Visit       Cardiovascular and Mediastinum   Essential hypertension   Relevant Medications    amLODipine (NORVASC) 5 MG tablet   rosuvastatin (CRESTOR) 20 MG tablet   Other Relevant Orders   CMP14+EGFR   Lipid panel   CBC with Differential/Platelet     Other   Insomnia   Relevant Medications   traZODone (DESYREL) 50 MG tablet   Mixed hyperlipidemia - Primary   Relevant Medications   amLODipine (NORVASC) 5 MG tablet   rosuvastatin (CRESTOR) 20 MG tablet   Other Relevant Orders   CMP14+EGFR   Lipid panel  CBC with Differential/Platelet   Other Visit Diagnoses       Primary hypertension       Relevant Medications   amLODipine (NORVASC) 5 MG tablet   rosuvastatin (CRESTOR) 20 MG tablet     Hypertension, essential       Relevant Medications   amLODipine (NORVASC) 5 MG tablet   rosuvastatin (CRESTOR) 20 MG tablet     History of GI bleed       Relevant Medications   pantoprazole (PROTONIX) 40 MG tablet     Hyperlipidemia LDL goal <130       Relevant Medications   amLODipine (NORVASC) 5 MG tablet   rosuvastatin (CRESTOR) 20 MG tablet     Multilevel degenerative disc disease       Relevant Medications   traMADol (ULTRAM) 50 MG tablet     Acute pain of right shoulder           Shoulder pain is likely muscular in the upper scapular muscles, gave a list of stretches to do, also recommended muscle relaxer and muscle rubs and encouraged him to get a TENS unit that he can use.  Did discuss going to physical therapy but he says he does not have the time with his current work to be able to do that. Follow up plan: Return in about 3 months (around 06/12/2024), or if symptoms worsen or fail to improve, for Hypertension and degenerative disc disease.  Counseling provided for all of the vaccine components Orders Placed This Encounter  Procedures   CMP14+EGFR   Lipid panel   CBC with Differential/Platelet    Arville Care, MD [facility] 03/12/2024, 3:38 PM

## 2024-03-13 LAB — LIPID PANEL
Chol/HDL Ratio: 2.9 ratio (ref 0.0–5.0)
Cholesterol, Total: 141 mg/dL (ref 100–199)
HDL: 49 mg/dL (ref >39)
LDL Chol Calc (NIH): 75 mg/dL (ref 0–99)
Triglycerides: 87 mg/dL (ref 0–149)
VLDL Cholesterol Cal: 17 mg/dL (ref 5–40)

## 2024-03-17 ENCOUNTER — Encounter: Payer: Self-pay | Admitting: Family Medicine

## 2024-03-18 LAB — COMPREHENSIVE METABOLIC PANEL
ALT: 63 IU/L — ABNORMAL HIGH (ref 0–44)
AST: 49 IU/L — ABNORMAL HIGH (ref 0–40)
Albumin: 4.5 g/dL (ref 3.9–4.9)
Alkaline Phosphatase: 88 IU/L (ref 44–121)
BUN/Creatinine Ratio: 12 (ref 10–24)
BUN: 11 mg/dL (ref 8–27)
Bilirubin Total: 0.6 mg/dL (ref 0.0–1.2)
CO2: 22 mmol/L (ref 20–29)
Calcium: 9.3 mg/dL (ref 8.6–10.2)
Chloride: 102 mmol/L (ref 96–106)
Creatinine, Ser: 0.89 mg/dL (ref 0.76–1.27)
Globulin, Total: 2.9 g/dL (ref 1.5–4.5)
Glucose: 86 mg/dL (ref 70–99)
Potassium: 4.2 mmol/L (ref 3.5–5.2)
Sodium: 142 mmol/L (ref 134–144)
Total Protein: 7.4 g/dL (ref 6.0–8.5)
eGFR: 97 mL/min/{1.73_m2} (ref >59)

## 2024-03-18 LAB — CBC WITH DIFFERENTIAL/PLATELET
Basophils Absolute: 0 10*3/uL (ref 0.0–0.2)
Basos: 0 %
EOS (ABSOLUTE): 0.3 10*3/uL (ref 0.0–0.4)
Eos: 3 %
Hematocrit: 46.5 % (ref 37.5–51.0)
Hemoglobin: 15.7 g/dL (ref 13.0–17.7)
Immature Grans (Abs): 0 10*3/uL (ref 0.0–0.1)
Immature Granulocytes: 0 %
Lymphocytes Absolute: 2.1 10*3/uL (ref 0.7–3.1)
Lymphs: 23 %
MCH: 31.7 pg (ref 26.6–33.0)
MCHC: 33.8 g/dL (ref 31.5–35.7)
MCV: 94 fL (ref 79–97)
Monocytes Absolute: 0.8 10*3/uL (ref 0.1–0.9)
Monocytes: 9 %
Neutrophils Absolute: 5.7 10*3/uL (ref 1.4–7.0)
Neutrophils: 65 %
Platelets: 277 10*3/uL (ref 150–450)
RBC: 4.96 x10E6/uL (ref 4.14–5.80)
RDW: 13.1 % (ref 11.6–15.4)
WBC: 8.9 10*3/uL (ref 3.4–10.8)

## 2024-03-18 LAB — ENHANCED LIVER FIBROSIS (ELF): ELF(TM) Score: 10.06 — ABNORMAL HIGH (ref <9.80)

## 2024-03-18 LAB — HEPATITIS B CORE ANTIBODY, TOTAL: Hep B Core Total Ab: NEGATIVE

## 2024-03-18 LAB — PROTIME-INR
INR: 1 (ref 0.9–1.2)
Prothrombin Time: 11.5 s (ref 9.1–12.0)

## 2024-03-18 LAB — HEPATITIS A ANTIBODY, TOTAL: hep A Total Ab: NEGATIVE

## 2024-03-18 LAB — HEPATITIS B SURFACE ANTIBODY,QUALITATIVE: Hep B Surface Ab, Qual: NONREACTIVE

## 2024-05-25 ENCOUNTER — Ambulatory Visit (INDEPENDENT_AMBULATORY_CARE_PROVIDER_SITE_OTHER): Admitting: Gastroenterology

## 2024-05-25 ENCOUNTER — Other Ambulatory Visit: Payer: Self-pay

## 2024-05-25 ENCOUNTER — Encounter: Payer: Self-pay | Admitting: Gastroenterology

## 2024-05-25 ENCOUNTER — Telehealth: Payer: Self-pay | Admitting: Gastroenterology

## 2024-05-25 VITALS — BP 137/76 | HR 76 | Temp 98.1°F | Ht 76.0 in | Wt 309.4 lb

## 2024-05-25 DIAGNOSIS — R7989 Other specified abnormal findings of blood chemistry: Secondary | ICD-10-CM

## 2024-05-25 DIAGNOSIS — K760 Fatty (change of) liver, not elsewhere classified: Secondary | ICD-10-CM | POA: Diagnosis not present

## 2024-05-25 DIAGNOSIS — Z860100 Personal history of colon polyps, unspecified: Secondary | ICD-10-CM

## 2024-05-25 NOTE — Patient Instructions (Addendum)
 We will plan to update liver labs in six months or so. We will send you reminder information.  Continue to work on weight loss efforts, cholesterol control. Exercise daily. Limit alcohol use.  Return to the office in one year.

## 2024-05-25 NOTE — Telephone Encounter (Signed)
 Patient seen today.   He needs labs in 08/2024: cbc, cmet, pt/inr, alpha 1 antitrypsin phenotype, ceruloplasmin  He also needs abd u/s with elastography in 08/2024. PLEASE NIC/RECALL  Dx: fatty liver, elevated lfts

## 2024-05-25 NOTE — Telephone Encounter (Signed)
 Labs were ordered and will be mailed to the pt to complete in 08/2024  Mandy/Ladonna please nic u/s with elastography

## 2024-05-25 NOTE — Progress Notes (Signed)
 Daniel Crane

## 2024-05-25 NOTE — Progress Notes (Signed)
 GI Office Note    Referring Provider: Dettinger, Lucio Sabin, MD Primary Care Physician:  Dettinger, Lucio Sabin, MD  Primary Gastroenterologist: Rheba Cedar, MD   Chief Complaint   Chief Complaint  Patient presents with   Follow-up    History of Present Illness   Daniel Crane is a 63 y.o. male presenting today for follow up. Last seen 08/2023. H/o chronic intermittent elevated LFTs, fatty liver.   Previous work up of elevated LFTs with negative celiac screen, autoimmune liver serologies negative, HCV RNA negative, Hep B surface Ag negative, no iron overload. U/S 07/2022 with probable steatosis. Labs 10/2023 with NASH Fibrosure test with F2/S0/N0.  Labs 02/2024: Total chol 141 LDL 75 WBC 8.9, Hgb 15.7, Platelets 277 Cre 0.9, Na 142 Alb 4.5, Tbili 0.6, AP 88, AST 49H, ALT 63H INR 1.0 ELF 10.06 mid-risk Hep B core Ab, NR, Hep B surf Ab NR, Hep A total Ab Neg  Today: Has dropped weight. Trying to limit portions. Wife shattered knee, stress.  Tramadol  for pain, tries to limit. Had Hep B vaccine 30 years ago. Recent labs with no immunity to Hep A/B. Patient does not want to have vaccines done. No abdominal pain since on omeprazole, prescribed by PCP. Was having lower abdominal pain before.No prior heartburn. No dysphagia. BMs regular. No blood in stool. No black stools.  Abd u/s 07/2022: increased hepatic parenchymal echogenicity s/o steatosis. No gallstones.   Colonoscopy 11/01/2022: - Preparation of the colon was fair. - Blood in the entire examined colon. - Foreign body in the sigmoid colon. - Three 2 to 4 mm polyps in the sigmoid colon and in the descending colon. - The distal rectum and anal verge are normal on retroflexion view. - No specimens collected. - surveillance colonoscopy 3 years   Colonoscopy 10/31/2022: -6 polyps removed from the transverse, descending and sigmoid segments 4 to 8 mm cold snared. - Diverticulosis in the sigmoid colon and in the descending  colon. -Single 1.2 cm likely polyp in his mid sigmoid attempted hot snare but was cold snare due to equipment malfunction. Clip placed. -Normal hemorrhoids -tubular adenomas -next surveillance in 3 years     Wt Readings from Last 3 Encounters:  05/25/24 (!) 309 lb 6.4 oz (140.3 kg)  03/12/24 (!) 320 lb (145.2 kg)  12/12/23 (!) 325 lb (147.4 kg)      Medications   Current Outpatient Medications  Medication Sig Dispense Refill   amLODipine  (NORVASC ) 5 MG tablet Take 1 tablet (5 mg total) by mouth daily. 90 tablet 3   aspirin  EC 81 MG tablet Take 1 tablet (81 mg total) by mouth daily with breakfast. 30 tablet 11   Cyanocobalamin (VITAMIN B12 PO) Take 1 tablet by mouth daily.     cyclobenzaprine (FLEXERIL) 10 MG tablet Take 10 mg by mouth at bedtime.     MAGNESIUM PO Take 1 tablet by mouth See admin instructions. Take 1 tablet in the morning and 2 tablets every evening     Milk Thistle 1000 MG CAPS Take 1 tablet by mouth every evening.     Multiple Vitamins-Minerals (MULTIVITAMIN WITH MINERALS) tablet Take 1 tablet by mouth daily.     Omega-3 Fatty Acids (FISH OIL PO) Take 1 capsule by mouth daily.     pantoprazole  (PROTONIX ) 40 MG tablet Take 1 tablet (40 mg total) by mouth daily. 90 tablet 3   rosuvastatin  (CRESTOR ) 20 MG tablet Take 1 tablet (20 mg total) by mouth daily. 90  tablet 3   traMADol  (ULTRAM ) 50 MG tablet Take 1 tablet (50 mg total) by mouth every 12 (twelve) hours as needed. 60 tablet 2   traZODone  (DESYREL ) 50 MG tablet Take 1 tablet (50 mg total) by mouth at bedtime. 90 tablet 3   Turmeric 400 MG CAPS Take 400 mg by mouth 2 (two) times daily.      vitamin C (ASCORBIC ACID) 500 MG tablet Take 500 mg by mouth daily.     zinc gluconate 50 MG tablet Take 50 mg by mouth daily.     No current facility-administered medications for this visit.    Allergies   Allergies as of 05/25/2024   (No Known Allergies)       Review of Systems   General: Negative for anorexia,  unintentional weight loss, fever, chills, fatigue, weakness. ENT: Negative for hoarseness, difficulty swallowing , nasal congestion. CV: Negative for chest pain, angina, palpitations, dyspnea on exertion, peripheral edema.  Respiratory: Negative for dyspnea at rest, dyspnea on exertion, cough, sputum, wheezing.  GI: See history of present illness. GU:  Negative for dysuria, hematuria, urinary incontinence, urinary frequency, nocturnal urination.  Endo: Negative for unusual weight change.     Physical Exam   BP 137/76 (BP Location: Right Arm, Patient Position: Sitting, Cuff Size: Large)   Pulse 76   Temp 98.1 F (36.7 C) (Oral)   Ht 6\' 4"  (1.93 m)   Wt (!) 309 lb 6.4 oz (140.3 kg)   SpO2 95%   BMI 37.66 kg/m    General: Well-nourished, well-developed in no acute distress.  Eyes: No icterus. Mouth: Oropharyngeal mucosa moist and pink   Abdomen: Bowel sounds are normal, nontender, nondistended, no hepatosplenomegaly or masses,  no abdominal bruits, no rebound or guarding. Small umb hernia, nontender Rectal: not performed  Extremities: trace lower extremity edema. No clubbing or deformities. Neuro: Alert and oriented x 4   Skin: Warm and dry, no jaundice.   Psych: Alert and cooperative, normal mood and affect.  Labs   Lab Results  Component Value Date   NA 142 03/12/2024   CL 102 03/12/2024   K 4.2 03/12/2024   CO2 22 03/12/2024   BUN 11 03/12/2024   CREATININE 0.89 03/12/2024   EGFR 97 03/12/2024   CALCIUM  9.3 03/12/2024   PHOS 3.5 11/01/2022   ALBUMIN 4.5 03/12/2024   GLUCOSE 86 03/12/2024   Lab Results  Component Value Date   ALT 63 (H) 03/12/2024   AST 49 (H) 03/12/2024   ALKPHOS 88 03/12/2024   BILITOT 0.6 03/12/2024   Lab Results  Component Value Date   WBC 8.9 03/12/2024   HGB 15.7 03/12/2024   HCT 46.5 03/12/2024   MCV 94 03/12/2024   PLT 277 03/12/2024   Lab Results  Component Value Date   IRON 68 12/19/2022   TIBC 424 12/19/2022    Imaging  Studies   No results found.  Assessment   Fatty liver/elevated LFTs:  -fatty liver on u/s. Risk factors including obesity, hyperlipidemia, alcohol use -AST/ALT mildly elevated chronically with extensive serologies previously negative. -NAFLD score F0-F2 -FIB 4 1.38, further investigation needed -consider alpha antitrypsin phenotype with next labs to be complete, consider ceruloplasmin. Will update labs in 08/2024. -abdominal u/s with elastography in 08/2024 -Instructions for fatty liver: Recommend 1-2# weight loss per week until ideal body weight through exercise & diet. Low fat/cholesterol diet.   Avoid sweets, sodas, fruit juices, sweetened beverages like tea, etc. Gradually increase exercise from 15 min daily  up to 1 hr per day 5 days/week. Limit alcohol use. -return ov in one year.   H/o colon polyps: -due 10/2025   Trudie Fuse. Harles Lied, MHS, PA-C [facility]

## 2024-06-01 ENCOUNTER — Emergency Department (HOSPITAL_COMMUNITY)

## 2024-06-01 ENCOUNTER — Emergency Department (HOSPITAL_COMMUNITY)
Admission: EM | Admit: 2024-06-01 | Discharge: 2024-06-01 | Disposition: A | Discharge status: Home / Self Care | Attending: Emergency Medicine | Admitting: Emergency Medicine

## 2024-06-01 ENCOUNTER — Other Ambulatory Visit: Payer: Self-pay

## 2024-06-01 ENCOUNTER — Encounter (HOSPITAL_COMMUNITY): Payer: Self-pay

## 2024-06-01 DIAGNOSIS — Z7982 Long term (current) use of aspirin: Secondary | ICD-10-CM | POA: Insufficient documentation

## 2024-06-01 DIAGNOSIS — R42 Dizziness and giddiness: Secondary | ICD-10-CM | POA: Diagnosis present

## 2024-06-01 DIAGNOSIS — H81399 Other peripheral vertigo, unspecified ear: Secondary | ICD-10-CM | POA: Diagnosis not present

## 2024-06-01 LAB — CBC WITH DIFFERENTIAL/PLATELET
Abs Immature Granulocytes: 0.02 10*3/uL (ref 0.00–0.07)
Basophils Absolute: 0 10*3/uL (ref 0.0–0.1)
Basophils Relative: 0 %
Eosinophils Absolute: 0.4 10*3/uL (ref 0.0–0.5)
Eosinophils Relative: 4 %
HCT: 47.5 % (ref 39.0–52.0)
Hemoglobin: 15.7 g/dL (ref 13.0–17.0)
Immature Granulocytes: 0 %
Lymphocytes Relative: 24 %
Lymphs Abs: 2 10*3/uL (ref 0.7–4.0)
MCH: 32.9 pg (ref 26.0–34.0)
MCHC: 33.1 g/dL (ref 30.0–36.0)
MCV: 99.6 fL (ref 80.0–100.0)
Monocytes Absolute: 0.8 10*3/uL (ref 0.1–1.0)
Monocytes Relative: 9 %
Neutro Abs: 5.3 10*3/uL (ref 1.7–7.7)
Neutrophils Relative %: 63 %
Platelets: 263 10*3/uL (ref 150–400)
RBC: 4.77 MIL/uL (ref 4.22–5.81)
RDW: 13.1 % (ref 11.5–15.5)
WBC: 8.4 10*3/uL (ref 4.0–10.5)
nRBC: 0 % (ref 0.0–0.2)

## 2024-06-01 LAB — COMPREHENSIVE METABOLIC PANEL WITH GFR
ALT: 47 U/L — ABNORMAL HIGH (ref 0–44)
AST: 40 U/L (ref 15–41)
Albumin: 4 g/dL (ref 3.5–5.0)
Alkaline Phosphatase: 68 U/L (ref 38–126)
Anion gap: 9 (ref 5–15)
BUN: 15 mg/dL (ref 8–23)
CO2: 21 mmol/L — ABNORMAL LOW (ref 22–32)
Calcium: 9 mg/dL (ref 8.9–10.3)
Chloride: 102 mmol/L (ref 98–111)
Creatinine, Ser: 0.88 mg/dL (ref 0.61–1.24)
GFR, Estimated: >60 mL/min (ref >60)
Glucose, Bld: 104 mg/dL — ABNORMAL HIGH (ref 70–99)
Potassium: 3.5 mmol/L (ref 3.5–5.1)
Sodium: 132 mmol/L — ABNORMAL LOW (ref 135–145)
Total Bilirubin: 0.8 mg/dL (ref 0.0–1.2)
Total Protein: 7.7 g/dL (ref 6.5–8.1)

## 2024-06-01 LAB — BRAIN NATRIURETIC PEPTIDE: B Natriuretic Peptide: 29 pg/mL (ref 0.0–100.0)

## 2024-06-01 LAB — TROPONIN I (HIGH SENSITIVITY)
Troponin I (High Sensitivity): 9 ng/L (ref <18)
Troponin I (High Sensitivity): 8 ng/L (ref <18)

## 2024-06-01 MED ORDER — MECLIZINE HCL 25 MG PO TABS: 25.0000 mg | ORAL_TABLET | Freq: Three times a day (TID) | ORAL | 0 refills | Status: DC | PRN

## 2024-06-01 MED ORDER — MECLIZINE HCL 12.5 MG PO TABS
25.0000 mg | ORAL_TABLET | Freq: Once | ORAL | Status: AC
Start: 2024-06-01 — End: 2024-06-01
  Administered 2024-06-01: 25 mg via ORAL
  Filled 2024-06-01: qty 2

## 2024-06-01 NOTE — ED Triage Notes (Signed)
 Pt arrived via POV c/o recurrent dizziness for the past few weeks. Pt reports once having vertigo many years ago. Pt reports dizziness intensifies when he changes elevation.

## 2024-06-01 NOTE — Discharge Instructions (Addendum)
 Follow up with your Physician for recheck.  Return if any problems.

## 2024-06-01 NOTE — ED Notes (Signed)
 Patient transported to CT

## 2024-06-01 NOTE — ED Notes (Signed)
 Possible new onset AFIB, during triage pts hr went down to 38 and back up to 86 and back down to 40, and back up to 87.

## 2024-06-01 NOTE — ED Notes (Addendum)
 Patient transported to X-ray

## 2024-06-01 NOTE — ED Provider Notes (Signed)
 [name] [facility] DEPARTMENT AT [facility] Provider Note   CSN: [id number] Arrival date & time: 06/01/24  1311     History  Chief Complaint  Patient presents with   Dizziness    Daniel Crane is a 63 y.o. male.  Patient is a 63 year old male here with a cc of dizziness for the past 2-3 weeks. PMH is relevant for hypertension, hyperlipidemia, angina, osteoarthritis, and vertigo. Patient states the dizziness is periodical and states that it is positional and worsens when he moves his eyes "up and to the left." When he stops moving and sits still the dizziness resolves. He states that it is worse the last 2-3 days and he has had to catch himself to make sure he doesn't fall. Patient does say he has unintentionally lost some weight (16 pounds) since December stating that his appetite has changed and he is stopping when he is full. Denies any angina chest pain, any recent colds, congestion, or sinus issues.   The history is provided by the patient and the spouse. No language interpreter was used.  Dizziness Quality:  Vertigo Severity:  Severe Onset quality:  Gradual Timing:  Constant Progression:  Worsening Worsened by:  Movement and standing up Ineffective treatments:  Change in position and closing eyes Associated symptoms: no headaches and no hearing loss   Risk factors: hx of vertigo        Home Medications Prior to Admission medications   Medication Sig Start Date End Date Taking? Authorizing Provider  meclizine (ANTIVERT) 25 MG tablet Take 1 tablet (25 mg total) by mouth 3 (three) times daily as needed for dizziness. 06/01/24  Yes Madeeha Costantino K, PA-C  amLODipine  (NORVASC ) 5 MG tablet Take 1 tablet (5 mg total) by mouth daily. 03/12/24   Dettinger, Lucio Sabin, MD  aspirin  EC 81 MG tablet Take 1 tablet (81 mg total) by mouth daily with breakfast. 11/02/22   Emokpae, Courage, MD  Cyanocobalamin (VITAMIN B12 PO) Take 1 tablet by mouth daily.    [provider]   cyclobenzaprine (FLEXERIL) 10 MG tablet Take 10 mg by mouth at bedtime.    [provider]  MAGNESIUM PO Take 1 tablet by mouth See admin instructions. Take 1 tablet in the morning and 2 tablets every evening    [provider]  Milk Thistle 1000 MG CAPS Take 1 tablet by mouth every evening.    [provider]  Multiple Vitamins-Minerals (MULTIVITAMIN WITH MINERALS) tablet Take 1 tablet by mouth daily.    [provider]  Omega-3 Fatty Acids (FISH OIL PO) Take 1 capsule by mouth daily.    [provider]  pantoprazole  (PROTONIX ) 40 MG tablet Take 1 tablet (40 mg total) by mouth daily. 03/12/24   Dettinger, Lucio Sabin, MD  rosuvastatin  (CRESTOR ) 20 MG tablet Take 1 tablet (20 mg total) by mouth daily. 03/12/24   Dettinger, Lucio Sabin, MD  traMADol  (ULTRAM ) 50 MG tablet Take 1 tablet (50 mg total) by mouth every 12 (twelve) hours as needed. 03/12/24   Dettinger, Lucio Sabin, MD  traZODone  (DESYREL ) 50 MG tablet Take 1 tablet (50 mg total) by mouth at bedtime. 03/12/24   Dettinger, Lucio Sabin, MD  Turmeric 400 MG CAPS Take 400 mg by mouth 2 (two) times daily.     [provider]  vitamin C (ASCORBIC ACID) 500 MG tablet Take 500 mg by mouth daily.    [provider]  zinc gluconate 50 MG tablet Take  50 mg by mouth daily.    [provider]      Allergies    Patient has no known allergies.    Review of Systems   Review of Systems  HENT:  Negative for hearing loss.   Neurological:  Positive for dizziness. Negative for headaches.  All other systems reviewed and are negative.   Physical Exam Updated Vital Signs BP 137/74   Pulse 66   Temp 97.7 F (36.5 C) (Temporal)   Resp 16   Ht 6\' 4"  (1.93 m)   Wt (!) 140.3 kg   SpO2 94%   BMI 37.66 kg/m  Physical Exam Vitals and nursing note reviewed.  Constitutional:      Appearance: He is well-developed.  HENT:     Head: Normocephalic.  Cardiovascular:     Rate and Rhythm: Normal  rate.  Pulmonary:     Effort: Pulmonary effort is normal.  Abdominal:     General: There is no distension.  Musculoskeletal:        General: Normal range of motion.     Cervical back: Normal range of motion.  Skin:    General: Skin is warm.  Neurological:     General: No focal deficit present.     Mental Status: He is alert and oriented to person, place, and time.     Comments: Dizziness with looking left   Psychiatric:        Mood and Affect: Mood normal.     ED Results / Procedures / Treatments   Labs (all labs ordered are listed, but only abnormal results are displayed) Labs Reviewed  COMPREHENSIVE METABOLIC PANEL WITH GFR - Abnormal; Notable for the following components:      Result Value   Sodium 132 (*)    CO2 21 (*)    Glucose, Bld 104 (*)    ALT 47 (*)    All other components within normal limits  CBC WITH DIFFERENTIAL/PLATELET  BRAIN NATRIURETIC PEPTIDE  TROPONIN I (HIGH SENSITIVITY)  TROPONIN I (HIGH SENSITIVITY)    EKG EKG Interpretation Date/Time:  Tuesday June 01 2024 13:35:56 EDT Ventricular Rate:  80 PR Interval:  190 QRS Duration:  126 QT Interval:  421 QTC Calculation: 486 R Axis:   36  Text Interpretation: Sinus rhythm Right bundle branch block Confirmed by Iva Mariner ([id number]) on 06/01/2024 1:52:57 PM  Radiology CT Head Wo Contrast Result Date: 06/01/2024 CLINICAL DATA:  Vertigo, dizziness, abnormal heart rate. EXAM: CT HEAD WITHOUT CONTRAST TECHNIQUE: Contiguous axial images were obtained from the base of the skull through the vertex without intravenous contrast. RADIATION DOSE REDUCTION: This exam was performed according to the departmental dose-optimization program which includes automated exposure control, adjustment of the mA and/or kV according to patient size and/or use of iterative reconstruction technique. COMPARISON:  None Available. FINDINGS: Brain: No acute intracranial hemorrhage. No CT evidence of acute infarct. No edema, mass effect, or  midline shift. The basilar cisterns are patent. Ventricles: The ventricles are normal. Vascular: Atherosclerotic calcifications of the carotid siphons. No hyperdense vessel. Skull: No acute or aggressive finding. Orbits: Orbits are symmetric. Sinuses: Mild mucosal thickening in the right sphenoid sinus. Other: Mastoid air cells are clear. IMPRESSION: No CT evidence of acute intracranial abnormality. Electronically Signed   By: Denny Flack M.D.   On: 06/01/2024 16:33   DG Chest 2 View Result Date: 06/01/2024 CLINICAL DATA:  Dizziness and shortness of breath. EXAM: CHEST - 2 VIEW COMPARISON:  07/26/2011 FINDINGS: Lungs are clear.  Heart and mediastinum are within normal limits. Trachea is midline. Negative for a pneumothorax. IMPRESSION: No active cardiopulmonary disease. Electronically Signed   By: Elene Griffes M.D.   On: 06/01/2024 15:45    Procedures Procedures    Medications Ordered in ED Medications  meclizine (ANTIVERT) tablet 25 mg (25 mg Oral Given 06/01/24 1455)    ED Course/ Medical Decision Making/ A&P                                 Medical Decision Making Patient complains of dizziness for the past 2 weeks.  Patient reports symptoms have been worse over the last 2 to 3 days.  Patient has had vertigo in the past.  Patient denies any weakness in any extremities.  He has not had any chest pain.  He has not had any numbness or tingling.  Amount and/or Complexity of Data Reviewed Independent Historian: spouse    Details: Patient is here with his wife who is supportive External Data Reviewed: notes.    Details: Primary care notes reviewed Labs: ordered. Decision-making details documented in ED Course.    Details: Labs ordered reviewed and interpreted.  Sodium is 132 troponin is negative. Radiology: ordered and independent interpretation performed. Decision-making details documented in ED Course.    Details: Chest x-ray shows no acute disease CT scan no acute findings ECG/medicine  tests: ordered and independent interpretation performed. Decision-making details documented in ED Course.    Details: EKG sinus rhythm no acute abnormality  Risk Prescription drug management. Risk Details: Dr. Garrett Kallman in to see and evaluate pt.   Pt given meclizine.  Pt reports feeling better.  Pt able to ambulate without difficulty Pt advised to see his primary care physicain for recheck             Final Clinical Impression(s) / ED Diagnoses Final diagnoses:  Dizziness  Peripheral vertigo, unspecified laterality    Rx / DC Orders ED Discharge Orders          Ordered    meclizine (ANTIVERT) 25 MG tablet  3 times daily PRN        06/01/24 1656          An After Visit Summary was printed and given to the patient.     Sandi Crosby, PA-C 06/01/24 1705    Karlyn Overman, MD 06/02/24 [phone]

## 2024-06-03 ENCOUNTER — Emergency Department (HOSPITAL_COMMUNITY)
Admission: EM | Admit: 2024-06-03 | Discharge: 2024-06-03 | Disposition: A | Discharge status: Home / Self Care | Attending: Emergency Medicine | Admitting: Emergency Medicine

## 2024-06-03 ENCOUNTER — Emergency Department (HOSPITAL_COMMUNITY)

## 2024-06-03 ENCOUNTER — Other Ambulatory Visit: Payer: Self-pay

## 2024-06-03 DIAGNOSIS — R0789 Other chest pain: Secondary | ICD-10-CM | POA: Diagnosis present

## 2024-06-03 DIAGNOSIS — Z7982 Long term (current) use of aspirin: Secondary | ICD-10-CM | POA: Diagnosis not present

## 2024-06-03 DIAGNOSIS — Z79899 Other long term (current) drug therapy: Secondary | ICD-10-CM | POA: Insufficient documentation

## 2024-06-03 DIAGNOSIS — I10 Essential (primary) hypertension: Secondary | ICD-10-CM | POA: Diagnosis not present

## 2024-06-03 LAB — CBC
HCT: 44.9 % (ref 39.0–52.0)
Hemoglobin: 15.5 g/dL (ref 13.0–17.0)
MCH: 32.4 pg (ref 26.0–34.0)
MCHC: 34.5 g/dL (ref 30.0–36.0)
MCV: 93.7 fL (ref 80.0–100.0)
Platelets: 237 10*3/uL (ref 150–400)
RBC: 4.79 MIL/uL (ref 4.22–5.81)
RDW: 13.1 % (ref 11.5–15.5)
WBC: 5.8 10*3/uL (ref 4.0–10.5)
nRBC: 0 % (ref 0.0–0.2)

## 2024-06-03 LAB — TROPONIN I (HIGH SENSITIVITY)
Troponin I (High Sensitivity): 6 ng/L (ref <18)
Troponin I (High Sensitivity): 5 ng/L (ref <18)

## 2024-06-03 LAB — BASIC METABOLIC PANEL WITH GFR
Anion gap: 10 (ref 5–15)
BUN: 15 mg/dL (ref 8–23)
CO2: 23 mmol/L (ref 22–32)
Calcium: 8.7 mg/dL — ABNORMAL LOW (ref 8.9–10.3)
Chloride: 103 mmol/L (ref 98–111)
Creatinine, Ser: 0.75 mg/dL (ref 0.61–1.24)
GFR, Estimated: >60 mL/min (ref >60)
Glucose, Bld: 108 mg/dL — ABNORMAL HIGH (ref 70–99)
Potassium: 3.8 mmol/L (ref 3.5–5.1)
Sodium: 136 mmol/L (ref 135–145)

## 2024-06-03 MED ORDER — IOHEXOL 350 MG/ML SOLN
100.0000 mL | Freq: Once | INTRAVENOUS | Status: AC | PRN
Start: 2024-06-03 — End: 2024-06-03
  Administered 2024-06-03: 100 mL via INTRAVENOUS

## 2024-06-03 MED ORDER — AMLODIPINE BESYLATE 5 MG PO TABS: 10.0000 mg | ORAL_TABLET | Freq: Every day | ORAL | 3 refills | Status: AC

## 2024-06-03 MED ORDER — ASPIRIN 81 MG PO CHEW
244.0000 mg | CHEWABLE_TABLET | Freq: Once | ORAL | Status: AC
  Administered 2024-06-03: 244 mg via ORAL
  Filled 2024-06-03: qty 4

## 2024-06-03 MED ORDER — NITROGLYCERIN 0.4 MG SL SUBL
0.4000 mg | SUBLINGUAL_TABLET | SUBLINGUAL | Status: DC | PRN
  Filled 2024-06-03: qty 1

## 2024-06-03 MED ORDER — MORPHINE SULFATE (PF) 4 MG/ML IV SOLN
4.0000 mg | Freq: Once | INTRAVENOUS | Status: AC
  Administered 2024-06-03: 4 mg via INTRAVENOUS
  Filled 2024-06-03: qty 1

## 2024-06-03 NOTE — ED Notes (Addendum)
 Pt has an appointment with Dr. Amanda Jungling next Thursday at 2:20.  Pt made aware.

## 2024-06-03 NOTE — Discharge Instructions (Addendum)
 Thank you for coming to [facility] Emergency Department. You were seen for chest pain. We did an exam, labs, and imaging, and these showed no acute findings. We discussed with Dr. Amanda Jungling with cardiology who recommended increasing your amlodipine  to 10 mg once per day. Please follow up with Dr. Amanda Jungling in clinic on 06/10/24 at 2:20 pm.  Do not hesitate to return to the ED or call 911 if you experience: -Worsening symptoms -Lightheadedness, passing out -Fevers/chills -Anything else that concerns you

## 2024-06-03 NOTE — ED Provider Notes (Signed)
 [place] EMERGENCY DEPARTMENT AT [facility] Provider Note   CSN: [id number] Arrival date & time: 06/03/24  4098     History  Chief Complaint  Patient presents with   Chest Pain    Daniel Crane is a 63 y.o. male with PMH as listed below who presents with c/o constant left-sided 6/10 chest pain that started this morning.Pt took 81mg  ASA this morning. Started at 0600 AM and woke him from sleep. Located in left chest, now radiating down left arm and has a headache now. Doesn't have nitroglycerin  at home. Has been under a lot of stress recently as wife has been injured and had surgery for broken patella. Denies diaphoresis, abdominal pain, nausea/vomiting. Per chart review presented to ED on 06/01/24 w/ sxs c/w peripheral vertigo. Still states that if he moves too quickly gets dizzy and has been taking meclizine . States he has had chest pain like this before when he was diagnosed w/ heart blockage, medically managed. Not worse with movement. No SOB.   Past Medical History:  Diagnosis Date   Anginal pain (HCC)    history of 11 years ago. No meds at present.   Arthritis    Collagen vascular disease (HCC)    Heart disease    catheterization in remote past, medically managed   Hypercholesteremia    Hypertension    Sleep apnea        Home Medications Prior to Admission medications   Medication Sig Start Date End Date Taking? Authorizing Provider  amLODipine  (NORVASC ) 5 MG tablet Take 1 tablet (5 mg total) by mouth daily. 03/12/24   Dettinger, Lucio Sabin, MD  aspirin  EC 81 MG tablet Take 1 tablet (81 mg total) by mouth daily with breakfast. 11/02/22   Emokpae, Courage, MD  Cyanocobalamin (VITAMIN B12 PO) Take 1 tablet by mouth daily.    [provider]  cyclobenzaprine (FLEXERIL) 10 MG tablet Take 10 mg by mouth at bedtime.    [provider]  MAGNESIUM PO Take 1 tablet by mouth See admin instructions. Take 1 tablet in the morning and 2 tablets every evening     [provider]  meclizine  (ANTIVERT ) 25 MG tablet Take 1 tablet (25 mg total) by mouth 3 (three) times daily as needed for dizziness. 06/01/24   Sofia, Leslie K, PA-C  Milk Thistle 1000 MG CAPS Take 1 tablet by mouth every evening.    [provider]  Multiple Vitamins-Minerals (MULTIVITAMIN WITH MINERALS) tablet Take 1 tablet by mouth daily.    [provider]  Omega-3 Fatty Acids (FISH OIL PO) Take 1 capsule by mouth daily.    [provider]  pantoprazole  (PROTONIX ) 40 MG tablet Take 1 tablet (40 mg total) by mouth daily. 03/12/24   Dettinger, Lucio Sabin, MD  rosuvastatin  (CRESTOR ) 20 MG tablet Take 1 tablet (20 mg total) by mouth daily. 03/12/24   Dettinger, Lucio Sabin, MD  traMADol  (ULTRAM ) 50 MG tablet Take 1 tablet (50 mg total) by mouth every 12 (twelve) hours as needed. 03/12/24   Dettinger, Lucio Sabin, MD  traZODone  (DESYREL ) 50 MG tablet Take 1 tablet (50 mg total) by mouth at bedtime. 03/12/24   Dettinger, Lucio Sabin, MD  Turmeric 400 MG CAPS Take 400 mg by mouth 2 (two) times daily.     [provider]  vitamin C (ASCORBIC ACID) 500 MG tablet Take 500 mg by mouth daily.    [provider]  zinc gluconate 50 MG tablet Take 50 mg  by mouth daily.    [provider]      Allergies    Patient has no known allergies.    Review of Systems   Review of Systems A 10 point review of systems was performed and is negative unless otherwise reported in HPI.  Physical Exam Updated Vital Signs BP (!) 144/85   Pulse 68   Temp 97.7 F (36.5 C) (Oral)   Resp 16   Ht 6\' 4"  (1.93 m)   Wt (!) 140.2 kg   SpO2 96%   BMI 37.61 kg/m  Physical Exam General: Uncomfortable appearing obese male, lying in bed.  HEENT: PERRLA, Sclera anicteric, MMM, trachea midline.  Cardiology: RRR, no murmurs/rubs/gallops. BL radial and DP pulses equal bilaterally.  Resp: Normal respiratory rate and effort. CTAB, no wheezes, rhonchi, crackles.  Abd: Soft,  non-tender, non-distended. No rebound tenderness or guarding.  GU: Deferred. MSK: No peripheral edema or signs of trauma. Extremities without deformity or TTP. No cyanosis or clubbing. Skin: warm, dry.  Neuro: A&Ox4, CNs II-XII grossly intact. MAEs. Sensation grossly intact.  Psych: Normal mood and affect.   ED Results / Procedures / Treatments   Labs (all labs ordered are listed, but only abnormal results are displayed) Labs Reviewed  BASIC METABOLIC PANEL WITH GFR - Abnormal; Notable for the following components:      Result Value   Glucose, Bld 108 (*)    Calcium  8.7 (*)    All other components within normal limits  CBC  TROPONIN I (HIGH SENSITIVITY)    EKG EKG Interpretation Date/Time:  Thursday June 03 2024 08:05:07 EDT Ventricular Rate:  73 PR Interval:  212 QRS Duration:  126 QT Interval:  408 QTC Calculation: 450 R Axis:   56  Text Interpretation: Sinus rhythm Borderline prolonged PR interval Right bundle branch block Confirmed by Annita Kindle [phone]) on 06/03/2024 8:13:05 AM  Radiology DG Chest 2 View Result Date: 06/03/2024 CLINICAL DATA:  Chest pain EXAM: CHEST - 2 VIEW COMPARISON:  June 01, 2024 FINDINGS: The heart size and mediastinal contours are within normal limits. Both lungs are clear. The visualized skeletal structures are unremarkable. IMPRESSION: No active cardiopulmonary disease. Electronically Signed   By: Fredrich Jefferson M.D.   On: 06/03/2024 08:37   CT Head Wo Contrast Result Date: 06/01/2024 CLINICAL DATA:  Vertigo, dizziness, abnormal heart rate. EXAM: CT HEAD WITHOUT CONTRAST TECHNIQUE: Contiguous axial images were obtained from the base of the skull through the vertex without intravenous contrast. RADIATION DOSE REDUCTION: This exam was performed according to the departmental dose-optimization program which includes automated exposure control, adjustment of the mA and/or kV according to patient size and/or use of iterative reconstruction technique.  COMPARISON:  None Available. FINDINGS: Brain: No acute intracranial hemorrhage. No CT evidence of acute infarct. No edema, mass effect, or midline shift. The basilar cisterns are patent. Ventricles: The ventricles are normal. Vascular: Atherosclerotic calcifications of the carotid siphons. No hyperdense vessel. Skull: No acute or aggressive finding. Orbits: Orbits are symmetric. Sinuses: Mild mucosal thickening in the right sphenoid sinus. Other: Mastoid air cells are clear. IMPRESSION: No CT evidence of acute intracranial abnormality. Electronically Signed   By: Denny Flack M.D.   On: 06/01/2024 16:33   DG Chest 2 View Result Date: 06/01/2024 CLINICAL DATA:  Dizziness and shortness of breath. EXAM: CHEST - 2 VIEW COMPARISON:  07/26/2011 FINDINGS: Lungs are clear. Heart and mediastinum are within normal limits. Trachea is midline. Negative for a pneumothorax. IMPRESSION: No active cardiopulmonary disease.  Electronically Signed   By: Elene Griffes M.D.   On: 06/01/2024 15:45    Procedures Procedures    Medications Ordered in ED Medications  aspirin  chewable tablet 244 mg (244 mg Oral Given 06/03/24 1019)  morphine  (PF) 4 MG/ML injection 4 mg (4 mg Intravenous Given 06/03/24 1020)  iohexol  (OMNIPAQUE ) 350 MG/ML injection 100 mL (100 mLs Intravenous Contrast Given 06/03/24 1029)    ED Course/ Medical Decision Making/ A&P                          Medical Decision Making Amount and/or Complexity of Data Reviewed Labs: ordered. Decision-making details documented in ED Course. Radiology: ordered. Decision-making details documented in ED Course.  Risk OTC drugs. Prescription drug management.    This patient presents to the ED for concern of CP, this involves an extensive number of treatment options, and is a complaint that carries with it a high risk of complications and morbidity.  I considered the following differential and admission for this acute, potentially life threatening condition.   MDM:     DDX for chest pain includes but is not limited to:  EKG/labs reassuring against ACS w/ neg trop x2. Possible anginal sxs. He has no PNA/pulm edema/PTX/pleural effusion on CXR. No dissection/aneurysm on CT scan. On reevaluation patient states his CP is resolved. Will consult to cards for f/u.   Clinical Course as of 06/07/24 2049  Thu Jun 03, 2024  0942 Troponin I (High Sensitivity): 6 neg [HN]  [phone] DG Chest 2 View No active cardiopulmonary disease. [HN]  1106 Troponin I (High Sensitivity): 5 Neg x2 [HN]  1106 CT Angio Chest/Abd/Pel for Dissection W and/or Wo Contrast No evidence of thoracic or abdominal aortic dissection or aneurysm.  Sigmoid diverticulosis without inflammation.  Multilevel degenerative disc disease seen in the lumbar spine.  Coronary artery calcifications are noted suggesting coronary disease.  Aortic Atherosclerosis (ICD10-I70.0).   [HN]  1130 D/w cardiology Dr. Amanda Jungling who recommends increasing amlodipine  to 10 mg for anti-anginal and will get him prompt follow-up. [HN]  1243 F/u scheduled with Dr. Amanda Jungling on 6/12 at 2:20 pm [HN]    Clinical Course User Index [HN] Merdis Stalling, MD    Labs: I Ordered, and personally interpreted labs.  The pertinent results include:  those listed above  Imaging Studies ordered: I ordered imaging studies including CXR, CT dissection I independently visualized and interpreted imaging. I agree with the radiologist interpretation  Additional history obtained from chart review.    Cardiac Monitoring: The patient was maintained on a cardiac monitor.  I personally viewed and interpreted the cardiac monitored which showed an underlying rhythm of: NSR  Reevaluation: After the interventions noted above, I reevaluated the patient and found that they have :resolved  Social Determinants of Health: Lives independently  Disposition:  DC w/ discharge instructions/return precautions. All questions answered to patient's  satisfaction.    Co morbidities that complicate the patient evaluation  Past Medical History:  Diagnosis Date   Anginal pain (HCC)    history of 11 years ago. No meds at present.   Arthritis    Collagen vascular disease (HCC)    Heart disease    catheterization in remote past, medically managed   Hypercholesteremia    Hypertension    Sleep apnea      Medicines No orders of the defined types were placed in this encounter.   I have reviewed the patients home medicines and have made  adjustments as needed  Problem List / ED Course: Atypical chest pain               This note was created using dictation software, which may contain spelling or grammatical errors.    Merdis Stalling, MD 06/07/24 [phone]

## 2024-06-03 NOTE — ED Triage Notes (Signed)
 Pt c/o chest  pain that started this morning.Pt took 81mg  ASA this morning. No n/v noted. Pt has cardiac hx.

## 2024-06-10 ENCOUNTER — Ambulatory Visit: Attending: Cardiology | Admitting: Cardiology

## 2024-06-10 ENCOUNTER — Encounter: Payer: Self-pay | Admitting: Cardiology

## 2024-06-10 VITALS — BP 155/85 | HR 84 | Ht 76.0 in | Wt 309.0 lb

## 2024-06-10 DIAGNOSIS — I2510 Atherosclerotic heart disease of native coronary artery without angina pectoris: Secondary | ICD-10-CM | POA: Diagnosis not present

## 2024-06-10 DIAGNOSIS — Z79899 Other long term (current) drug therapy: Secondary | ICD-10-CM

## 2024-06-10 DIAGNOSIS — I10 Essential (primary) hypertension: Secondary | ICD-10-CM

## 2024-06-10 DIAGNOSIS — E782 Mixed hyperlipidemia: Secondary | ICD-10-CM

## 2024-06-10 MED ORDER — LOSARTAN POTASSIUM 25 MG PO TABS: 25.0000 mg | ORAL_TABLET | Freq: Every day | ORAL | 3 refills | Status: AC

## 2024-06-10 NOTE — Progress Notes (Signed)
 Clinical Summary Mr. Lariccia is a 63 y.o.male seen today for follow up of the following medical problems.    1. CAD - cath 2005 as reported below. 50% LAD/ostial D1. Nuclear stress at that time did not show ischemia, this was managed medically - significant SOB reported in 10/2017 -10/2017 echo: LVEF 60-65%, mild MR  - 10/2017 nuclear stress: duke score of 6, apical inferior, basal inferoseptal, inferolateral reversible defects. Overall low to intermediate risk study.        - ER visit 06/03/24 with chest pains - Trops neg x 2. EKG SR, RBBB, no acute ischemic changes - CXR no acute process - CTA C/A/P: no acute aorta findings - norvasc  increased to 10mg , discharged from ER -woke up from sleep. Stabbing left sided, up into left shoulder. 5-6/10 in severity. No SOB, no diaphoretic. Not positional. Pain lasted about 3.5 to 4 hours.  - no recurrence  - does heavy work at his recurrence.       2. Hyperlipidemia -has not restarted crestor  as discussed at last visit. Denies any significant side effects when taking.     02/2024 TC 141 TG 87 HDL 49 LDL 75   3. OSA  - abnormal sleep study 10/2017, followed by Dr Zoila Hines.  - could not afford CPAP machine - not ready to f/u with pulmonary   4. HTN - compliant with meds       SH: works as Curator  Past Medical History:  Diagnosis Date   Anginal pain (HCC)    history of 11 years ago. No meds at present.   Arthritis    Collagen vascular disease (HCC)    Heart disease    catheterization in remote past, medically managed   Hypercholesteremia    Hypertension    Sleep apnea      No Known Allergies   Current Outpatient Medications  Medication Sig Dispense Refill   amLODipine  (NORVASC ) 5 MG tablet Take 2 tablets (10 mg total) by mouth daily. 90 tablet 3   aspirin  EC 81 MG tablet Take 1 tablet (81 mg total) by mouth daily with breakfast. 30 tablet 11   cyclobenzaprine (FLEXERIL) 10 MG tablet Take 10 mg by mouth at  bedtime.     losartan (COZAAR) 25 MG tablet Take 1 tablet (25 mg total) by mouth daily. 90 tablet 3   MAGNESIUM PO Take 1 tablet by mouth See admin instructions. Take 1 tablet in the morning and 2 tablets every evening     meclizine  (ANTIVERT ) 25 MG tablet Take 1 tablet (25 mg total) by mouth 3 (three) times daily as needed for dizziness. 30 tablet 0   Milk Thistle 1000 MG CAPS Take 1 tablet by mouth every evening.     Multiple Vitamins-Minerals (MULTIVITAMIN WITH MINERALS) tablet Take 1 tablet by mouth daily.     Omega-3 Fatty Acids (FISH OIL PO) Take 1 capsule by mouth daily.     pantoprazole  (PROTONIX ) 40 MG tablet Take 1 tablet (40 mg total) by mouth daily. 90 tablet 3   rosuvastatin  (CRESTOR ) 20 MG tablet Take 1 tablet (20 mg total) by mouth daily. 90 tablet 3   traMADol  (ULTRAM ) 50 MG tablet Take 1 tablet (50 mg total) by mouth every 12 (twelve) hours as needed. 60 tablet 2   traZODone  (DESYREL ) 50 MG tablet Take 1 tablet (50 mg total) by mouth at bedtime. 90 tablet 3   Turmeric 400 MG CAPS Take 400 mg by mouth 2 (two) times  daily.      vitamin C (ASCORBIC ACID) 500 MG tablet Take 500 mg by mouth daily.     zinc gluconate 50 MG tablet Take 50 mg by mouth daily.     No current facility-administered medications for this visit.     Past Surgical History:  Procedure Laterality Date   APPENDECTOMY     CARDIAC CATHETERIZATION     CATARACT EXTRACTION W/PHACO Left 04/25/2016   Procedure: CATARACT EXTRACTION PHACO AND INTRAOCULAR LENS PLACEMENT (IOC);  Surgeon: Tarri Farm, MD;  Location: [facility];  Service: Ophthalmology;  Laterality: Left;  CDE 3.23   CATARACT EXTRACTION W/PHACO Right 05/23/2016   Procedure: CATARACT EXTRACTION PHACO AND INTRAOCULAR LENS PLACEMENT RIGHT EYE CDE=1.11;  Surgeon: Tarri Farm, MD;  Location: [facility];  Service: Ophthalmology;  Laterality: Right;   COLONOSCOPY N/A 10/09/2015   Procedure: COLONOSCOPY;  Surgeon: Suzette Espy, MD;  Location: [facility];   Service: Endoscopy;  Laterality: N/A;  1230 - pt can't come earlier - transportation issues   COLONOSCOPY WITH PROPOFOL  N/A 10/31/2022   Procedure: COLONOSCOPY WITH PROPOFOL ;  Surgeon: Suzette Espy, MD;  Location: [facility];  Service: Endoscopy;  Laterality: N/A;  1:00pm, asa 3   COLONOSCOPY WITH PROPOFOL  N/A 11/01/2022   Procedure: COLONOSCOPY WITH PROPOFOL ;  Surgeon: Urban Garden, MD;  Location: [facility];  Service: Gastroenterology;  Laterality: N/A;   dental implants     HEMOSTASIS CLIP PLACEMENT  10/31/2022   Procedure: HEMOSTASIS CLIP PLACEMENT;  Surgeon: Suzette Espy, MD;  Location: [facility];  Service: Endoscopy;;   POLYPECTOMY  10/31/2022   Procedure: POLYPECTOMY;  Surgeon: Suzette Espy, MD;  Location: [facility];  Service: Endoscopy;;     No Known Allergies    Family History  Problem Relation Age of Onset   Other Mother        Chron's Dz   Other Father        shot by wife, patient's step mother   Alcoholism Father    Hypertension Father    Other Sister        h/o substance abuse   Thyroid disease Sister    Hypertension Brother    Stroke Brother    Heart attack Paternal Grandfather    Prostate cancer Other        multiple family members on mother's side   Colon cancer Neg Hx      Social History Mr. Manning reports that he has quit smoking. His smoking use included pipe and cigarettes. He started smoking about 45 years ago. He has never used smokeless tobacco. Mr. Nickson reports current alcohol use of about 3.0 standard drinks of alcohol per week.    Physical Examination Today's Vitals   06/10/24 1439 06/10/24 1525  BP: (!) 164/104 (!) 155/85  Pulse: 84   SpO2: 97%   Weight: (!) 309 lb (140.2 kg)   Height: 6' 4 (1.93 m)    Body mass index is 37.61 kg/m.  Gen: resting comfortably, no acute distress HEENT: no scleral icterus, pupils equal round and reactive, no palptable cervical adenopathy,  CV: RRR,no mrg, no jvd Resp:  Clear to auscultation bilaterally GI: abdomen is soft, non-tender, non-distended, normal bowel sounds, no hepatosplenomegaly MSK: extremities are warm, no edema.  Skin: warm, no rash Neuro:  no focal deficits Psych: appropriate affect   Diagnostic Studies 01/2004 cath    CORONARY ARTERIOGRAPHY:  No calcification was seen on fluoroscopy.    1. Left main:  Normal.  2. LAD:  The LAD extended down to the apex of the heart and gave rise to one     large diagonal Eliyohu Class.  At the bifurcation of the LAD and the diagonal     was an area of approximately 50% narrowing that appeared to involve both     the LAD and the ostium of the first diagonal.  It was best seen in an LAO     cranial view.  Multiple angiograms were taken of this discrete area.     Intracoronary nitroglycerin  did not result in any change in the     appearance of this.  The remainder of the LAD and the diagonal were free     of disease.  3. Right coronary artery:  This was a large dominant vessel that was about 4     mm in diameter.  There was a large PDA and 2 large posterolateral     branches free of disease.  4. Circumflex:  The circumflex in the A-V groove was small after OM #2.  OM     #1 was a large vessel and free of disease.  OM #2 was small and free of     disease.    CONCLUSION:  1. Normal left ventricular systolic function.  2. Fifty percent area of narrowing in the midportion of the left anterior     descending that appears to involve both the left anterior descending and     the ostium of the first diagonal.    DISCUSSION:  My concern is whether or not the above-mentioned lesion is  actually significant or not.  It is in that gray zone and because of this, I  have ordered a stress Cardiolite study.  If it is clearly positive, then he  will need percutaneous intervention, which would be relatively high risk  because of the bifurcation involvement.  I discussed this with the patient  and the patient is  scheduled for a Cardiolite in the morning.    Because of his recurrent problems of back discomfort, he was given the  combination of Versed  and Nubain during the procedure with good relief of  his back pain.     10/2017 echo Study Conclusions   - Left ventricle: The cavity size was normal. Wall thickness was   increased in a pattern of mild LVH. Systolic function was normal.   The estimated ejection fraction was in the range of 60% to 65%.   Left ventricular diastolic function parameters were normal. - Aortic valve: Mildly calcified annulus. Normal thickness   leaflets. Valve area (VTI): 4.12 cm^2. Valve area (Vmax): 3.73   cm^2. Valve area (Vmean): 3.72 cm^2. - Mitral valve: There was mild regurgitation. - Atrial septum: No defect or patent foramen ovale was identified. - Technically difficult study.   10/2017 nuclear stress No diagnostic ST segment changes to indicate ischemia. No chest pain reported. Hypertensive response noted. Patient achieved 7.2 METS. Low risk Duke treadmill score of 6. Blood pressure demonstrated a hypertensive response to exercise. Medium, mild intensity, partially reversible apical inferior and basal inferoseptal as well as inferolateral defects that suggest mild ischemic territories, although noted in the presence of diaphragmatic attenuation. TID ratio borderline increased at 1.23. This is an intermediate risk study based on perfusion imaging. Nuclear stress EF: 51%.    Assessment and Plan  1. CAD -isolated episode of chest pain. Atypical in the sense it lasted 3.5 to 4 hours.  - negative evaluation in  the ER for ACS, CTA was also benign for acute aortic pathology - no recurrent symptoms, has been very physically active at work without exertional symptoms - monitor at this time, if recurrences would plan for coronary CTA  2. HTN - above goal, on max norvasc  - we will add losartan 25mg  daily, check bmet 2 weeks  3. HLD - reasonable control,  continue current meds   F/u 4 months     Laurann Pollock, M.D.

## 2024-06-10 NOTE — Patient Instructions (Addendum)
 Medication Instructions:    START Losartan 25 mg daily  Labwork: BMET in 2 weeks at [facility] (06/25/24)   Testing/Procedures: none  Follow-Up: 4 months at either [place]  or [place] office  Any Other Special Instructions Will Be Listed Below (If Applicable).  If you need a refill on your cardiac medications before your next appointment, please call your pharmacy.

## 2024-06-15 ENCOUNTER — Other Ambulatory Visit: Payer: Self-pay | Admitting: Family Medicine

## 2024-06-15 DIAGNOSIS — M539 Dorsopathy, unspecified: Secondary | ICD-10-CM

## 2024-06-16 ENCOUNTER — Encounter: Payer: Self-pay | Admitting: Family Medicine

## 2024-06-16 ENCOUNTER — Ambulatory Visit (INDEPENDENT_AMBULATORY_CARE_PROVIDER_SITE_OTHER): Admitting: Family Medicine

## 2024-06-16 VITALS — BP 127/66 | HR 77 | Ht 76.0 in | Wt 309.0 lb

## 2024-06-16 DIAGNOSIS — M539 Dorsopathy, unspecified: Secondary | ICD-10-CM | POA: Diagnosis not present

## 2024-06-16 DIAGNOSIS — I10 Essential (primary) hypertension: Secondary | ICD-10-CM

## 2024-06-16 DIAGNOSIS — E782 Mixed hyperlipidemia: Secondary | ICD-10-CM | POA: Diagnosis not present

## 2024-06-16 DIAGNOSIS — F411 Generalized anxiety disorder: Secondary | ICD-10-CM | POA: Diagnosis not present

## 2024-06-16 MED ORDER — TRAMADOL HCL 50 MG PO TABS: 50.0000 mg | ORAL_TABLET | Freq: Two times a day (BID) | ORAL | 2 refills | Status: DC | PRN

## 2024-06-16 MED ORDER — BUSPIRONE HCL 7.5 MG PO TABS: 7.5000 mg | ORAL_TABLET | Freq: Two times a day (BID) | ORAL | 3 refills | Status: DC

## 2024-06-16 NOTE — Progress Notes (Signed)
 BP 127/66   Pulse 77   Ht 6' 4 (1.93 m)   Wt (!) 309 lb (140.2 kg)   SpO2 98%   BMI 37.61 kg/m    Subjective:   Patient ID: Daniel Crane, male    DOB: 1961/04/24, 63 y.o.   MRN: [id number]  HPI: Daniel Crane is a 63 y.o. male presenting on 06/16/2024 for Medical Management of Chronic Issues, Hyperlipidemia, and Hypertension   HPI Hypertension Patient is currently on amlodipine  and losartan , and their blood pressure today is 127/76. Patient denies any lightheadedness . Patient denies headaches, blurred vision, chest pains, shortness of breath, or weakness. Denies any side effects from medication and is content with current medication.  Says he still fighting vertigo and dizziness and spinning sensation but not as much of the lightheadedness or feeling like he is in a pass out.  Patient is having a lot of anxiety and stress right now especially with his wife recently getting a knee fracture and surgery and him having some dizziness and vertigo and stress issues. He is wondering if there is something they can take to help out that he can take now for the anxiety.  Hyperlipidemia Patient is coming in for recheck of his hyperlipidemia. The patient is currently taking fish oils and Crestor . They deny any issues with myalgias or history of liver damage from it. They deny any focal numbness or weakness or chest pain.   Pain assessment: Cause of pain-degenerative disc disease lumbar Pain location-low back Pain on scale of 1-10- 5 Frequency-Daily What increases pain-standing and certain movements What makes pain Better-medication and rest Effects on ADL -minimal Any change in general medical condition-none  Current opioids rx-tramadol  50 mg twice daily as needed # meds rx-60 Effectiveness of current meds-works well Adverse reactions from pain meds-none Morphine  equivalent-10  Pill count performed-No Last drug screen -12/22/2023 ( high risk q80m, moderate risk q105m, low risk yearly  ) Urine drug screen today- No Was the NCCSR reviewed-yes  If yes were their any concerning findings? -None Pain contract signed on: 12/22/2023  Relevant past medical, surgical, family and social history reviewed and updated as indicated. Interim medical history since our last visit reviewed. Allergies and medications reviewed and updated.  Review of Systems  Constitutional:  Negative for chills and fever.  Eyes:  Negative for discharge and visual disturbance.  Respiratory:  Negative for shortness of breath and wheezing.   Cardiovascular:  Negative for chest pain and leg swelling.  Skin:  Negative for rash.  Psychiatric/Behavioral:  Positive for dysphoric mood and sleep disturbance. Negative for self-injury and suicidal ideas. The patient is nervous/anxious.   All other systems reviewed and are negative.   Per HPI unless specifically indicated above   Allergies as of 06/16/2024   No Known Allergies      Medication List        Accurate as of June 16, 2024  4:24 PM. If you have any questions, ask your nurse or doctor.          amLODipine  5 MG tablet Commonly known as: NORVASC  Take 2 tablets (10 mg total) by mouth daily.   ascorbic acid 500 MG tablet Commonly known as: VITAMIN C Take 500 mg by mouth daily.   aspirin  EC 81 MG tablet Take 1 tablet (81 mg total) by mouth daily with breakfast.   busPIRone 7.5 MG tablet Commonly known as: BUSPAR Take 1 tablet (7.5 mg total) by mouth 2 (two) times daily. Started by:  Khylei Wilms A Alleene Stoy   cyclobenzaprine 10 MG tablet Commonly known as: FLEXERIL Take 10 mg by mouth at bedtime.   FISH OIL PO Take 1 capsule by mouth daily.   losartan  25 MG tablet Commonly known as: COZAAR  Take 1 tablet (25 mg total) by mouth daily.   MAGNESIUM PO Take 1 tablet by mouth See admin instructions. Take 1 tablet in the morning and 2 tablets every evening   meclizine  25 MG tablet Commonly known as: ANTIVERT  Take 1 tablet (25 mg total) by  mouth 3 (three) times daily as needed for dizziness.   Milk Thistle 1000 MG Caps Take 1 tablet by mouth every evening.   multivitamin with minerals tablet Take 1 tablet by mouth daily.   pantoprazole  40 MG tablet Commonly known as: PROTONIX  Take 1 tablet (40 mg total) by mouth daily.   rosuvastatin  20 MG tablet Commonly known as: CRESTOR  Take 1 tablet (20 mg total) by mouth daily.   traMADol  50 MG tablet Commonly known as: ULTRAM  Take 1 tablet (50 mg total) by mouth every 12 (twelve) hours as needed.   traZODone  50 MG tablet Commonly known as: DESYREL  Take 1 tablet (50 mg total) by mouth at bedtime.   Turmeric 400 MG Caps Take 400 mg by mouth 2 (two) times daily.   zinc gluconate 50 MG tablet Take 50 mg by mouth daily.         Objective:   BP 127/66   Pulse 77   Ht 6' 4 (1.93 m)   Wt (!) 309 lb (140.2 kg)   SpO2 98%   BMI 37.61 kg/m   Wt Readings from Last 3 Encounters:  06/16/24 (!) 309 lb (140.2 kg)  06/10/24 (!) 309 lb (140.2 kg)  06/03/24 (!) 309 lb (140.2 kg)    Physical Exam Vitals and nursing note reviewed.  Constitutional:      General: He is not in acute distress.    Appearance: He is well-developed. He is not diaphoretic.   Eyes:     General: No scleral icterus.    Conjunctiva/sclera: Conjunctivae normal.   Neck:     Thyroid: No thyromegaly.   Cardiovascular:     Rate and Rhythm: Normal rate and regular rhythm.     Heart sounds: Normal heart sounds. No murmur heard. Pulmonary:     Effort: Pulmonary effort is normal. No respiratory distress.     Breath sounds: Normal breath sounds. No wheezing.   Musculoskeletal:        General: No swelling. Normal range of motion.     Cervical back: Neck supple.  Lymphadenopathy:     Cervical: No cervical adenopathy.   Skin:    General: Skin is warm and dry.     Findings: No rash.   Neurological:     Mental Status: He is alert and oriented to person, place, and time.     Coordination:  Coordination normal.   Psychiatric:        Behavior: Behavior normal.       Assessment & Plan:   Problem List Items Addressed This Visit       Cardiovascular and Mediastinum   Essential hypertension   Relevant Orders   CBC with Differential/Platelet   CMP14+EGFR   Lipid panel     Musculoskeletal and Integument   Multilevel degenerative disc disease   Relevant Medications   traMADol  (ULTRAM ) 50 MG tablet     Other   Mixed hyperlipidemia - Primary   Relevant Orders  CBC with Differential/Platelet   CMP14+EGFR   Lipid panel   Other Visit Diagnoses       Anxiety state       Relevant Medications   busPIRone (BUSPAR) 7.5 MG tablet       Will start buspirone, continue other medicines currently.  Blood pressure looks good today.  Think most of his dizziness is from vertigo which he knows and he is taking medicine and doing maneuvers to manage it.  No changes Follow up plan: Return in about 3 months (around 09/16/2024), or if symptoms worsen or fail to improve, for Hypertension and hyperlipidemia.  Counseling provided for all of the vaccine components Orders Placed This Encounter  Procedures   CBC with Differential/Platelet   CMP14+EGFR   Lipid panel    Jolyne Needs, MD [facility] 06/16/2024, 4:24 PM

## 2024-06-25 ENCOUNTER — Other Ambulatory Visit (HOSPITAL_COMMUNITY)
Admission: RE | Admit: 2024-06-25 | Discharge: 2024-06-25 | Disposition: A | Discharge status: Home / Self Care | Source: Ambulatory Visit | Attending: Cardiology | Admitting: Cardiology

## 2024-06-25 DIAGNOSIS — Z79899 Other long term (current) drug therapy: Secondary | ICD-10-CM | POA: Insufficient documentation

## 2024-06-25 LAB — BASIC METABOLIC PANEL WITH GFR
Anion gap: 13 (ref 5–15)
BUN: 15 mg/dL (ref 8–23)
CO2: 22 mmol/L (ref 22–32)
Calcium: 9.3 mg/dL (ref 8.9–10.3)
Chloride: 103 mmol/L (ref 98–111)
Creatinine, Ser: 0.83 mg/dL (ref 0.61–1.24)
GFR, Estimated: >60 mL/min (ref >60)
Glucose, Bld: 96 mg/dL (ref 70–99)
Potassium: 3.8 mmol/L (ref 3.5–5.1)
Sodium: 138 mmol/L (ref 135–145)

## 2024-07-20 ENCOUNTER — Ambulatory Visit: Payer: Self-pay | Admitting: Cardiology

## 2024-08-16 ENCOUNTER — Other Ambulatory Visit: Payer: Self-pay

## 2024-08-16 DIAGNOSIS — R7989 Other specified abnormal findings of blood chemistry: Secondary | ICD-10-CM

## 2024-08-16 DIAGNOSIS — K760 Fatty (change of) liver, not elsewhere classified: Secondary | ICD-10-CM

## 2024-08-26 ENCOUNTER — Encounter: Payer: Self-pay | Admitting: Gastroenterology

## 2024-09-02 ENCOUNTER — Ambulatory Visit: Payer: Self-pay | Admitting: Gastroenterology

## 2024-09-04 LAB — CBC WITH DIFFERENTIAL/PLATELET
Basophils Absolute: 0 x10E3/uL (ref 0.0–0.2)
Basos: 0 %
EOS (ABSOLUTE): 0.2 x10E3/uL (ref 0.0–0.4)
Eos: 2 %
Hematocrit: 44.9 % (ref 37.5–51.0)
Hemoglobin: 15 g/dL (ref 13.0–17.7)
Immature Grans (Abs): 0 x10E3/uL (ref 0.0–0.1)
Immature Granulocytes: 0 %
Lymphocytes Absolute: 2.5 x10E3/uL (ref 0.7–3.1)
Lymphs: 25 %
MCH: 31.9 pg (ref 26.6–33.0)
MCHC: 33.4 g/dL (ref 31.5–35.7)
MCV: 96 fL (ref 79–97)
Monocytes Absolute: 0.9 x10E3/uL (ref 0.1–0.9)
Monocytes: 9 %
Neutrophils Absolute: 6.1 x10E3/uL (ref 1.4–7.0)
Neutrophils: 64 %
Platelets: 280 x10E3/uL (ref 150–450)
RBC: 4.7 x10E6/uL (ref 4.14–5.80)
RDW: 13.3 % (ref 11.6–15.4)
WBC: 9.8 x10E3/uL (ref 3.4–10.8)

## 2024-09-04 LAB — COMPREHENSIVE METABOLIC PANEL WITH GFR
ALT: 48 IU/L — ABNORMAL HIGH (ref 0–44)
AST: 38 IU/L (ref 0–40)
Albumin: 4.6 g/dL (ref 3.9–4.9)
Alkaline Phosphatase: 89 IU/L (ref 44–121)
BUN/Creatinine Ratio: 14 (ref 10–24)
BUN: 12 mg/dL (ref 8–27)
Bilirubin Total: 0.6 mg/dL (ref 0.0–1.2)
CO2: 19 mmol/L — ABNORMAL LOW (ref 20–29)
Calcium: 9.5 mg/dL (ref 8.6–10.2)
Chloride: 103 mmol/L (ref 96–106)
Creatinine, Ser: 0.85 mg/dL (ref 0.76–1.27)
Globulin, Total: 2.9 g/dL (ref 1.5–4.5)
Glucose: 91 mg/dL (ref 70–99)
Potassium: 4.1 mmol/L (ref 3.5–5.2)
Sodium: 139 mmol/L (ref 134–144)
Total Protein: 7.5 g/dL (ref 6.0–8.5)
eGFR: 98 mL/min/1.73 (ref >59)

## 2024-09-04 LAB — ALPHA-1-ANTITRYPSIN PHENOTYP: A-1 Antitrypsin: 149 mg/dL (ref 101–187)

## 2024-09-04 LAB — PROTIME-INR
INR: 1 (ref 0.9–1.2)
Prothrombin Time: 10.8 s (ref 9.1–12.0)

## 2024-09-04 LAB — CERULOPLASMIN: Ceruloplasmin: 18.6 mg/dL (ref 16.0–31.0)

## 2024-09-06 ENCOUNTER — Other Ambulatory Visit: Payer: Self-pay

## 2024-09-06 ENCOUNTER — Other Ambulatory Visit: Payer: Self-pay | Admitting: *Deleted

## 2024-09-06 DIAGNOSIS — K760 Fatty (change of) liver, not elsewhere classified: Secondary | ICD-10-CM

## 2024-09-06 DIAGNOSIS — R7989 Other specified abnormal findings of blood chemistry: Secondary | ICD-10-CM

## 2024-09-14 ENCOUNTER — Ambulatory Visit (HOSPITAL_COMMUNITY)
Admission: RE | Admit: 2024-09-14 | Discharge: 2024-09-14 | Disposition: A | Discharge status: Home / Self Care | Source: Ambulatory Visit | Attending: Gastroenterology | Admitting: Gastroenterology

## 2024-09-14 DIAGNOSIS — R7989 Other specified abnormal findings of blood chemistry: Secondary | ICD-10-CM | POA: Insufficient documentation

## 2024-09-17 ENCOUNTER — Ambulatory Visit (INDEPENDENT_AMBULATORY_CARE_PROVIDER_SITE_OTHER): Admitting: Family Medicine

## 2024-09-17 ENCOUNTER — Encounter: Payer: Self-pay | Admitting: Family Medicine

## 2024-09-17 VITALS — BP 131/67 | HR 73 | Ht 76.0 in | Wt 315.0 lb

## 2024-09-17 DIAGNOSIS — M539 Dorsopathy, unspecified: Secondary | ICD-10-CM | POA: Diagnosis not present

## 2024-09-17 DIAGNOSIS — E782 Mixed hyperlipidemia: Secondary | ICD-10-CM | POA: Diagnosis not present

## 2024-09-17 DIAGNOSIS — I10 Essential (primary) hypertension: Secondary | ICD-10-CM

## 2024-09-17 LAB — LIPID PANEL

## 2024-09-17 MED ORDER — MECLIZINE HCL 25 MG PO TABS: 25.0000 mg | ORAL_TABLET | Freq: Three times a day (TID) | ORAL | 1 refills | Status: DC | PRN

## 2024-09-17 MED ORDER — TRAMADOL HCL 50 MG PO TABS
50.0000 mg | ORAL_TABLET | Freq: Two times a day (BID) | ORAL | 2 refills | Status: AC | PRN
Start: 2024-09-17 — End: ?

## 2024-09-17 MED ORDER — MECLIZINE HCL 25 MG PO TABS: 25.0000 mg | ORAL_TABLET | Freq: Three times a day (TID) | ORAL | 1 refills | Status: AC | PRN

## 2024-09-17 NOTE — Progress Notes (Signed)
 BP 131/67   Pulse 73   Ht 6' 4 (1.93 m)   Wt (!) 315 lb (142.9 kg)   SpO2 96%   BMI 38.34 kg/m    Subjective:   Patient ID: Daniel Crane, male    DOB: [birth year], 63 y.o.   MRN: [id number]  HPI: Daniel Crane is a 63 y.o. male presenting on 09/17/2024 for Medical Management of Chronic Issues, Hyperlipidemia, Hypertension, and Dizziness   Discussed the use of AI scribe software for clinical note transcription with the patient, who gave verbal consent to proceed.  History of Present Illness   Daniel Crane is a 62 year old male with hypertension, hyperlipidemia, and degenerative disc disease who presents for a recheck of chronic medical issues.  He is on amlodipine  and losartan  for hypertension. He notes that losartan  does not help during certain episodes.  For hyperlipidemia, he takes Crestor , which he has been instructed to cut in half. He states his cholesterol levels are 'staying perfect' with this regimen.  He has degenerative disc disease in the lumbar region and takes tramadol  50 mg twice daily as needed, which helps keep his back pain 'relatively down'. He also uses Voltaren  gel for his back pain and requires a refill.  He experiences anxiety and takes buspirone , which he reports is effective in keeping his anxiety 'calm all the way around'.  He had a recent liver scan due to elevated liver numbers, but no definitive diagnosis has been made. He has stopped taking Tylenol  as a precaution.  Socially, he works as a Curator, is involved in a Medical illustrator, and mentors a Customer service manager. He is also working on home improvement projects, including rebuilding a deck.          Relevant past medical, surgical, family and social history reviewed and updated as indicated. Interim medical history since our last visit reviewed. Allergies and medications reviewed and updated.  Review of Systems  Constitutional:  Negative for chills and fever.  Eyes:  Negative for visual  disturbance.  Respiratory:  Negative for shortness of breath and wheezing.   Cardiovascular:  Negative for chest pain and leg swelling.  Musculoskeletal:  Negative for back pain and gait problem.  Skin:  Negative for rash.  Neurological:  Negative for dizziness and light-headedness.  All other systems reviewed and are negative.   Per HPI unless specifically indicated above   Allergies as of 09/17/2024   No Known Allergies      Medication List        Accurate as of September 17, 2024  4:09 PM. If you have any questions, ask your nurse or doctor.          amLODipine  5 MG tablet Commonly known as: NORVASC  Take 2 tablets (10 mg total) by mouth daily.   ascorbic acid 500 MG tablet Commonly known as: VITAMIN C Take 500 mg by mouth daily.   aspirin  EC 81 MG tablet Take 1 tablet (81 mg total) by mouth daily with breakfast.   busPIRone  7.5 MG tablet Commonly known as: BUSPAR  Take 1 tablet (7.5 mg total) by mouth 2 (two) times daily.   cyclobenzaprine 10 MG tablet Commonly known as: FLEXERIL Take 10 mg by mouth at bedtime.   FISH OIL PO Take 1 capsule by mouth daily.   losartan  25 MG tablet Commonly known as: COZAAR  Take 1 tablet (25 mg total) by mouth daily.   MAGNESIUM PO Take 1 tablet by mouth See admin instructions. Take 1  tablet in the morning and 2 tablets every evening   meclizine  25 MG tablet Commonly known as: ANTIVERT  Take 1 tablet (25 mg total) by mouth 3 (three) times daily as needed for dizziness.   Milk Thistle 1000 MG Caps Take 1 tablet by mouth every evening.   multivitamin with minerals tablet Take 1 tablet by mouth daily.   pantoprazole  40 MG tablet Commonly known as: PROTONIX  Take 1 tablet (40 mg total) by mouth daily.   rosuvastatin  20 MG tablet Commonly known as: CRESTOR  Take 1 tablet (20 mg total) by mouth daily.   traMADol  50 MG tablet Commonly known as: ULTRAM  Take 1 tablet (50 mg total) by mouth every 12 (twelve) hours as  needed.   traZODone  50 MG tablet Commonly known as: DESYREL  Take 1 tablet (50 mg total) by mouth at bedtime.   Turmeric 400 MG Caps Take 400 mg by mouth 2 (two) times daily.   zinc gluconate 50 MG tablet Take 50 mg by mouth daily.         Objective:   BP 131/67   Pulse 73   Ht 6' 4 (1.93 m)   Wt (!) 315 lb (142.9 kg)   SpO2 96%   BMI 38.34 kg/m   Wt Readings from Last 3 Encounters:  09/17/24 (!) 315 lb (142.9 kg)  06/16/24 (!) 309 lb (140.2 kg)  06/10/24 (!) 309 lb (140.2 kg)    Physical Exam Physical Exam   VITALS: BP- 131/67 CHEST: Lungs clear to auscultation bilaterally. CARDIOVASCULAR: Heart sounds normal, regular rate and rhythm.         Assessment & Plan:   Problem List Items Addressed This Visit       Cardiovascular and Mediastinum   Essential hypertension   Relevant Orders   CBC with Differential/Platelet   CMP14+EGFR     Musculoskeletal and Integument   Multilevel degenerative disc disease   Relevant Medications   traMADol  (ULTRAM ) 50 MG tablet     Other   Mixed hyperlipidemia - Primary   Relevant Orders   CMP14+EGFR   Lipid panel      Degenerative disc disease, lumbar region Chronic lumbar pain managed with tramadol  and Voltaren  gel. Tramadol  effective. Voltaren  gel provides additional relief. Medication compliance confirmed. - Refill tramadol  50 mg BID PRN. - Refill Voltaren  gel.  Essential hypertension Blood pressure well-controlled with amlodipine  and losartan . - Continue amlodipine  and losartan  as prescribed.  Mixed hyperlipidemia Cholesterol levels well-controlled. Crestor  dosage halved, cholesterol remains stable. - Continue current dosage of Crestor .  Suspected early stage fatty liver disease Liver scan showed no significant findings. Liver enzyme levels slightly elevated. Continued surveillance necessary. - Continue regular liver scans and monitoring of liver enzyme levels.  Anxiety disorder Anxiety well-managed with  buspirone .          Follow up plan: Return in about 3 months (around 12/17/2024), or if symptoms worsen or fail to improve, for Pain management.  Counseling provided for all of the vaccine components Orders Placed This Encounter  Procedures   CBC with Differential/Platelet   CMP14+EGFR   Lipid panel    Fonda Levins, MD [facility] 09/17/2024, 4:09 PM

## 2024-09-18 ENCOUNTER — Ambulatory Visit: Payer: Self-pay | Admitting: Gastroenterology

## 2024-09-18 LAB — CBC WITH DIFFERENTIAL/PLATELET
Basophils Absolute: 0 x10E3/uL (ref 0.0–0.2)
Basos: 0 %
EOS (ABSOLUTE): 0.2 x10E3/uL (ref 0.0–0.4)
Eos: 3 %
Hematocrit: 42.6 % (ref 37.5–51.0)
Hemoglobin: 14.5 g/dL (ref 13.0–17.7)
Immature Grans (Abs): 0 x10E3/uL (ref 0.0–0.1)
Immature Granulocytes: 0 %
Lymphocytes Absolute: 1.9 x10E3/uL (ref 0.7–3.1)
Lymphs: 24 %
MCH: 32.5 pg (ref 26.6–33.0)
MCHC: 34 g/dL (ref 31.5–35.7)
MCV: 96 fL (ref 79–97)
Monocytes Absolute: 0.7 x10E3/uL (ref 0.1–0.9)
Monocytes: 9 %
Neutrophils Absolute: 5.2 x10E3/uL (ref 1.4–7.0)
Neutrophils: 64 %
Platelets: 251 x10E3/uL (ref 150–450)
RBC: 4.46 x10E6/uL (ref 4.14–5.80)
RDW: 13.2 % (ref 11.6–15.4)
WBC: 8.1 x10E3/uL (ref 3.4–10.8)

## 2024-09-18 LAB — CMP14+EGFR
ALT: 47 IU/L — ABNORMAL HIGH (ref 0–44)
AST: 38 IU/L (ref 0–40)
Albumin: 4.3 g/dL (ref 3.9–4.9)
Alkaline Phosphatase: 85 IU/L (ref 47–123)
BUN/Creatinine Ratio: 15 (ref 10–24)
BUN: 14 mg/dL (ref 8–27)
Bilirubin Total: 0.7 mg/dL (ref 0.0–1.2)
CO2: 21 mmol/L (ref 20–29)
Calcium: 9.3 mg/dL (ref 8.6–10.2)
Chloride: 104 mmol/L (ref 96–106)
Creatinine, Ser: 0.91 mg/dL (ref 0.76–1.27)
Globulin, Total: 2.8 g/dL (ref 1.5–4.5)
Glucose: 91 mg/dL (ref 70–99)
Potassium: 4 mmol/L (ref 3.5–5.2)
Sodium: 140 mmol/L (ref 134–144)
Total Protein: 7.1 g/dL (ref 6.0–8.5)
eGFR: 95 mL/min/1.73 (ref >59)

## 2024-09-18 LAB — LIPID PANEL
Chol/HDL Ratio: 3 ratio (ref 0.0–5.0)
Cholesterol, Total: 142 mg/dL (ref 100–199)
HDL: 48 mg/dL (ref >39)
LDL Chol Calc (NIH): 78 mg/dL (ref 0–99)
Triglycerides: 83 mg/dL (ref 0–149)
VLDL Cholesterol Cal: 16 mg/dL (ref 5–40)

## 2024-09-20 ENCOUNTER — Other Ambulatory Visit: Payer: Self-pay

## 2024-09-20 DIAGNOSIS — K760 Fatty (change of) liver, not elsewhere classified: Secondary | ICD-10-CM

## 2024-09-22 ENCOUNTER — Ambulatory Visit: Payer: Self-pay | Admitting: Family Medicine

## 2024-10-05 ENCOUNTER — Other Ambulatory Visit: Payer: Self-pay | Admitting: Family Medicine

## 2024-10-05 DIAGNOSIS — F411 Generalized anxiety disorder: Secondary | ICD-10-CM

## 2024-10-13 ENCOUNTER — Encounter (INDEPENDENT_AMBULATORY_CARE_PROVIDER_SITE_OTHER): Payer: Self-pay | Admitting: Gastroenterology

## 2024-10-13 ENCOUNTER — Ambulatory Visit: Attending: Student | Admitting: Student

## 2024-10-13 ENCOUNTER — Encounter: Payer: Self-pay | Admitting: Student

## 2024-10-13 VITALS — BP 122/78 | HR 77 | Ht 76.0 in | Wt 312.4 lb

## 2024-10-13 DIAGNOSIS — I2510 Atherosclerotic heart disease of native coronary artery without angina pectoris: Secondary | ICD-10-CM | POA: Diagnosis not present

## 2024-10-13 DIAGNOSIS — N529 Male erectile dysfunction, unspecified: Secondary | ICD-10-CM

## 2024-10-13 DIAGNOSIS — E785 Hyperlipidemia, unspecified: Secondary | ICD-10-CM | POA: Diagnosis not present

## 2024-10-13 DIAGNOSIS — I10 Essential (primary) hypertension: Secondary | ICD-10-CM

## 2024-10-13 NOTE — Progress Notes (Signed)
 Cardiology Office Note    Date:  10/13/2024  ID:  [name], [name] 1961-01-13, MRN [id number] Cardiologist: Alvan Carrier, MD Cardiology APP:  Johnson Laymon [name], PA-C { :  History of Present Illness:    Daniel Crane is a 63 y.o. male with past medical history of CAD (catheterization in 2005 showing 50% LAD/D1 stenosis, NST in 10/2017 showing a mild area of ischemia and low to intermediate risk with medical management pursued), HTN, HLD and OSA who presents to the office today for 63-month follow-up.  He was last examined by Dr. Alvan in 05/2024 and had recently been evaluated the ED for chest pain during which troponin values had been negative but BP was elevated and Amlodipine  was titrated. His pain was overall felt to be atypical for angina and given that he was very active without recurrent symptoms, further testing was not pursued. Was recommended that if he had recurrent pain, would plan for a Coronary CTA. BP remained above goal at the time of his visit and he was started on Losartan  25 mg daily.  In talking with the patient today, he reports still having occasional episodes of vertigo. Says he can turn his head in a certain direction and symptoms can start. Does take Meclizine  with improvement. No specific dizziness with changing positions from lying down to sitting or sitting to standing. 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 He denies any recent chest pain or progressive dyspnea on exertion. No recent orthopnea, PND or pitting edema. Says that he does snore at night but is not interested in a repeat sleep study or possible CPAP given concerns for claustrophobia with this. He does report having issues with erectile dysfunction and questions if this is possibly due to Losartan .    Studies Reviewed:   EKG: EKG is not ordered today.  Echocardiogram: 10/2017 Study Conclusions   - Left ventricle: The cavity size was  normal. Wall thickness was    increased in a pattern of mild LVH. Systolic function was normal.    The estimated ejection fraction was in the range of 60% to 65%.    Left ventricular diastolic function parameters were normal.  - Aortic valve: Mildly calcified annulus. Normal thickness    leaflets. Valve area (VTI): 4.12 cm^2. Valve area (Vmax): 3.73    cm^2. Valve area (Vmean): 3.72 cm^2.  - Mitral valve: There was mild regurgitation.  - Atrial septum: No defect or patent foramen ovale was identified.  - Technically difficult study.   NST: 10/2017 No diagnostic ST segment changes to indicate ischemia. No chest pain reported. Hypertensive response noted. Patient achieved 7.2 METS. Low risk Duke treadmill score of 6. Blood pressure demonstrated a hypertensive response to exercise. Medium, mild intensity, partially reversible apical inferior and basal inferoseptal as well as inferolateral defects that suggest mild ischemic territories, although noted in the presence of diaphragmatic attenuation. TID ratio borderline increased at 1.23. This is an intermediate risk study based on perfusion imaging. Nuclear stress EF: 51%.  Physical Exam:   VS:  BP 122/78   Pulse 77   Ht 6' 4 (1.93 m)   Wt (!) 312 lb 6.4 oz (141.7 kg)   SpO2 97%   BMI 38.03 kg/m    Wt Readings from Last 3 Encounters:  10/13/24 (!) 312 lb 6.4 oz (141.7 kg)  09/17/24 (!) 315 lb (142.9 kg)  06/16/24 (!) 309 lb (140.2 kg)     GEN:  Pleasant male appearing in no acute distress NECK: No JVD; No carotid bruits CARDIAC: RRR, no murmurs, rubs, gallops RESPIRATORY:  Clear to auscultation without rales, wheezing or rhonchi  ABDOMEN: Appears non-distended. No obvious abdominal masses. EXTREMITIES: No clubbing or cyanosis. No pitting edema.  Distal pedal pulses are 2+ bilaterally.   Assessment and Plan:   1. Coronary artery disease involving native coronary artery of native heart without angina pectoris - He has a history of  documented CAD by prior cardiac catheterization in 2005 and most recent ischemic evaluation was an NST in 10/2017 which showed mild ischemia and medical management was pursued given no recurrent symptoms. - He denies any recent anginal symptoms. Encouraged him to make us  aware if he develops any chest pain or progressive dyspnea on exertion as we could arrange for a Coronary CTA for further evaluation. - Continue current medical therapy with ASA 81 mg daily and Crestor  20 mg daily.  2. Essential hypertension - BP was initially recorded at 140/84, rechecked and improved to 122/78. He reports this has been well-controlled when checked at home. Continue current medical therapy with Amlodipine  10 mg daily and Losartan  25 mg daily. He is unsure if some of his blood pressure medications are contributing to dizziness and we reviewed that he could divide out his Amlodipine  to 5 mg twice daily since he has 5 mg tablets. Based off his description of symptoms, I feel that his dizziness is due to vertigo as compared to orthostatic hypotension.  3. Hyperlipidemia LDL goal <70 - FLP in 08/2024 showed total cholesterol 142, triglycerides 83, HDL 48 and LDL 78. Continue current medical therapy for now with Crestor  20 mg daily, if LDL remains above goal, this can be further titrated to 40 mg daily.  4.  Erectile dysfunction - He reports worsening symptoms since Losartan  was initiated but also reports being under increased stress and feels like this could be contributing to symptoms as well. Reviewed that this would be a less common side effect of the medication but he could try reducing Losartan  to 12.5 mg daily to see if symptoms improve. If improvement, I encouraged him to make us  aware as we could switch to a different medication. Of note, he previously has significant fatigue with beta-blocker therapy and suspect this would cause worsening ED symptoms as well. Could perhaps switch to a different ARB or ACE-I to see if  better tolerated. - At this time, he is not interested in medications for ED and plans to explore alternative options such as wave therapy.  Signed, Laymon CHRISTELLA Qua, PA-C

## 2024-10-13 NOTE — Patient Instructions (Addendum)
 Medication Instructions:  Your physician recommends that you continue on your current medications as directed. Please refer to the Current Medication list given to you today.  Try dividing out Amlodipine  (Norvasc ) dosing to 5mg  twice daily instead of taking as once daily.  *If you need a refill on your cardiac medications before your next appointment, please call your pharmacy*  Lab Work: None  If you have labs (blood work) drawn today and your tests are completely normal, you will receive your results only by: MyChart Message (if you have MyChart) OR A paper copy in the mail If you have any lab test that is abnormal or we need to change your treatment, we will call you to review the results.  Testing/Procedures: None  Follow-Up: At [facility], you and your health needs are our priority.  As part of our continuing mission to provide you with exceptional heart care, our providers are all part of one team.  This team includes your primary Cardiologist (physician) and Advanced Practice Providers or APPs (Physician Assistants and Nurse Practitioners) who all work together to provide you with the care you need, when you need it.  Your next appointment:   6 month(s)  Provider:   You may see Alvan Carrier, MD or one of the following Advanced Practice Providers on your designated Care Team:   Laymon Qua, PA-C  [place], [place] Olivia [place], [place]     We recommend signing up for the patient portal called MyChart.  Sign up information is provided on this After Visit Summary.  MyChart is used to connect with patients for Virtual Visits (Telemedicine).  Patients are able to view lab/test results, encounter notes, upcoming appointments, etc.  Non-urgent messages can be sent to your provider as well.   To learn more about what you can do with MyChart, go to [email or web address].   Other Instructions

## 2024-11-29 ENCOUNTER — Other Ambulatory Visit: Payer: Self-pay

## 2024-11-29 DIAGNOSIS — K760 Fatty (change of) liver, not elsewhere classified: Secondary | ICD-10-CM

## 2024-12-17 ENCOUNTER — Ambulatory Visit: Payer: Self-pay | Admitting: Family Medicine

## 2024-12-17 VITALS — BP 135/71 | HR 80 | Ht 76.0 in | Wt 319.0 lb

## 2024-12-17 DIAGNOSIS — F411 Generalized anxiety disorder: Secondary | ICD-10-CM

## 2024-12-17 DIAGNOSIS — M539 Dorsopathy, unspecified: Secondary | ICD-10-CM | POA: Diagnosis not present

## 2024-12-17 DIAGNOSIS — Z79899 Other long term (current) drug therapy: Secondary | ICD-10-CM

## 2024-12-17 DIAGNOSIS — M25511 Pain in right shoulder: Secondary | ICD-10-CM

## 2024-12-17 DIAGNOSIS — E782 Mixed hyperlipidemia: Secondary | ICD-10-CM | POA: Diagnosis not present

## 2024-12-17 DIAGNOSIS — I10 Essential (primary) hypertension: Secondary | ICD-10-CM

## 2024-12-17 MED ORDER — BUSPIRONE HCL 7.5 MG PO TABS: 7.5000 mg | ORAL_TABLET | Freq: Two times a day (BID) | ORAL | 3 refills | Status: AC

## 2024-12-17 MED ORDER — MUPIROCIN 2 % EX OINT: 1.0000 | TOPICAL_OINTMENT | Freq: Two times a day (BID) | CUTANEOUS | 2 refills | Status: AC

## 2024-12-17 MED ORDER — TRAMADOL HCL 50 MG PO TABS: 50.0000 mg | ORAL_TABLET | Freq: Two times a day (BID) | ORAL | 2 refills | Status: AC | PRN

## 2024-12-17 MED ORDER — DICLOFENAC SODIUM 1 % EX GEL: 2.0000 g | Freq: Four times a day (QID) | CUTANEOUS | 3 refills | Status: DC

## 2024-12-17 NOTE — Progress Notes (Signed)
 "  BP 135/71   Pulse 80   Ht 6' 4 (1.93 m)   Wt (!) 319 lb (144.7 kg)   SpO2 97%   BMI 38.83 kg/m    Subjective:   Patient ID: Daniel Crane, male    DOB: November 27, 1961, 63 y.o.   MRN: [id number]  HPI: Daniel Crane is a 63 y.o. male presenting on 12/17/2024 for Medical Management of Chronic Issues, degenerative disc disease, and Hypertension   Discussed the use of AI scribe software for clinical note transcription with the patient, who gave verbal consent to proceed.  History of Present Illness   Daniel Crane is a 63 year old male who presents for a recheck of blood pressure and refills for tramadol  for back pain.  Chronic back pain - Chronic pain exacerbated by recent cold weather - Pain also affects ankle during cold weather - Pain is persistent and managed with tramadol  - Pain is described as something he 'just deals with'  Arthritis symptoms and management - Uses Voltaren  cream for arthritis - Nearly out of Voltaren  cream  Post-procedural bleeding - History of post-procedure bleeding - Discontinued certain oral medications due to bleeding  Foot lesion secondary to spider bite - History of spider bite on foot during COVID-19 pandemic - Prescribed topical ointment from this office, which prevented itching and breakouts - Unable to recall name of ointment          Relevant past medical, surgical, family and social history reviewed and updated as indicated. Interim medical history since our last visit reviewed. Allergies and medications reviewed and updated.  Review of Systems  Constitutional:  Negative for chills and fever.  Respiratory:  Negative for shortness of breath and wheezing.   Cardiovascular:  Negative for chest pain and leg swelling.  Musculoskeletal:  Positive for arthralgias, back pain and myalgias. Negative for gait problem and joint swelling.  Skin:  Negative for rash.  All other systems reviewed and are negative.   Per HPI unless specifically  indicated above   Allergies as of 12/17/2024   No Known Allergies      Medication List        Accurate as of December 17, 2024  4:26 PM. If you have any questions, ask your nurse or doctor.          amLODipine  5 MG tablet Commonly known as: NORVASC  Take 2 tablets (10 mg total) by mouth daily.   ascorbic acid 500 MG tablet Commonly known as: VITAMIN C Take 500 mg by mouth daily.   aspirin  EC 81 MG tablet Take 1 tablet (81 mg total) by mouth daily with breakfast.   busPIRone  7.5 MG tablet Commonly known as: BUSPAR  Take 1 tablet (7.5 mg total) by mouth 2 (two) times daily.   cyclobenzaprine 10 MG tablet Commonly known as: FLEXERIL Take 10 mg by mouth at bedtime.   diclofenac  Sodium 1 % Gel Commonly known as: Voltaren  Apply 2 g topically 4 (four) times daily. Started by: Fonda Levins, MD   FISH OIL PO Take 1 capsule by mouth daily.   losartan  25 MG tablet Commonly known as: COZAAR  Take 1 tablet (25 mg total) by mouth daily.   MAGNESIUM PO Take 1 tablet by mouth See admin instructions. Take 1 tablet in the morning and 2 tablets every evening   meclizine  25 MG tablet Commonly known as: ANTIVERT  Take 1 tablet (25 mg total) by mouth 3 (three) times daily as needed for dizziness.   Milk Thistle  1000 MG Caps Take 1 tablet by mouth every evening.   multivitamin with minerals tablet Take 1 tablet by mouth daily.   mupirocin  ointment 2 % Commonly known as: BACTROBAN  Apply 1 Application topically 2 (two) times daily. Started by: Fonda Levins, MD   pantoprazole  40 MG tablet Commonly known as: PROTONIX  Take 1 tablet (40 mg total) by mouth daily.   rosuvastatin  20 MG tablet Commonly known as: CRESTOR  Take 1 tablet (20 mg total) by mouth daily.   traMADol  50 MG tablet Commonly known as: ULTRAM  Take 1 tablet (50 mg total) by mouth every 12 (twelve) hours as needed.   traZODone  50 MG tablet Commonly known as: DESYREL  Take 1 tablet (50 mg total) by  mouth at bedtime.   Turmeric 400 MG Caps Take 400 mg by mouth 2 (two) times daily.   zinc gluconate 50 MG tablet Take 50 mg by mouth daily.         Objective:   BP 135/71   Pulse 80   Ht 6' 4 (1.93 m)   Wt (!) 319 lb (144.7 kg)   SpO2 97%   BMI 38.83 kg/m   Wt Readings from Last 3 Encounters:  12/17/24 (!) 319 lb (144.7 kg)  10/13/24 (!) 312 lb 6.4 oz (141.7 kg)  09/17/24 (!) 315 lb (142.9 kg)    Physical Exam Physical Exam   VITALS: BP- 135/71 NECK: Thyroid normal. CHEST: Lungs clear to auscultation bilaterally. CARDIOVASCULAR: Heart regular rate and rhythm.         Assessment & Plan:   Problem List Items Addressed This Visit       Cardiovascular and Mediastinum   Essential hypertension     Musculoskeletal and Integument   Multilevel degenerative disc disease   Relevant Medications   traMADol  (ULTRAM ) 50 MG tablet     Other   Mixed hyperlipidemia   Other Visit Diagnoses       Controlled substance agreement signed    -  Primary   Relevant Orders   ToxASSURE Select 13 (MW), Urine     Anxiety state       Relevant Medications   busPIRone  (BUSPAR ) 7.5 MG tablet     Acute pain of right shoulder       Relevant Medications   diclofenac  Sodium (VOLTAREN ) 1 % GEL          Multilevel degenerative disc disease with chronic pain Chronic back pain worsened by cold weather. Diclofenac  tablets discontinued due to post-procedure bleeding. - Refilled tramadol  prescription. - Prescribed diclofenac  cream.  Essential hypertension Blood pressure controlled at 135/71 mmHg.  Mixed hyperlipidemia Cholesterol levels well-managed.  History of spider bite with residual skin lesion Residual skin lesion from previous spider bite. Mupirocin  ointment was effective. - Prescribed mupirocin  ointment.  General Health Maintenance Discussed hand hygiene importance during holiday season and cooking. - Advised on hand hygiene practices.          Follow up  plan: Return in about 3 months (around 03/17/2025), or if symptoms worsen or fail to improve, for Degenerative disc disease and hypertension.  Counseling provided for all of the vaccine components Orders Placed This Encounter  Procedures   ToxASSURE Select 13 (MW), Urine    Fonda Levins, MD [facility] 12/17/2024, 4:26 PM     "

## 2024-12-19 ENCOUNTER — Ambulatory Visit: Payer: Self-pay | Admitting: Gastroenterology

## 2024-12-22 LAB — COMPREHENSIVE METABOLIC PANEL WITH GFR
ALT: 65 IU/L — ABNORMAL HIGH (ref 0–44)
AST: 45 IU/L — ABNORMAL HIGH (ref 0–40)
Albumin: 4.5 g/dL (ref 3.9–4.9)
Alkaline Phosphatase: 84 IU/L (ref 47–123)
BUN/Creatinine Ratio: 15 (ref 10–24)
BUN: 14 mg/dL (ref 8–27)
Bilirubin Total: 0.6 mg/dL (ref 0.0–1.2)
CO2: 24 mmol/L (ref 20–29)
Calcium: 9.4 mg/dL (ref 8.6–10.2)
Chloride: 102 mmol/L (ref 96–106)
Creatinine, Ser: 0.94 mg/dL (ref 0.76–1.27)
Globulin, Total: 2.7 g/dL (ref 1.5–4.5)
Glucose: 91 mg/dL (ref 70–99)
Potassium: 4.3 mmol/L (ref 3.5–5.2)
Sodium: 140 mmol/L (ref 134–144)
Total Protein: 7.2 g/dL (ref 6.0–8.5)
eGFR: 91 mL/min/1.73

## 2024-12-22 LAB — CBC WITH DIFFERENTIAL/PLATELET
Basophils Absolute: 0.1 x10E3/uL (ref 0.0–0.2)
Basos: 1 %
EOS (ABSOLUTE): 0.3 x10E3/uL (ref 0.0–0.4)
Eos: 4 %
Hematocrit: 44.1 % (ref 37.5–51.0)
Hemoglobin: 14.9 g/dL (ref 13.0–17.7)
Immature Grans (Abs): 0 x10E3/uL (ref 0.0–0.1)
Immature Granulocytes: 0 %
Lymphocytes Absolute: 2.1 x10E3/uL (ref 0.7–3.1)
Lymphs: 24 %
MCH: 32.4 pg (ref 26.6–33.0)
MCHC: 33.8 g/dL (ref 31.5–35.7)
MCV: 96 fL (ref 79–97)
Monocytes Absolute: 0.8 x10E3/uL (ref 0.1–0.9)
Monocytes: 10 %
Neutrophils Absolute: 5.2 x10E3/uL (ref 1.4–7.0)
Neutrophils: 61 %
Platelets: 293 x10E3/uL (ref 150–450)
RBC: 4.6 x10E6/uL (ref 4.14–5.80)
RDW: 13.1 % (ref 11.6–15.4)
WBC: 8.5 x10E3/uL (ref 3.4–10.8)

## 2024-12-22 LAB — ENHANCED LIVER FIBROSIS (ELF): ELF(TM) Score: 9.83 — ABNORMAL HIGH

## 2024-12-22 LAB — TOXASSURE SELECT 13 (MW), URINE

## 2024-12-27 NOTE — Addendum Note (Signed)
 Addended by: WELLINGTON MILLING on: 12/27/2024 08:59 AM   Modules accepted: Orders

## 2024-12-31 ENCOUNTER — Ambulatory Visit: Payer: Self-pay | Admitting: Family Medicine

## 2025-01-04 ENCOUNTER — Emergency Department (HOSPITAL_COMMUNITY)
Admission: EM | Admit: 2025-01-04 | Discharge: 2025-01-04 | Disposition: A | Discharge status: Home / Self Care | Payer: Worker's Compensation | Attending: Emergency Medicine | Admitting: Emergency Medicine

## 2025-01-04 ENCOUNTER — Emergency Department (HOSPITAL_COMMUNITY): Payer: Worker's Compensation

## 2025-01-04 ENCOUNTER — Other Ambulatory Visit: Payer: Self-pay

## 2025-01-04 ENCOUNTER — Encounter (HOSPITAL_COMMUNITY): Payer: Self-pay | Admitting: *Deleted

## 2025-01-04 DIAGNOSIS — Z7982 Long term (current) use of aspirin: Secondary | ICD-10-CM | POA: Diagnosis not present

## 2025-01-04 DIAGNOSIS — M25562 Pain in left knee: Secondary | ICD-10-CM | POA: Diagnosis present

## 2025-01-04 DIAGNOSIS — I10 Essential (primary) hypertension: Secondary | ICD-10-CM | POA: Insufficient documentation

## 2025-01-04 DIAGNOSIS — Y990 Civilian activity done for income or pay: Secondary | ICD-10-CM | POA: Insufficient documentation

## 2025-01-04 DIAGNOSIS — M25511 Pain in right shoulder: Secondary | ICD-10-CM

## 2025-01-04 DIAGNOSIS — X501XXA Overexertion from prolonged static or awkward postures, initial encounter: Secondary | ICD-10-CM | POA: Insufficient documentation

## 2025-01-04 MED ORDER — DICLOFENAC SODIUM 1 % EX GEL: 2.0000 g | Freq: Four times a day (QID) | CUTANEOUS | 3 refills | Status: AC

## 2025-01-04 MED ORDER — HYDROCODONE-ACETAMINOPHEN 5-325 MG PO TABS
1.0000 | ORAL_TABLET | Freq: Once | ORAL | Status: AC
  Administered 2025-01-04: 1 via ORAL
  Filled 2025-01-04: qty 1

## 2025-01-04 MED ORDER — HYDROCODONE-ACETAMINOPHEN 5-325 MG PO TABS: 1.0000 | ORAL_TABLET | Freq: Four times a day (QID) | ORAL | 0 refills | Status: AC | PRN

## 2025-01-04 NOTE — ED Provider Notes (Signed)
 " [place] EMERGENCY DEPARTMENT AT [facility] Provider Note   CSN: [id number] Arrival date & time: 01/04/25  1120     Patient presents with: Knee Injury   Daniel Crane is a 64 y.o. male.  He has history of hyperlipidemia, hypertension, fatty liver.  Presents to ER today for left knee pain.  He states while he was at work today he was leaning forward while on a stepstool trying to reach something using a wrench and when he slipped, bending his left knee backwards and has had pain in the front of the knee since that time, not able to bear full weight or ambulate.  No numbness or tingling.   HPI     Prior to Admission medications  Medication Sig Start Date End Date Taking? Authorizing Provider  HYDROcodone -acetaminophen  (NORCO/VICODIN) 5-325 MG tablet Take 1 tablet by mouth every 6 (six) hours as needed. 01/04/25  Yes Aniylah Avans A, PA-C  amLODipine  (NORVASC ) 5 MG tablet Take 2 tablets (10 mg total) by mouth daily. 06/03/24   Franklyn Sid SAILOR, MD  aspirin  EC 81 MG tablet Take 1 tablet (81 mg total) by mouth daily with breakfast. 11/02/22   Pearlean Manus, MD  busPIRone  (BUSPAR ) 7.5 MG tablet Take 1 tablet (7.5 mg total) by mouth 2 (two) times daily. 12/17/24   Dettinger, Fonda LABOR, MD  cyclobenzaprine (FLEXERIL) 10 MG tablet Take 10 mg by mouth at bedtime.    [provider]  diclofenac  Sodium (VOLTAREN ) 1 % GEL Apply 2 g topically 4 (four) times daily. 01/04/25   Suellen Cantor A, PA-C  losartan  (COZAAR ) 25 MG tablet Take 1 tablet (25 mg total) by mouth daily. 06/10/24 12/17/24  Alvan Dorn FALCON, MD  MAGNESIUM PO Take 1 tablet by mouth See admin instructions. Take 1 tablet in the morning and 2 tablets every evening    [provider]  meclizine  (ANTIVERT ) 25 MG tablet Take 1 tablet (25 mg total) by mouth 3 (three) times daily as needed for dizziness. 09/17/24   Dettinger, Fonda LABOR, MD  Milk Thistle 1000 MG CAPS Take 1 tablet by mouth every evening.    [provider]  Multiple Vitamins-Minerals (MULTIVITAMIN WITH MINERALS) tablet Take 1 tablet by mouth daily.    [provider]  mupirocin  ointment (BACTROBAN ) 2 % Apply 1 Application topically 2 (two) times daily. 12/17/24   Dettinger, Fonda LABOR, MD  Omega-3 Fatty Acids (FISH OIL PO) Take 1 capsule by mouth daily.    [provider]  pantoprazole  (PROTONIX ) 40 MG tablet Take 1 tablet (40 mg total) by mouth daily. 03/12/24   Dettinger, Fonda LABOR, MD  rosuvastatin  (CRESTOR ) 20 MG tablet Take 1 tablet (20 mg total) by mouth daily. 03/12/24   Dettinger, Fonda LABOR, MD  traMADol  (ULTRAM ) 50 MG tablet Take 1 tablet (50 mg total) by mouth every 12 (twelve) hours as needed. 12/17/24   Dettinger, Fonda LABOR, MD  traZODone  (DESYREL ) 50 MG tablet Take 1 tablet (50 mg total) by mouth at bedtime. 03/12/24   Dettinger, Fonda LABOR, MD  Turmeric 400 MG CAPS Take 400 mg by mouth 2 (two) times daily.     [provider]  vitamin C (ASCORBIC ACID) 500 MG tablet Take 500 mg by mouth daily.    [provider]  zinc gluconate 50 MG tablet Take 50 mg by mouth daily.    [provider]    Allergies: Patient has no known allergies.    Review of Systems  Updated Vital Signs BP (!) 147/83   Pulse 75   Temp 98.1 F (36.7 C) (Oral)   Resp 18   Ht 6' 4 (1.93 m)   Wt (!) 144.7 kg   SpO2 98%   BMI 38.83 kg/m   Physical Exam Vitals and nursing note reviewed.  Constitutional:      General: He is not in acute distress.    Appearance: He is well-developed.  HENT:     Head: Normocephalic and atraumatic.  Eyes:     Conjunctiva/sclera: Conjunctivae normal.  Cardiovascular:     Rate and Rhythm: Normal rate and regular rhythm.     Heart sounds: No murmur heard. Pulmonary:     Effort: Pulmonary effort is normal. No respiratory distress.     Breath sounds: Normal breath sounds.  Abdominal:     Palpations: Abdomen is soft.     Tenderness: There is no abdominal tenderness.   Musculoskeletal:        General: Tenderness present. No swelling.     Cervical back: Neck supple.     Right lower leg: No edema.     Left lower leg: No edema.     Comments: Patient can only actively flex to about 45 degrees, otherwise limited to pain, he can extend fully and lift his leg off the bed without difficulty.  There is no defect, patella is not high riding, there is tenderness over the anterior knee with no crepitus.  Distal pulses are intact.  Skin:    General: Skin is warm and dry.     Capillary Refill: Capillary refill takes less than 2 seconds.  Neurological:     General: No focal deficit present.     Mental Status: He is alert and oriented to person, place, and time.     Sensory: No sensory deficit.  Psychiatric:        Mood and Affect: Mood normal.     (all labs ordered are listed, but only abnormal results are displayed) Labs Reviewed - No data to display  EKG: None  Radiology: DG Knee Complete 4 Views Left Result Date: 01/04/2025 EXAM: 4 VIEW(S) XRAY OF THE LEFT KNEE 01/04/2025 11:58:00 AM COMPARISON: None available. CLINICAL HISTORY: s/p injury FINDINGS: BONES AND JOINTS: No acute fracture. No malalignment. No significant joint effusion. SOFT TISSUES: The soft tissues are unremarkable. IMPRESSION: 1. No acute fracture or dislocation. Electronically signed by: [name] MD 01/04/2025 12:21 PM EST RP Workstation: [id number]     Procedures   Medications Ordered in the ED  HYDROcodone -acetaminophen  (NORCO/VICODIN) 5-325 MG per tablet 1 tablet (1 tablet Oral Given 01/04/25 1347)                                    Medical Decision Making Differential diagnosis includes but limited to fracture, dislocation, sprain, strain, other  ED course: Patient presents to ER for left knee pain after hyper extending the knee today.  He has pain with minimal swelling, no fracture or malalignment or significant effusion on x-ray.  Distal pulses are intact, he is not able to  bear full weight or ambulate, no sign of quadriceps or patellar tendon rupture.  Discussed with patient he may have meniscal tear or internal derangement otherwise, will give knee immobilizer, crutches and analgesics for pain, advised on orthopedic follow-up.  Amount and/or Complexity of Data Reviewed Radiology: ordered and independent interpretation performed.    Details: Fracture or  dislocation noted on left knee x-ray.  Agree with radiology read.  Risk Prescription drug management.        Final diagnoses:  Acute pain of left knee    ED Discharge Orders          Ordered    HYDROcodone -acetaminophen  (NORCO/VICODIN) 5-325 MG tablet  Every 6 hours PRN        01/04/25 1427    diclofenac  Sodium (VOLTAREN ) 1 % GEL  4 times daily        01/04/25 [address] PA-C 01/04/25 1925    Suzette Pac, MD 01/09/25 1111  "

## 2025-01-04 NOTE — ED Triage Notes (Signed)
 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

## 2025-01-04 NOTE — Discharge Instructions (Addendum)
 You are seen the ER today for left knee injury.  Fortunately your x-ray did not show any broken bones or dislocation.  You were given Norco for when your pain is more severe.  When taking this, do not take your tramadol .  You can use a topical diclofenac  gel and over-the-counter ibuprofen as directed on packaging.  Come back to the ER for new or worsening symptoms.  With orthopedics, use the knee immobilizer during the day, crutches as needed for walking.

## 2025-01-04 NOTE — ED Notes (Signed)
 Pt refuses need for crutches as he has some already at home, per patient and spouse.

## 2025-01-05 ENCOUNTER — Ambulatory Visit (INDEPENDENT_AMBULATORY_CARE_PROVIDER_SITE_OTHER): Payer: Worker's Compensation | Admitting: Orthopedic Surgery

## 2025-01-05 ENCOUNTER — Encounter: Payer: Self-pay | Admitting: Orthopedic Surgery

## 2025-01-05 DIAGNOSIS — M25562 Pain in left knee: Secondary | ICD-10-CM

## 2025-01-05 MED ORDER — METHYLPREDNISOLONE ACETATE 40 MG/ML IJ SUSP
40.0000 mg | Freq: Once | INTRAMUSCULAR | Status: AC
  Administered 2025-01-05: 40 mg via INTRA_ARTICULAR

## 2025-01-05 NOTE — Progress Notes (Signed)
 New Patient Visit  Summary: Daniel Crane is a 64 y.o. male with the following: Acute left knee pain   Assessment & Plan Left knee sprain with effusion Acute right knee sprain with effusion post-hyperextension, likely soft tissue injury with possible intra-articular involvement. Radiographs negative for fracture or dislocation. Effusion causing pain and functional limitation. - Aspiration and administered intra-articular corticosteroid injection. - Advised rest of right knee for the week. - Discussed optional knee brace use; no immobilization required. - Conditional return-to-work: resume if symptoms improve; follow up in two weeks if no improvement.  Procedure note - aspiration and injection left knee joint   Verbal consent was obtained to aspirate the left knee joint  Timeout was completed to confirm the site of aspiration. The skin was prepped with alcohol and ethyl chloride was sprayed at the aspiration site.  An 18-gauge needle was inserted and 5 cc of bloody joint fluid was aspirated using a superolateral approach.  With the same needle, 40 mg of Depo-Medrol , as well as 5 cc of 1% lidocaine  was injected There were no complications. A sterile bandage was applied.    Follow-up: Return in about 2 weeks (around 01/19/2025).  Subjective:  Chief Complaint  Patient presents with   Knee Pain    L after leaning into a vehicle while fixing it and slipped states his boot didn't move hyperextending the knee.      Discussed the use of AI scribe software for clinical note transcription with the patient, who gave verbal consent to proceed.  History of Present Illness Daniel Crane is a 64 year old male who presents with acute right knee pain and swelling following a hyperextension injury sustained at work yesterday.  Yesterday morning at work he slipped from a stepstool, hyperextended his right knee, and struck it against the front of a truck, with immediate pain similar to a prior  football injury.  He initially continued working but developed progressive difficulty weight-bearing with visible swelling and went to the emergency department.  Pain is localized to the anterior knee, worse superiorly with extension and inferiorly with flexion, and is aggravated by both full extension and flexion. He cannot fully bear weight on the right leg and notes significant swelling. He denies other knee pain, locking, catching, or instability.  He reports prior right knee injuries and owns two knee braces. He has not taken medication for this injury. Prior knee radiographs showed no fracture or dislocation.    Review of Systems: No fevers or chills No numbness or tingling No chest pain No shortness of breath No bowel or bladder dysfunction No GI distress No headaches   Medical History:  Past Medical History:  Diagnosis Date   Anginal pain    history of 11 years ago. No meds at present.   Arthritis    Collagen vascular disease    Heart disease    catheterization in remote past, medically managed   Hypercholesteremia    Hypertension    Sleep apnea     Past Surgical History:  Procedure Laterality Date   APPENDECTOMY     CARDIAC CATHETERIZATION     CATARACT EXTRACTION W/PHACO Left 04/25/2016   Procedure: CATARACT EXTRACTION PHACO AND INTRAOCULAR LENS PLACEMENT (IOC);  Surgeon: Lynwood Hermann, MD;  Location: [facility];  Service: Ophthalmology;  Laterality: Left;  CDE 3.23   CATARACT EXTRACTION W/PHACO Right 05/23/2016   Procedure: CATARACT EXTRACTION PHACO AND INTRAOCULAR LENS PLACEMENT RIGHT EYE CDE=1.11;  Surgeon: Lynwood Hermann, MD;  Location: [facility];  Service: Ophthalmology;  Laterality: Right;   COLONOSCOPY N/A 10/09/2015   Procedure: COLONOSCOPY;  Surgeon: Lamar CHRISTELLA Hollingshead, MD;  Location: [facility];  Service: Endoscopy;  Laterality: N/A;  1230 - pt can't come earlier - transportation issues   COLONOSCOPY WITH PROPOFOL  N/A 10/31/2022   Procedure: COLONOSCOPY WITH  PROPOFOL ;  Surgeon: Hollingshead Lamar CHRISTELLA, MD;  Location: [facility];  Service: Endoscopy;  Laterality: N/A;  1:00pm, asa 3   COLONOSCOPY WITH PROPOFOL  N/A 11/01/2022   Procedure: COLONOSCOPY WITH PROPOFOL ;  Surgeon: Eartha Angelia Sieving, MD;  Location: [facility];  Service: Gastroenterology;  Laterality: N/A;   dental implants     EYE SURGERY     Cataract   HEMOSTASIS CLIP PLACEMENT  10/31/2022   Procedure: HEMOSTASIS CLIP PLACEMENT;  Surgeon: Hollingshead Lamar CHRISTELLA, MD;  Location: [facility];  Service: Endoscopy;;   POLYPECTOMY  10/31/2022   Procedure: POLYPECTOMY;  Surgeon: Hollingshead Lamar CHRISTELLA, MD;  Location: [facility];  Service: Endoscopy;;    Family History  Problem Relation Age of Onset   Other Mother        Chron's Dz   Other Father        shot by wife, patient's step mother   Alcoholism Father    Hypertension Father    Other Sister        h/o substance abuse   Thyroid disease Sister    Hypertension Brother    Stroke Brother    Heart attack Paternal Grandfather    Prostate cancer Other        multiple family members on mother's side   Colon cancer Neg Hx    Social History[1]  Allergies[2]  Active Medications[3]  Objective: There were no vitals taken for this visit.  Physical Exam:    General: Alert and oriented. and No acute distress. Gait: Left sided antalgic gait.  Physical Exam MUSCULOSKELETAL: Swelling in the knee with unclear etiology.  No bruising.  No redness.  Range of motion is restricted due to pain.  No increased laxity varus or valgus stress.  Negative Lachman.  He is guarding.   IMAGING: I personally reviewed images previously obtained from the ED  X-rays of the left knee were obtained in the emergency department.  No acute injuries are noted.     New Medications:  Meds ordered this encounter  Medications   methylPREDNISolone  acetate (DEPO-MEDROL ) injection 40 mg      Portions of this note were completed via Herbalist.  Oneil DELENA Horde, MD  01/07/2025 8:34 AM      [1]  Social History Tobacco Use   Smoking status: Former    Types: Pipe, Cigarettes    Start date: 12/15/1978   Smokeless tobacco: Never   Tobacco comments:    quit smoking cigarettes age 53 & started smoking a pipe when he was in his mid 48's  Vaping Use   Vaping status: Never Used  Substance Use Topics   Alcohol use: Yes    Alcohol/week: 3.0 standard drinks of alcohol    Types: 1 Glasses of wine, 2 Cans of beer per week    Comment: 2-3 times per week    Drug use: No  [2] No Known Allergies [3]  Current Meds  Medication Sig   amLODipine  (NORVASC ) 5 MG tablet Take 2 tablets (10 mg total) by mouth daily.   aspirin  EC 81 MG tablet Take 1 tablet (81 mg total) by mouth daily with breakfast.   busPIRone  (  BUSPAR ) 7.5 MG tablet Take 1 tablet (7.5 mg total) by mouth 2 (two) times daily.   cyclobenzaprine (FLEXERIL) 10 MG tablet Take 10 mg by mouth at bedtime.   diclofenac  Sodium (VOLTAREN ) 1 % GEL Apply 2 g topically 4 (four) times daily.   HYDROcodone -acetaminophen  (NORCO/VICODIN) 5-325 MG tablet Take 1 tablet by mouth every 6 (six) hours as needed.   losartan  (COZAAR ) 25 MG tablet Take 1 tablet (25 mg total) by mouth daily.   MAGNESIUM PO Take 1 tablet by mouth See admin instructions. Take 1 tablet in the morning and 2 tablets every evening   meclizine  (ANTIVERT ) 25 MG tablet Take 1 tablet (25 mg total) by mouth 3 (three) times daily as needed for dizziness.   Milk Thistle 1000 MG CAPS Take 1 tablet by mouth every evening.   Multiple Vitamins-Minerals (MULTIVITAMIN WITH MINERALS) tablet Take 1 tablet by mouth daily.   mupirocin  ointment (BACTROBAN ) 2 % Apply 1 Application topically 2 (two) times daily.   Omega-3 Fatty Acids (FISH OIL PO) Take 1 capsule by mouth daily.   pantoprazole  (PROTONIX ) 40 MG tablet Take 1 tablet (40 mg total) by mouth daily.   rosuvastatin  (CRESTOR ) 20 MG tablet Take 1 tablet (20 mg total) by mouth  daily.   traMADol  (ULTRAM ) 50 MG tablet Take 1 tablet (50 mg total) by mouth every 12 (twelve) hours as needed.   traZODone  (DESYREL ) 50 MG tablet Take 1 tablet (50 mg total) by mouth at bedtime.   Turmeric 400 MG CAPS Take 400 mg by mouth 2 (two) times daily.    vitamin C (ASCORBIC ACID) 500 MG tablet Take 500 mg by mouth daily.   zinc gluconate 50 MG tablet Take 50 mg by mouth daily.

## 2025-01-05 NOTE — Patient Instructions (Signed)
 Note for work - out for the next 2 weeks.   If your knee improves and you are able to return to work sooner, please contact the clinic.   Brace or ACE wrap as needed  Use walker as needed

## 2025-01-07 ENCOUNTER — Encounter: Payer: Self-pay | Admitting: Orthopedic Surgery

## 2025-01-09 ENCOUNTER — Telehealth: Payer: Self-pay | Admitting: Orthopedic Surgery

## 2025-01-09 NOTE — Telephone Encounter (Signed)
 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  Please advise.

## 2025-01-10 ENCOUNTER — Encounter: Payer: Self-pay | Admitting: Orthopedic Surgery

## 2025-01-10 NOTE — Telephone Encounter (Signed)
 I have typed the note and sent it via mychart per the pt's request.

## 2025-01-19 ENCOUNTER — Encounter: Payer: Self-pay | Admitting: Orthopedic Surgery

## 2025-01-19 ENCOUNTER — Ambulatory Visit: Payer: Worker's Compensation | Admitting: Orthopedic Surgery

## 2025-01-19 DIAGNOSIS — M25562 Pain in left knee: Secondary | ICD-10-CM

## 2025-01-19 NOTE — Progress Notes (Signed)
 Return Patient Visit  Summary: Daniel Crane is a 64 y.o. male with the following: Acute left knee pain   Assessment & Plan Left knee sprain with effusion Acute right knee sprain with effusion post-hyperextension.  Symptoms improved quickly.  He continues to wear compressive wrap, as well as a brace, which is effective.  He is not taking medications.  He feels much better.  He is already returned to work.  Anticipate gradual improvement in his symptoms.  If not, he will return to clinic.     Follow-up: Return if symptoms worsen or fail to improve.  Subjective:  Chief Complaint  Patient presents with   Knee Pain    Left- doing pretty ggod wears brace 90% of the day     Discussed the use of AI scribe software for clinical note transcription with the patient, who gave verbal consent to proceed.  History of Present Illness Daniel Crane is a 64 year old male who presents with acute right knee pain and swelling following a hyperextension injury sustained at work 2 weeks ago.  He injured his left knee couple weeks ago.  Left knee was aspirated and injected.  He had rapid improvement of the symptoms.  He returned to work a few days after I saw him.  He is complaining of pain in the superior and lateral aspect of the left knee.  He wears an Ace wrap, as well as a brace most of the day.  He feels much better.    Review of Systems: No fevers or chills No numbness or tingling No chest pain No shortness of breath No bowel or bladder dysfunction No GI distress No headaches       Objective: There were no vitals taken for this visit.  Physical Exam:    General: Alert and oriented. and No acute distress. Gait: Left sided antalgic gait.  Physical Exam MUSCULOSKELETAL: He is ambulating much better.  Minimal swelling in the left knee.  Knee is stable to varus and valgus stress.  He has pain in the superior and lateral aspect of the left knee.  This is in the lateral area of  the quadriceps tendon.  There is no bruising.  No swelling of the left knee.   IMAGING: No new imaging obtained today     New Medications:  No orders of the defined types were placed in this encounter.     Portions of this note were completed via Scientist, clinical (histocompatibility and immunogenetics).  Oneil DELENA Horde, MD  01/19/2025 8:52 AM

## 2025-01-21 LAB — COPPER, SERUM: Copper: 86 ug/dL (ref 66–121)

## 2025-01-24 ENCOUNTER — Ambulatory Visit: Payer: Self-pay | Admitting: Gastroenterology
